# Patient Record
Sex: Female | Born: 1945
Health system: Southern US, Community
[De-identification: ages and names within clinical notes are randomized; demographics above are authoritative.]

## PROBLEM LIST (undated history)

## (undated) DIAGNOSIS — I10 Essential (primary) hypertension: Secondary | ICD-10-CM

## (undated) DIAGNOSIS — T7840XA Allergy, unspecified, initial encounter: Secondary | ICD-10-CM

## (undated) DIAGNOSIS — G709 Myoneural disorder, unspecified: Secondary | ICD-10-CM

## (undated) DIAGNOSIS — M4800 Spinal stenosis, site unspecified: Secondary | ICD-10-CM

## (undated) DIAGNOSIS — J45909 Unspecified asthma, uncomplicated: Secondary | ICD-10-CM

## (undated) DIAGNOSIS — J449 Chronic obstructive pulmonary disease, unspecified: Secondary | ICD-10-CM

## (undated) DIAGNOSIS — J189 Pneumonia, unspecified organism: Secondary | ICD-10-CM

## (undated) DIAGNOSIS — N200 Calculus of kidney: Secondary | ICD-10-CM

## (undated) HISTORY — PX: BLADDER SUSPENSION: SHX72

## (undated) HISTORY — PX: ABDOMINAL HYSTERECTOMY: SHX81

## (undated) HISTORY — PX: SALPINGOOPHORECTOMY: SHX82

## (undated) HISTORY — DX: Allergy, unspecified, initial encounter: T78.40XA

## (undated) HISTORY — PX: BACK SURGERY: SHX140

## (undated) HISTORY — PX: TONSILLECTOMY: SUR1361

---

## 1999-01-11 ENCOUNTER — Other Ambulatory Visit: Admission: RE | Admit: 1999-01-11 | Discharge: 1999-01-11 | Payer: Self-pay | Admitting: Obstetrics and Gynecology

## 1999-03-22 ENCOUNTER — Other Ambulatory Visit: Admission: RE | Admit: 1999-03-22 | Discharge: 1999-03-22 | Payer: Self-pay | Admitting: Obstetrics and Gynecology

## 1999-07-07 ENCOUNTER — Encounter: Payer: Self-pay | Admitting: Urology

## 1999-07-07 ENCOUNTER — Ambulatory Visit (HOSPITAL_COMMUNITY): Admission: RE | Admit: 1999-07-07 | Discharge: 1999-07-08 | Payer: Self-pay | Admitting: Urology

## 2001-01-10 ENCOUNTER — Encounter: Payer: Self-pay | Admitting: Obstetrics and Gynecology

## 2001-01-10 ENCOUNTER — Encounter: Admission: RE | Admit: 2001-01-10 | Discharge: 2001-01-10 | Payer: Self-pay | Admitting: Obstetrics and Gynecology

## 2001-12-11 ENCOUNTER — Encounter: Admission: RE | Admit: 2001-12-11 | Discharge: 2001-12-11 | Payer: Self-pay | Admitting: Internal Medicine

## 2001-12-11 ENCOUNTER — Encounter: Payer: Self-pay | Admitting: Internal Medicine

## 2001-12-17 ENCOUNTER — Encounter: Admission: RE | Admit: 2001-12-17 | Discharge: 2001-12-17 | Payer: Self-pay | Admitting: Internal Medicine

## 2001-12-17 ENCOUNTER — Encounter: Payer: Self-pay | Admitting: Internal Medicine

## 2002-01-28 ENCOUNTER — Encounter: Payer: Self-pay | Admitting: Obstetrics and Gynecology

## 2002-01-28 ENCOUNTER — Ambulatory Visit (HOSPITAL_COMMUNITY): Admission: RE | Admit: 2002-01-28 | Discharge: 2002-01-28 | Payer: Self-pay | Admitting: Obstetrics and Gynecology

## 2002-03-05 ENCOUNTER — Ambulatory Visit (HOSPITAL_COMMUNITY): Admission: RE | Admit: 2002-03-05 | Discharge: 2002-03-05 | Payer: Self-pay | Admitting: Internal Medicine

## 2002-03-11 ENCOUNTER — Encounter (INDEPENDENT_AMBULATORY_CARE_PROVIDER_SITE_OTHER): Payer: Self-pay | Admitting: Specialist

## 2002-03-11 ENCOUNTER — Observation Stay (HOSPITAL_COMMUNITY): Admission: RE | Admit: 2002-03-11 | Discharge: 2002-03-13 | Payer: Self-pay | Admitting: Obstetrics and Gynecology

## 2002-03-12 ENCOUNTER — Encounter (INDEPENDENT_AMBULATORY_CARE_PROVIDER_SITE_OTHER): Payer: Self-pay | Admitting: Specialist

## 2003-02-05 ENCOUNTER — Encounter: Admission: RE | Admit: 2003-02-05 | Discharge: 2003-02-05 | Payer: Self-pay | Admitting: Obstetrics and Gynecology

## 2003-02-12 ENCOUNTER — Encounter: Admission: RE | Admit: 2003-02-12 | Discharge: 2003-02-12 | Payer: Self-pay | Admitting: Internal Medicine

## 2003-03-10 ENCOUNTER — Encounter: Admission: RE | Admit: 2003-03-10 | Discharge: 2003-03-10 | Payer: Self-pay | Admitting: Urology

## 2003-03-12 ENCOUNTER — Ambulatory Visit (HOSPITAL_BASED_OUTPATIENT_CLINIC_OR_DEPARTMENT_OTHER): Admission: RE | Admit: 2003-03-12 | Discharge: 2003-03-12 | Payer: Self-pay | Admitting: Urology

## 2003-03-12 ENCOUNTER — Ambulatory Visit (HOSPITAL_COMMUNITY): Admission: RE | Admit: 2003-03-12 | Discharge: 2003-03-12 | Payer: Self-pay | Admitting: Urology

## 2004-01-13 ENCOUNTER — Other Ambulatory Visit: Admission: RE | Admit: 2004-01-13 | Discharge: 2004-01-13 | Payer: Self-pay | Admitting: Obstetrics and Gynecology

## 2004-02-24 ENCOUNTER — Encounter: Admission: RE | Admit: 2004-02-24 | Discharge: 2004-02-24 | Payer: Self-pay | Admitting: Internal Medicine

## 2005-03-09 ENCOUNTER — Other Ambulatory Visit: Admission: RE | Admit: 2005-03-09 | Discharge: 2005-03-09 | Payer: Self-pay | Admitting: Obstetrics and Gynecology

## 2005-04-27 ENCOUNTER — Encounter: Admission: RE | Admit: 2005-04-27 | Discharge: 2005-04-27 | Payer: Self-pay | Admitting: Obstetrics and Gynecology

## 2006-02-08 ENCOUNTER — Ambulatory Visit (HOSPITAL_BASED_OUTPATIENT_CLINIC_OR_DEPARTMENT_OTHER): Admission: RE | Admit: 2006-02-08 | Discharge: 2006-02-08 | Payer: Self-pay | Admitting: Otolaryngology

## 2006-03-14 ENCOUNTER — Other Ambulatory Visit: Admission: RE | Admit: 2006-03-14 | Discharge: 2006-03-14 | Payer: Self-pay | Admitting: Obstetrics and Gynecology

## 2006-05-02 ENCOUNTER — Encounter: Admission: RE | Admit: 2006-05-02 | Discharge: 2006-05-02 | Payer: Self-pay | Admitting: Obstetrics and Gynecology

## 2006-05-16 ENCOUNTER — Encounter: Admission: RE | Admit: 2006-05-16 | Discharge: 2006-05-16 | Payer: Self-pay | Admitting: Obstetrics and Gynecology

## 2006-09-27 ENCOUNTER — Encounter: Admission: RE | Admit: 2006-09-27 | Discharge: 2006-09-27 | Payer: Self-pay | Admitting: Internal Medicine

## 2006-10-26 ENCOUNTER — Encounter: Admission: RE | Admit: 2006-10-26 | Discharge: 2006-10-26 | Payer: Self-pay | Admitting: Internal Medicine

## 2007-03-27 ENCOUNTER — Other Ambulatory Visit: Admission: RE | Admit: 2007-03-27 | Discharge: 2007-03-27 | Payer: Self-pay | Admitting: Obstetrics and Gynecology

## 2007-05-15 ENCOUNTER — Encounter: Admission: RE | Admit: 2007-05-15 | Discharge: 2007-05-15 | Payer: Self-pay | Admitting: Obstetrics and Gynecology

## 2008-01-19 ENCOUNTER — Encounter: Admission: RE | Admit: 2008-01-19 | Discharge: 2008-01-19 | Payer: Self-pay | Admitting: Neurosurgery

## 2008-04-01 ENCOUNTER — Other Ambulatory Visit: Admission: RE | Admit: 2008-04-01 | Discharge: 2008-04-01 | Payer: Self-pay | Admitting: Obstetrics and Gynecology

## 2008-06-05 ENCOUNTER — Encounter: Admission: RE | Admit: 2008-06-05 | Discharge: 2008-06-05 | Payer: Self-pay | Admitting: Family Medicine

## 2008-06-16 ENCOUNTER — Encounter: Admission: RE | Admit: 2008-06-16 | Discharge: 2008-06-16 | Payer: Self-pay | Admitting: Family Medicine

## 2008-08-05 ENCOUNTER — Ambulatory Visit (HOSPITAL_BASED_OUTPATIENT_CLINIC_OR_DEPARTMENT_OTHER): Admission: RE | Admit: 2008-08-05 | Discharge: 2008-08-05 | Payer: Self-pay | Admitting: Family Medicine

## 2008-08-05 ENCOUNTER — Ambulatory Visit: Payer: Self-pay | Admitting: Diagnostic Radiology

## 2009-07-14 ENCOUNTER — Encounter: Admission: RE | Admit: 2009-07-14 | Discharge: 2009-07-14 | Payer: Self-pay | Admitting: Family Medicine

## 2010-04-07 ENCOUNTER — Other Ambulatory Visit
Admission: RE | Admit: 2010-04-07 | Discharge: 2010-04-07 | Payer: Self-pay | Source: Home / Self Care | Admitting: Obstetrics and Gynecology

## 2010-04-16 ENCOUNTER — Encounter: Payer: Self-pay | Admitting: Internal Medicine

## 2010-04-17 ENCOUNTER — Encounter: Payer: Self-pay | Admitting: Obstetrics and Gynecology

## 2010-04-17 ENCOUNTER — Encounter: Payer: Self-pay | Admitting: Family Medicine

## 2010-06-16 ENCOUNTER — Other Ambulatory Visit: Payer: Self-pay | Admitting: Family Medicine

## 2010-06-16 DIAGNOSIS — Z1231 Encounter for screening mammogram for malignant neoplasm of breast: Secondary | ICD-10-CM

## 2010-07-20 ENCOUNTER — Ambulatory Visit: Payer: Self-pay

## 2010-08-12 NOTE — Op Note (Signed)
NAME:  Joanne Holmes, Joanne Holmes                       ACCOUNT NO.:  [id number]   MEDICAL RECORD NO.:  [id number]                   PATIENT TYPE:  INP   LOCATION:  0009                                 FACILITY:  [facility]   PHYSICIAN:  Sander Radon, M.D.                 DATE OF BIRTH:  1945-12-26   DATE OF PROCEDURE:  03/11/2002  DATE OF DISCHARGE:                                 OPERATIVE REPORT   PREOPERATIVE DIAGNOSES:  Right complex and cystic ovarian mass with a normal  CA 125 which was found on repeat  ultrasound to be slightly smaller.   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 Extensive pelvic and peritoneal adhesions and extensive  adhesions of the omentum to the bladder.   SURGEON:  Sander Radon, M.D.   ASSISTANT:  Deidre Ala, M.D.   ESTIMATED BLOOD LOSS:  250 cc   DRAINS:  Foley.   ANESTHESIA:  General endotracheal.   COMPLICATIONS:  None.   FLUIDS:  3000 cc of crystalloid and 400 mg IV Tequin preoperative antibiotic  per Dr. Virgina Jock.   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   DESCRIPTION OF PROCEDURE:  The patient was brought to the operating room and  placed on the operating room table. After induction of adequate general  endotracheal anesthesia, the patient was placed in the supine position and  prepped and draped in the usual sterile fashion, a Foley catheter was  placed. A Pfannenstiel incision was made and carried down to the fascia. I  was unable to use the patient's previous incision as it was just above the  pubic symphysis and too low for the surgery. The incision was carried down  to the fascia. The fascia was scored in the midline and extended bilaterally  using Mayo scissors. It was then separated free from the underlying muscles.  The muscles  appeared to have been tied together in the midline at the  patient's hysterectomy and the muscles were gently separated to the  symphysis. The patient was subsequently placed in Trendelenburg and the  peritoneum was entered very carefully and tediously taking care to avoid  bowel or other abdominal contents. The peritoneal incision was extended  superiorly and then inferiorly down to the bladder edge using very careful  sharp dissection. There were noted to be extensive omental adhesions to the  peritoneal surfaces tracking over to the lateral sidewalls and tracking down  into the pelvis. The patient did give a history only at her preop visit of  having some type of bladder tumor which was found at the time of her  hysterectomy. Subsequently the washings were taken the peritoneal surfaces  were noted to be smooth. There were no enlarged pelvic repair or  aortic  nodes. The O'Connor-O'Sullivan retractor was placed, the bladder blade was  placed and the bowel was packed out of the operative field. The right ovary  was noted to be densely adhesed to the pelvic sidewall and to the omental  adhesions and very careful lysis of adhesions was performed. The ovary was  subsequently mobilized and we were able to isolate the infundibulopelvic  ligament. The ovary was clamped at the infundibulopelvic ligament very close  to the ovary well away from any area of the ureter. It was then separated  from the infundibulopelvic ligament, tied with a free tie and a suture  ligature of #0 Vicryl. Excellent hemostasis was noted at this pedicle and at  all surfaces where the ovary had been mobilized using both sharp and blunt  dissection. It was then sent for frozen section and frozen section returned  as a [name] tumor. It should be noted that the right tube was noted to be  normal and sent with the ovary. The left ovary was identified and this took  very careful and tedious dissection such that we had to go  around the ovary  until adhesions but subsequently were able to isolate the very small ovary.  It too was adhesed and using sharp and blunt dissection was mobilized. To  mobilize both tubes and ovaries required very tedious and careful  dissection. Subsequently the infundibulopelvic ligament on the left was  isolated, clamped with a curved Heaney, cut, tied with a free tie and a  suture ligature of #0 Monocryl. There was noted to be some generalized  oozing across the peritoneal and omental surfaces and excellent hemostasis  was achieved using cautery. The pelvis was copiously irrigated with warm  lactated Ringer's and the infundibulopelvic ligament as well as areas from  which the ovaries had been mobilized from the peritoneal surfaces are noted  to be extremely hemostatic. This appeared to be over the omental adhesed  area and excellent hemostasis was achieved using cautery. Great care was  taken to make sure that cautery was well away from any bowel loops. There  was noted to be one area where the fascia had a small rent in it at the left  distal pubic region and this was repaired using a figure-of-eight suture of  #0 Vicryl. A bladder retractor was noted to have caused a small rent in the  peritoneal surface of the bladder but not into any other portion of the  bladder just your one superficial layer and I approximated this using a  simple running interlocking suture of 2-0 Vicryl. It should be noted that  very careful inspection revealed that there had been no compromise of the  bladder integrity and in fact great care was taken to ensure that no portion  of the bladder was entered. Again the pelvis was irrigated copiously with  warm lactated Ringers, we inspected all the pedicles again and they were  noted to be extremely hemostatic. In fact, the irrigant was noted to be very  clear and excellent hemostasis was noted even from the raw surfaces. At that point, the patient was taken out  of Trendelenburg, Kelly clamps were placed  on the peritoneum after the O'Connor-O'Sullivan retractor, bladder blade and  abdominal wall retractor as well as bowel packs were removed. The peritoneum  was closed using a simple running suture of 2-0 Vicryl. Excellent closure  was noted. Subsequently, the fascia was closed using two sutures of #0 PDS  with each suture anchored  at either angle incision, run to the midline and  tied. Excellent fascial closure was assured. The subcutaneous tissue was  irrigated copiously with warm lactated Ringers and excellent hemostasis was  achieved using cautery. The skin was closed with staples and Steri-Strips  were applied. The patient tolerated the procedure well without apparent  complications and was transferred to the recovery room in stable condition  after all instrument, sponge, and needle counts were correct.                                               Sander Radon, M.D.    DC/MEDQ  D:  03/11/2002  T:  03/12/2002  Job:  [id number]   cc:   Deidre Ala, M.D.  [address]  [place]  [place] [address]  Fax: [phone]   Precious Reel, M.D.  [address]  [place]  [place] [address]  Fax: [phone]

## 2010-08-12 NOTE — H&P (Signed)
NAME:  Joanne Holmes, Joanne Holmes                       ACCOUNT NO.:  [id number]   MEDICAL RECORD NO.:  [id number]                   PATIENT TYPE:   LOCATION:                                       FACILITY:   PHYSICIAN:  Sander Radon, M.D.                 DATE OF BIRTH:  [birth year]   DATE OF ADMISSION:  03/11/2002  DATE OF DISCHARGE:                                HISTORY & PHYSICAL   HISTORY OF PRESENT ILLNESS:  The patient is a 65 year old, Caucasian female,  G1, P1 who was found to have a complex right adnexal mass.  Evidently, she  was being worked up by Dr. Virgina Jock for severe uncontrolled hypertension.  She  had a CT angiography of the abdomen which showed no evidence of renal artery  stenosis.  However, the right ovary was found to be enlarged and completely  visualized on the exam.  Subsequently, she underwent a pelvic ultrasound  which showed the right ovary to be lateral and anterior.  It had a complex  cystic lesion measuring 5.1 x 3.9 x 5.3 cm.  There is a solid-appearing  component to the lesion medially.  The left ovary could not be identified  transabdominally.  The right ovary and cystic lesion could not be seen  vaginally.  Subsequently, the patient had a CA-125 which came back negative  at 16 and a followup ultrasound showed that neither ovary could be  visualized by transvaginal scanning, but left ovary could also not be seen  by transabdominal route.  In the right adnexa, the complex cystic mass  persisted, but it was slightly smaller measuring 3.5 x 3.9 x 3.1 cm with a  cystic as well solid component.  The patient adamantly wants to have this  removed.  It did consult Dr. Threasa Heads who agreed that I could  just go ahead and remove this with an assistant an he would be on standby in  case there is a need for cancer surgery.  We will send this as a frozen  section.  Risks of surgery including anesthetic complication, hemorrhage,  infection, damage to adjacent  structures including bladder, bowel, blood  vessels and the ureter were discussed with the patient.  She was made aware  that there is increased risk to have damage to adjacent structures due to  her previous surgeries.  Postoperatively, she was made aware of risks of  wound infection, urinary tract infection, atelectasis, PE which could result  in stroke or pulmonary embolism as well as other unforeseen risks.  She  expresses understanding of and acceptance of these risks and desires to  proceed with surgery.   PAST GYNECOLOGIC HISTORY:  Menarche at age 50.  The patient did have a  hysterectomy in the 1980s.  She also said that she had some type of bladder  cyst which was discovered at the same time.   PAST MEDICAL HISTORY:  1. Hypertension.  2. Hypercholesterolemia.  3. Mitral valve prolapse.  4. History of abnormal Pap smear.  Most recent Pap smear on January 04, 2002, was within normal limits.   MEDICATIONS:  1. Claritin 10 mg daily.  2. Zetia 10 mg q.d. for hypercholesterolemia.  3. Diovan HCT 160/12.5 q.d.  4. Atenolol 50 mg q.d.  5. Clonidine 0.1 mg b.i.d.   ALLERGIES:  PENICILLIN causes itching and shortness of breath.  ERYTHROMYCIN  causes a rash.  CEFZIL causes itching.  GRISEOFULVIN causes edema.   PAST SURGICAL HISTORY:  1. Total abdominal hysterectomy at the age of 9.  2. Lumbar laminectomy in the 1980s.  3. Bladder repair in 2002.  4. Another lumbar laminectomy in the 1980s.   FAMILY HISTORY:  No family history of colon, breast, ovarian or prostate  cancer.  The patient's mother died at age 51 of coronary artery disease and  CHF.  She also had diabetes and hypertension.  Her father died at 24 of  lymphoid sarcoma.  One brother is 55 with hypertension and diabetes.  One  sister age 46 with rheumatoid arthritis.  One adult daughter alive and well.   SOCIAL HISTORY:  The patient works for [employer].  The patient is a  reformed smoker.  No alcohol use.    REVIEW OF SYMPTOMS:  Noncontributory, except as noted above.  Denies  headaches, visual changes, chest pain, shortness of breath, abdominal pain,  change in bowel habits, unintentional weight loss, dysuria, urgency,  frequency, vaginal pruritus or discharge, pain or bleeding with intercourse.   PHYSICAL EXAMINATION:  GENERAL:  Well-developed, Caucasian female.  VITAL SIGNS:  Blood pressure 126/70, heart rate 80, weight 177 pounds.  HEENT:  Normal.  NECK:  Supple without thyromegaly, adenopathy or nodules.  CHEST:  Clear to auscultation.  BREASTS:  Symmetrical without masses or nipple discharge.  CARDIAC:  Regular rate and rhythm without extra sounds or murmurs.  ABDOMEN:  Soft, nontender.  No hepatosplenomegaly or masses.  EXTREMITIES:  No clubbing, cyanosis or edema.  NEUROLOGIC:  Oriented x3 and grossly normal.  PELVIC:  Normal external female genitalia.  No vulvar or vaginal lesions.  Cervix and uterus are surgically absent.  Bimanual exam reveals there to be  adnexal mass or tenderness.  The mass could not be palpated.  RECTAL:  Excellent sphincter tone.  Confirms pelvic exam.   IMPRESSION:  The patient is a 65 year old, Caucasian female with an ovarian  neoplasm of certain behavior to 0000000.   PLAN:  The patient is admitted for exploratory laparotomy, bilateral salingo-  oophorectomy.  The patient does understand that we may not be able to  identify her left ovary and thus this may just be an RSO.  The patient  adamantly wants to have the surgery even though the mass has decreased  minimally in size, but because it is complex, cystic and solid, it is  concerning.  I have made her aware that even though the CA-125 is normal it  does not mean this is not cancer.  The patient does express understanding of  and acceptance of these risks.  I did obtain surgical clearance for the patient from her internist, Dr. Virgina Jock at [facility].  Sander Radon, M.D.    DC/MEDQ  D:  03/11/2002  T:  03/11/2002  Job:  [id number]

## 2010-08-12 NOTE — Op Note (Signed)
[name], Joanne Holmes             ACCOUNT NO.:  [id number]   MEDICAL RECORD NO.:  [id number]          PATIENT TYPE:  AMB   LOCATION:  [place]                          FACILITY:  [facility]   PHYSICIAN:  Melissa Montane, M.D.       DATE OF BIRTH:  June 30, 1945   DATE OF PROCEDURE:  02/08/2006  DATE OF DISCHARGE:                                 OPERATIVE REPORT   PREOPERATIVE DIAGNOSES:  1. Deviated septum.  2. Turbinate hypertrophy.   POSTOPERATIVE DIAGNOSES:  1. Deviated septum.  2. Turbinate hypertrophy.   SURGICAL PROCEDURE:  Septoplasty and submucous resection of inferior  turbinates.   ANESTHESIA:  General.   ESTIMATED BLOOD LOSS:  Less than 10 mL.   INDICATIONS:  This is a 65 year old who has had chronic nasal obstruction  and congestion.  She has significant issues and has failed medical therapy.  She was informed of the risks and benefits of the procedure, including  bleeding, infection, perforation of the septum, change in the initial  appearance of the nose, chronic crusting and drying, numbness of the teeth,  and scarring, and risk of the anesthetic.  All questions were answered, and  consent was obtained.   OPERATION:  The patient was taken to the operating room and placed in the  supine position.  After adequate general endotracheal anesthesia, was placed  in the supine position, prepped and draped in the usual sterile manner.  The  oxymetazoline pledgets were placed, and the septum was injected with 1%  lidocaine with 1:100,000 epinephrine.  The septum and inferior turbinates  were injected.  The left hemitransfixion incision was performed, raising the  mucoperichondrial and osteal flap.  The cartilage was divided about 2 cm  posterior to the caudal strut, and this deviated portion of the cartilage  was removed, along with an inferior-based spur using the Jansen-Middleton  forceps and freer.  This corrected the septal deflection.  The turbinates  were in-fractured, midline  incision made with a 15-blade, mucosal flap  elevated superiorly, and the inferior mucosa and bone were removed with the  turbinate scissors.  The edge was cauterized with suction cautery, and both  flaps were laid back down over the raw surface and outfractured.  The septum  was closed with interrupted 4-0 chromic and a 4-0 plain gut quilting stitch  placed to the septum.  The nasopharynx was suctioned out of all blood and  debris.  Telfa soaked in bacitracin was placed into the nose bilaterally and  secured with a 3-0 nylon.  The oral cavity, oropharynx was suctioned out of  all blood and debris.  The patient was awakened and brought to Recovery in  stable condition.  Counts correct.          ______________________________  Melissa Montane, M.D.    JB/MEDQ  D:  02/08/2006  T:  02/09/2006  Job:  [id number]

## 2010-08-12 NOTE — Op Note (Signed)
NAME:  Joanne Holmes, Joanne Holmes                       ACCOUNT NO.:  [id number]   MEDICAL RECORD NO.:  [id number]                   PATIENT TYPE:  AMB   LOCATION:  [facility]                                 FACILITY:  [facility]   PHYSICIAN:  Marshall Cork. Jeffie Pollock, M.D.                 DATE OF BIRTH:  10-11-1945   DATE OF PROCEDURE:  03/12/2003  DATE OF DISCHARGE:                                 OPERATIVE REPORT   PROCEDURE:  Transobturator sling with [place] OB tape.   PREOPERATIVE DIAGNOSIS:  Recurrent stress incontinence.   POSTOPERATIVE DIAGNOSIS:  Recurrent stress incontinence.   SURGEON:  Marshall Cork. Jeffie Pollock, M.D.   ASSISTANTPierre Bali I. Gaynelle Arabian, M.D.   ANESTHESIA:  General.   COMPLICATIONS:  None.   INDICATIONS:  Joanne Holmes is a 65 year old white female, who underwent  dermatrix sling for stress incontinence in 2002.  She has had recurrent  incontinence and now is to undergo a transobturator sling.   FINDINGS AND PROCEDURE:  The patient was taken to the operating room after  receiving p.o. Cipro.  A general anesthetic was induced.  She was placed in  the lithotomy position.  Her perineum, genitalia, and lower abdomen were  prepped with Betadine solution after shaving.  She was draped in the usual  sterile fashion.  She has asked that a suture knot from her prior sling be  removed.  A small incision was made over the very superficial knot in the  suprapubic area, and the suture material was grasped with a hemostat and  removed with sharp dissection, removing the majority of the suture.  This  wound was later closed with Steri-Strips.  We then turned our attention to  the transobturator sling.  A Foley catheter was inserted; the bladder was  drained.  The anterior vaginal wall over the mid urethra was infiltrated  with about 10 mL of 1% lidocaine with epinephrine, and a midline incision  was made for approximately 3 cm in length.  This was carried down to the  pubourethral fascia, and the mucosa  was then elevated laterally for  approximately 2 cm to allow placement of index finger on each side.  At this  point, the lateral incisions were marked 5 cm lateral to the clitoris on  each side.  A stab wound was made on the left through the marked incision  site, and the C needle was then passed through the incision until the bone  was encountered.  It was then walked laterally until the edge of the bone  was identified and passed through the obturator space down onto a finger in  the vaginal incision on the left.  The needle was then pushed through into  the vaginal vault under finger guidance.  At this point, the end of the OB  tape was placed through the eyes of the needle and drawn back into the  perilabial incision.  A stab wound  was then made on the right, and the C  needle was then passed in an identical fashion down to the bone, walking it  to the lateral margin and through the obturator space to the finger within  the right vaginal incision.  The needle was then passed in the vaginal vault  where the other end of the tape was gauged and drawn back into the  perilabial incision.  At this point, the Foley was removed, and cystoscopy  was performed with a 22 [place] scope and the 70 degree lens.  Examination  revealed no evidence of bladder wall injury.  The bladder was left full.  The sling was then tensioned over a right-angle clamp, allowing persistent  flow of urine with pressure on a full bladder as was appropriate.  At this  point, the Foley catheter was reinserted, and the bladder was drained.  The  residual ends of the OB tape were removed, allowing the ends to fall back  into the subcutaneous tissue.  The vaginal wall was then closed in two  layers using horizontal mattress in the deep vaginal wall, followed by a  running locked 3-0 Vicryl in the vaginal mucosa.  Once the incision had been  closed, the Foley catheter was removed.  The perilabial incisions were  closed with  Dermabond, and the suprapubic incision was closed with Steri-  Strips.  The patient was taken down from lithotomy position; her anesthetic  was reversed.  She was removed to the recovery room in stable condition.  There were no complications.                                               Marshall Cork. Jeffie Pollock, M.D.    JJW/MEDQ  D:  03/12/2003  T:  03/12/2003  Job:  [id number]   cc:   Sander Radon, M.D.  [place] [place]  [place]  [place] [address]  Fax: [phone]

## 2010-08-12 NOTE — Discharge Summary (Signed)
NAME:  Joanne Holmes, Joanne Holmes                       ACCOUNT NO.:  [id number]   MEDICAL RECORD NO.:  [id number]                   PATIENT TYPE:  INP   LOCATION:  [id number]                                 FACILITY:  [facility]   PHYSICIAN:  Sander Radon, M.D.                 DATE OF BIRTH:  1945/05/05   DATE OF ADMISSION:  03/11/2002  DATE OF DISCHARGE:  03/13/2002                                 DISCHARGE SUMMARY   HISTORY OF PRESENT ILLNESS:  The patient is a 65 year old Caucasian female,  gravida 1, para 1, found to have a complex right adnexal mass in the process  of being worked up for severe uncontrolled hypertension by Dr. Virgina Jock.  This  was noted to be both solid and cystic appearing, and the patient did have a  CA-125 which was negative at 16.  Her case was discussed with Dr. Fermin Schwab, who agreed to be on stand-by for this procedure.  This was found to  be a malignant carcinoma at the time of surgery.  For further details,  please see the extensive history and physical.   HOSPITAL COURSE:  The patient was admitted and subsequently underwent  exploratory laparotomy, bilateral salpingo-oophorectomy, and extensive lysis  of adhesions.  She was found to have very extensive adhesions of the omentum  to the lateral peritoneal and pelvic side walls, making this a very tedious  and difficult surgery.  However, both ovaries and tubes were identified and  removed, and the frozen section revealed the right adnexal mass to be a  Joanne Holmes tumor.  Postoperatively, the patient's blood pressure was noted to  be 140/60.  She was receiving adequate analgesia.  She had previously  elevated blood pressures which kept her in PACU overnight under the watchful  eye of the anesthesiologist.  On postoperative day #1, her incision was  clean, dry, and intact.  Postoperative hemoglobin was noted to be 11.1, and  she did have a 12-lead EKG which was normal.  Subsequently on postoperative  day #2, she was found  to be having return of bowel function with passage of  flatus, was able to tolerate her regular diet, and was discharged home.  Urged to return within a week to have her staples removed.  She was given a  prescription for Tylox dispense #30 to take one or two p.o. q.3-4h. p.r.n.  She was also managed during his hospitalization by Dr. Virgina Jock for her severe  hypertension.  For further details regarding her surgery, please see her  dictated operative report.  She did receive extensive discharge  instructions, and was urged to call with any problems.  Final pathology  showed right ovary and fallopian tube to be a benign Joanne Holmes tumor with  tumor on the right with a benign fallopian tube, and the left ovary and  fallopian tube showed a benign serous cyst adenoma with a benign left  fallopian tube.  She also received the pink discharge instruction sheet,  urged to take ibuprofen, in addition, her Tylox 600 to 800 mg q.6h. for the  600 mg tablets or q.8h. for 800 mg, as her primary medication for pain.  She  was urged to use milk of magnesia per bottle directions for constipation.  Her blood pressure medications were to be managed by Dr. Virgina Jock.  She is to  call with any problems.                                               Sander Radon, M.D.    DC/MEDQ  D:  03/30/2002  T:  03/30/2002  Job:  [id number]   cc:   Precious Reel, M.D.  [address]  [place]  [place] [address]  Fax: [phone]

## 2010-08-12 NOTE — Op Note (Signed)
[place]  Patient:    Joanne Holmes, Joanne Holmes                    MRN: [id number] Proc. Date: 07/07/99 Adm. Date:  [id number] Attending:  Hortencia Pilar Dictator:   Marshall Cork. Jeffie Pollock, M.D. CC:         Daleen Bo. Lyn Hollingshead, M.D.             Orlando Penner. London Pepper, M.D.                           Operative Report  PROCEDURE:  Pubovaginal sling with a matrix.  PREOPERATIVE DIAGNOSIS:  Stress incontinence.  POSTOPERATIVE DIAGNOSIS:  Stress incontinence.  SURGEON:  Dr. Irine Seal.  ANESTHESIA:  General.  DRAIN:  Suprapubic tube.  COMPLICATIONS:  None.  INDICATIONS:  Ms. Showman is a 65 year old white female with progressive severe stress incontinence with considerable vaginal irritation.  She has elected to undergo sling.  FINDINGS AND DESCRIPTION OF PROCEDURE:  The patient was taken to the operating oom where a general anesthetic was induced.  She was placed in lithotomy position. Her mons were shaved.  She was prepped with Betadine solution.  She was draped in the usual sterile fashion.  A Foley catheter was inserted and bladder was drained. A weighted vaginal retractor was placed.  The anterior vaginal wall was infiltrated with 10 cc of 1% lidocaine with epinephrine.  A transverse incision was made over the pubis in the midline, measuring approximately 3 cm, but that was spread down to the fascia with a Hemostat, and antibiotic soaked sponge with ______.  An anterior vaginal wall incision was then made in the midline over the bladder neck, and proximal urethra.  The urethropelvic and vesicopelvic fascia.  On each side the  fascia was entered at its attachment of the pubis on each side with Strully scissors.  Finger dissection was used to create a tunnel in the retropubic space on each side.  Once this dissection had been completed, a 2 x 8 cm strip of Dermatrix porcine and dermis was prepared by soaking in antibiotic solution, and  #1 Prolene stitches were placed through each end of the strip with quadruple helical bit.  Once the strip had been prepared the Rolf needle was used to draw the suspension sutures into the abdominal wound in the usual fashion.  Once the suspension sutures had been placed the fascial strip was tacked in the midline using 2-0 Vicryl. he anterior vaginal wall was then closed using a running locked 2-0 Vicryl. Cystoscopy was performed at this time.  This revealed good placement of the fascial strip at the bladder neck level.  The bladder was filled.  The patient was placed in Trendelenburg and a microvasive fader tip suprapubic tube was placed through a separate stab wound superior to the abdominal incision.  Once the tube was in place, the trocar was removed and the coil was formed and secured, and the suprapubic tube was placed to straight drainage.  With the cystoscope sheath in the urethra in the neutral position the suspension sutures were then tied first. Each was tied over a Hemostat to insure minimal tension, and then they were tied across the midline to each other.  The long ends were cut, and the knot was tucked back into the abdominal wound which was then irrigated with antibiotic solution. The abdominal wound was then closed with a running intracuticular  4-0 Vicryl stitch. The wound was reinforced with Steri-Strips.  The suprapubic tube was secured with a silk suture.  A dressing was applied.  Two inch iodoform vaginal pack was placed. The patient was taken down from the lithotomy position after removal of the drapes. Her anesthetic was reversed, and she was taken to the recovery room in stable condition.  Of note, she received p.o. Cipro preoperatively. DD:  07/07/99 TD:  07/07/99 Job: [id number]

## 2010-08-24 ENCOUNTER — Ambulatory Visit
Admission: RE | Admit: 2010-08-24 | Discharge: 2010-08-24 | Disposition: A | Discharge status: Home / Self Care | Payer: Medicare Other | Source: Ambulatory Visit | Attending: Family Medicine | Admitting: Family Medicine

## 2010-08-24 DIAGNOSIS — Z1231 Encounter for screening mammogram for malignant neoplasm of breast: Secondary | ICD-10-CM

## 2011-04-10 ENCOUNTER — Other Ambulatory Visit: Payer: Self-pay | Admitting: Neurosurgery

## 2011-04-10 DIAGNOSIS — M48061 Spinal stenosis, lumbar region without neurogenic claudication: Secondary | ICD-10-CM

## 2011-04-11 ENCOUNTER — Ambulatory Visit
Admission: RE | Admit: 2011-04-11 | Discharge: 2011-04-11 | Disposition: A | Discharge status: Home / Self Care | Payer: Medicare Other | Source: Ambulatory Visit | Attending: Neurosurgery | Admitting: Neurosurgery

## 2011-04-11 DIAGNOSIS — M48061 Spinal stenosis, lumbar region without neurogenic claudication: Secondary | ICD-10-CM

## 2011-08-13 ENCOUNTER — Telehealth: Payer: Self-pay | Admitting: Radiology

## 2011-08-13 ENCOUNTER — Ambulatory Visit (INDEPENDENT_AMBULATORY_CARE_PROVIDER_SITE_OTHER): Payer: Medicare Other | Admitting: Family Medicine

## 2011-08-13 ENCOUNTER — Ambulatory Visit: Payer: Medicare Other

## 2011-08-13 VITALS — BP 180/85 | HR 115 | Temp 98.3°F | Resp 18 | Ht 62.5 in | Wt 199.8 lb

## 2011-08-13 DIAGNOSIS — R0600 Dyspnea, unspecified: Secondary | ICD-10-CM

## 2011-08-13 DIAGNOSIS — R062 Wheezing: Secondary | ICD-10-CM

## 2011-08-13 DIAGNOSIS — R509 Fever, unspecified: Secondary | ICD-10-CM

## 2011-08-13 DIAGNOSIS — D7289 Other specified disorders of white blood cells: Secondary | ICD-10-CM

## 2011-08-13 DIAGNOSIS — R05 Cough: Secondary | ICD-10-CM

## 2011-08-13 DIAGNOSIS — J45909 Unspecified asthma, uncomplicated: Secondary | ICD-10-CM

## 2011-08-13 DIAGNOSIS — D72829 Elevated white blood cell count, unspecified: Secondary | ICD-10-CM

## 2011-08-13 DIAGNOSIS — J189 Pneumonia, unspecified organism: Secondary | ICD-10-CM

## 2011-08-13 LAB — POCT CBC
Lymph, poc: 3.1 (ref 0.6–3.4)
MCH, POC: 28.1 pg (ref 27–31.2)
MCHC: 32.5 g/dL (ref 31.8–35.4)
MCV: 86.4 fL (ref 80–97)
MID (cbc): 1.8 — AB (ref 0–0.9)
MPV: 7.9 fL (ref 0–99.8)
POC MID %: 7.6 %M (ref 0–12)
Platelet Count, POC: 651 10*3/uL — AB (ref 142–424)
WBC: 24 10*3/uL — AB (ref 4.6–10.2)

## 2011-08-13 LAB — BASIC METABOLIC PANEL
BUN: 17 mg/dL (ref 6–23)
CO2: 15 mEq/L — ABNORMAL LOW (ref 19–32)
Calcium: 10.2 mg/dL (ref 8.4–10.5)
Chloride: 103 mEq/L (ref 96–112)
Creat: 0.77 mg/dL (ref 0.50–1.10)

## 2011-08-13 MED ORDER — ALBUTEROL SULFATE (2.5 MG/3ML) 0.083% IN NEBU
2.5000 mg | INHALATION_SOLUTION | Freq: Once | RESPIRATORY_TRACT | Status: AC
  Administered 2011-08-13: 2.5 mg via RESPIRATORY_TRACT

## 2011-08-13 MED ORDER — MOXIFLOXACIN HCL 400 MG PO TABS: 400.0000 mg | ORAL_TABLET | Freq: Every day | ORAL | Status: AC

## 2011-08-13 MED ORDER — BENZONATATE 100 MG PO CAPS: 100.0000 mg | ORAL_CAPSULE | Freq: Three times a day (TID) | ORAL | Status: AC | PRN

## 2011-08-13 NOTE — Patient Instructions (Signed)
Recheck tomorrow morning with Harrison Mons, PAC.  Start antibiotic, continue nebulizer treatments up to every 6 hours as needed. Return to the clinic or go to the nearest emergency room if any of your symptoms worsen or new symptoms occur.

## 2011-08-13 NOTE — Progress Notes (Signed)
Subjective:    Patient ID: Joanne Holmes, female    DOB: [birth year], 66 y.o.   MRN: [id number]  HPI Joanne Holmes is a 66 y.o. female Wheezing for a few weeks, asthma attack with start of allergy season - current sx's worst flair.  Constant cough at night- sometimes productive.  Dark phlem, but lighter at times.    Saw family doctor - [facility] in [place] - 9 days ago.  Rx advair 250/50, and proair.  Seen again 5 days ago.  Had steroid injectoin, and steroid tablets- but did not fill these out of concern for too many steroids.  Fever at night - subjective, with sweats at night.   Saw primary doctor again 2 days ago - breathing treatment x 1 in office, and rx nebules at home - using albuterol nebs  - has taken about twice per day, Advair twice per day - but not sure if receiving full dose with dosing.  Using albuterol inhaler 3 times per day.  Told oxygen was ok at other doctor's visits. Past 2 days - feels much worse.  Last breathing treatment at 6am - minimal relief.  Hx of spinal stenosis with recent lumbar injection  No recent calf pain. No recent travel, no known hx dvt/pe.  Had spinal injection with steroid 3 days ago.    Plans on flight to [place] - for cruise in 1 week.    Review of Systems  Constitutional: Positive for fever.  Respiratory: Positive for cough, shortness of breath and wheezing.   Cardiovascular: Negative for chest pain and leg swelling.  Musculoskeletal:       No calf pain/swelling.       Objective:   Physical Exam  Constitutional: She is oriented to person, place, and time. She appears well-developed and well-nourished. No distress.  HENT:  Head: Normocephalic and atraumatic.  Right Ear: External ear normal.  Left Ear: External ear normal.  Mouth/Throat: Oropharynx is clear and moist.  Eyes: Conjunctivae and EOM are normal. Pupils are equal, round, and reactive to light.  Cardiovascular: Regular rhythm.  Tachycardia present.   Pulmonary/Chest:  Effort normal. No accessory muscle usage. No respiratory distress. She has wheezes in the right lower field and the left lower field. She has rhonchi in the left lower field. She has no rales.  Musculoskeletal:       Calves NT, negative homans  Lymphadenopathy:    She has no cervical adenopathy.  Neurological: She is alert and oriented to person, place, and time.  Skin: Skin is warm and dry.  Psychiatric: She has a normal mood and affect. Her behavior is normal.    Ambulatory pulse ox - 90% .  Albuterol neb 2.5mg  given - ambulatory pulse ox 90%,  Minimal improvement - slightly better with easier to breathe, but minimal change.  Still with few distant rhonchi RLL.  Faint end-expiratory wheeze x 1.  [facility] reading (PRIMARY) by  Dr. Carlota Raspberry:  CXR: increased RLL markings, few LL markings - possible early RLL pneumonia  Results for orders placed in visit on 08/13/11  POCT CBC      Component Value Range   WBC 24.0 (*) 4.6 - 10.2 (K/uL)   Lymph, poc 3.1  0.6 - 3.4    POC LYMPH PERCENT 12.9  10 - 50 (%L)   MID (cbc) 1.8 (*) 0 - 0.9    POC MID % 7.6  0 - 12 (%M)   POC Granulocyte 19.1 (*) 2 - 6.9  Granulocyte percent 79.5  37 - 80 (%G)   RBC 4.99  4.04 - 5.48 (M/uL)   Hemoglobin 14.0  12.2 - 16.2 (g/dL)   HCT, POC 43.1  37.7 - 47.9 (%)   MCV 86.4  80 - 97 (fL)   MCH, POC 28.1  27 - 31.2 (pg)   MCHC 32.5  31.8 - 35.4 (g/dL)   RDW, POC 13.5     Platelet Count, POC 651 (*) 142 - 424 (K/uL)   MPV 7.9  0 - 99.8 (fL)      Assessment & Plan:  Joanne Holmes is a 66 y.o. female With underlying allergic rhinitis, asthma, with multiple recent office visits with primary provider for bronchospasm/cough/asthma flair.  Interval worsening - possible early pneumonia, with marked leukocytosis, but has had recent steroid injections.  Afebrile with borderline hypoxia - ambulatory drop. Slight improvement with neb x 1. Options discussed including ER eval/inpatient treatment, but patient would like an  attempt at PO antibiotics, home nebulizer treatments and recheck in the morning. Penicillin allergic.  Plan on ER eval/911 if any increased dyspnea, fever, or other clinical worsening prior.  May need CT or inpatient eval if not improving. Start Avelox 400mg  QD, #10.  Continue albuterol nebs Q6hprn.  No Rocephin given as PCN allergic. Recheck tomorrow am with Joanne Holmes, PAC.  BMP drawn in anticipation of possible CT scan tomorrow to evaluate renal function.    1605 addendum: CXR report: IMPRESSION:  Mild patchy right middle lobe and right lower lobe opacities -  early or mild pneumonia is not excluded.    Plan on follow up tomorrow - see result note.

## 2011-08-13 NOTE — Telephone Encounter (Signed)
Message copied by Ritta Slot on Sun Aug 13, 2011  4:06 PM ------      Message from: [place], Spalding R      Created: Sun Aug 13, 2011  4:03 PM       Call patient - radiologist did see possible early Right sided pneumonia.  Take antibiotic as prescribed, and follow up with Harrison Mons in the morning. If any worsening overnight, go to the emergency room.  Copy of report to [place] as FYI.

## 2011-08-13 NOTE — Telephone Encounter (Signed)
GAVE PT MESSAGE. SHE WILL FOLLOW UP TOMORROW MORNING WITH CHELLE.

## 2011-08-14 ENCOUNTER — Ambulatory Visit (INDEPENDENT_AMBULATORY_CARE_PROVIDER_SITE_OTHER): Payer: Medicare Other | Admitting: Family Medicine

## 2011-08-14 VITALS — BP 172/78 | HR 108 | Temp 98.2°F | Resp 18 | Ht 63.0 in | Wt 199.8 lb

## 2011-08-14 DIAGNOSIS — J189 Pneumonia, unspecified organism: Secondary | ICD-10-CM

## 2011-08-14 DIAGNOSIS — R062 Wheezing: Secondary | ICD-10-CM

## 2011-08-14 LAB — POCT CBC
Lymph, poc: 3.1 (ref 0.6–3.4)
MCHC: 32.8 g/dL (ref 31.8–35.4)
MID (cbc): 1.5 — AB (ref 0–0.9)
MPV: 7.4 fL (ref 0–99.8)
POC Granulocyte: 18.9 — AB (ref 2–6.9)
POC LYMPH PERCENT: 13 %L (ref 10–50)
POC MID %: 6.4 %M (ref 0–12)
Platelet Count, POC: 647 10*3/uL — AB (ref 142–424)
RDW, POC: 14.1 %

## 2011-08-14 MED ORDER — IPRATROPIUM BROMIDE 0.02 % IN SOLN
0.5000 mg | Freq: Once | RESPIRATORY_TRACT | Status: AC
  Administered 2011-08-14: 0.5 mg via RESPIRATORY_TRACT

## 2011-08-14 MED ORDER — ALBUTEROL SULFATE (2.5 MG/3ML) 0.083% IN NEBU
2.5000 mg | INHALATION_SOLUTION | Freq: Once | RESPIRATORY_TRACT | Status: AC
  Administered 2011-08-14: 2.5 mg via RESPIRATORY_TRACT

## 2011-08-14 NOTE — Patient Instructions (Signed)
Get lots of rest and drink plenty of fluids.  Continue the current medications, using the nebulizer every 4 hours as needed for cough, shortness of breath or wheezing.

## 2011-08-14 NOTE — Progress Notes (Signed)
  Subjective:    Patient ID: Joanne Holmes, female    DOB: [birth year], 66 y.o.   MRN: [id number]  HPI Presents for 24 hour follow-up after being seen yesterday with cough and probable pneumonia.  Elevated white count thought to be due, in large part, to recent prednisone treatment.  Started Avelox yesterday.  Radiology overread confirms patchy infiltrate. Patient reports turning a corner over night, and while she's "not out of the barn yet" feels as though she can see the light. Home neb this morning about 6 am with symptom improvement.   Review of Systems     Objective:   Physical Exam Vital signs noted. Well-developed, well nourished WF who is awake, alert and oriented, in NAD. HEENT: /AT, sclera and conjunctiva are clear.  Heart: RRR, no murmur Lungs: wheezes and rhonchi both bases, L>R. Abdomen: normo-active bowel sounds, supple, non-tender, no mass or organomegaly. Extremities: no cyanosis, clubbing or edema. Skin: warm and dry without rash.  Results for orders placed in visit on 08/14/11  POCT CBC      Component Value Range   WBC 23.5 (*) down from 24 4.6 - 10.2 (K/uL)   Lymph, poc 3.1  0.6 - 3.4    POC LYMPH PERCENT 13.0  10 - 50 (%L)   MID (cbc) 1.5 (*) 0 - 0.9    POC MID % 6.4  0 - 12 (%M)   POC Granulocyte 18.9 (*) 2 - 6.9    Granulocyte percent 80.6 (*) 37 - 80 (%G)   RBC 4.77  4.04 - 5.48 (M/uL)   Hemoglobin 13.5  12.2 - 16.2 (g/dL)   HCT, POC 41.1  37.7 - 47.9 (%)   MCV 86.2  80 - 97 (fL)   MCH, POC 28.3  27 - 31.2 (pg)   MCHC 32.8  31.8 - 35.4 (g/dL)   RDW, POC 14.1     Platelet Count, POC 647 (*) 142 - 424 (K/uL)   MPV 7.4  0 - 99.8 (fL)   Albuterol + atrovent neb treatment with improvement subjectively, and decrease in wheezing on exam.      Assessment & Plan:   1. Pneumonia  POCT CBC  2. Wheezing  albuterol (PROVENTIL) (2.5 MG/3ML) 0.083% nebulizer solution 2.5 mg, ipratropium (ATROVENT) nebulizer solution 0.5 mg   Continue current treatment.   Recheck with Dr. Carlota Raspberry in 48 hours, sooner if symptoms worsen again. This patient encounter was discussed with Dr. Lorelei Pont.

## 2011-08-16 ENCOUNTER — Ambulatory Visit: Payer: Medicare Other

## 2011-08-16 ENCOUNTER — Ambulatory Visit (INDEPENDENT_AMBULATORY_CARE_PROVIDER_SITE_OTHER): Payer: Medicare Other | Admitting: Family Medicine

## 2011-08-16 ENCOUNTER — Ambulatory Visit
Admission: RE | Admit: 2011-08-16 | Discharge: 2011-08-16 | Disposition: A | Discharge status: Home / Self Care | Payer: Medicare Other | Source: Ambulatory Visit | Attending: Family Medicine | Admitting: Family Medicine

## 2011-08-16 VITALS — BP 126/82 | HR 109 | Temp 98.0°F | Resp 18 | Ht 63.0 in | Wt 197.2 lb

## 2011-08-16 DIAGNOSIS — J9801 Acute bronchospasm: Secondary | ICD-10-CM

## 2011-08-16 DIAGNOSIS — R0902 Hypoxemia: Secondary | ICD-10-CM

## 2011-08-16 DIAGNOSIS — J189 Pneumonia, unspecified organism: Secondary | ICD-10-CM

## 2011-08-16 DIAGNOSIS — E872 Acidosis: Secondary | ICD-10-CM

## 2011-08-16 LAB — POCT CBC
Hemoglobin: 13.8 g/dL (ref 12.2–16.2)
MCH, POC: 29.5 pg (ref 27–31.2)
MCV: 86.3 fL (ref 80–97)
MPV: 7.2 fL (ref 0–99.8)
POC MID %: 9.1 %M (ref 0–12)
RBC: 4.68 M/uL (ref 4.04–5.48)
WBC: 26 10*3/uL — AB (ref 4.6–10.2)

## 2011-08-16 LAB — BASIC METABOLIC PANEL
CO2: 21 mEq/L (ref 19–32)
Chloride: 100 mEq/L (ref 96–112)
Creat: 0.98 mg/dL (ref 0.50–1.10)

## 2011-08-16 MED ORDER — IOHEXOL 300 MG/ML  SOLN
75.0000 mL | Freq: Once | INTRAMUSCULAR | Status: AC | PRN
  Administered 2011-08-16: 75 mL via INTRAVENOUS

## 2011-08-16 NOTE — Progress Notes (Signed)
Subjective:    Patient ID: Joanne Holmes, female    DOB: 11-16-1945, 66 y.o.   MRN: [id number]  HPI Joanne Holmes is a 66 y.o. female Here for follow up pneumonia - see prior OV's.  Started on Avelox 400mg  QD on 08/13/11.  Also on home albuterol nebulizer treatments.  Initial WBC count 24 on 5/19.Joanne Holmes on 5/20 -symptomatically improving, and WBC 23.5.  Steroid injection 5/14 in primary care office, then had lumbar spinal injection with steroid 5/16 for spinal stenosis.  Jittery with prednisone at times, but no psychosis symptoms.  Eating/drinking ok. CXR report 08/13/11:  IMPRESSION: Mild patchy right middle lobe and right lower lobe opacities -  early or mild pneumonia is not excluded.  Today - feels much better.  No fevers recently - last one 2 nights ago - subjective only.  None yesterday or today.  Using nebulizer 4 times per day - helps some, but feels better with antibiotic, taken 3 doses Avelox. producitve mucus - initially dark, but now clearing up.  No hx CHF or known heart disease.  Hx of asthma attacks in past, but never as bad as this episode.  Review of Systems  Constitutional: Negative for fever and chills.  HENT: Positive for congestion.   Respiratory: Positive for cough. Negative for shortness of breath.   Cardiovascular: Negative for chest pain.       Objective:   Physical Exam  Constitutional: She appears well-developed and well-nourished.  HENT:  Head: Normocephalic and atraumatic.  Mouth/Throat: No oropharyngeal exudate.  Eyes: Conjunctivae are normal. Pupils are equal, round, and reactive to light.  Neck: Normal range of motion.  Cardiovascular: Normal rate, regular rhythm, normal heart sounds and intact distal pulses.   Pulmonary/Chest: Effort normal. She has no decreased breath sounds. She has wheezes. She has no rales.       Few coarse bs - LLL, RLL.  No distress.    Lymphadenopathy:    She has no cervical adenopathy.      Results for orders  placed in visit on 08/16/11  POCT CBC      Component Value Range   WBC 26.0 (*) 4.6 - 10.2 (K/uL)   Lymph, poc 3.2  0.6 - 3.4    POC LYMPH PERCENT 12.4  10 - 50 (%L)   MID (cbc) 2.4 (*) 0 - 0.9    POC MID % 9.1  0 - 12 (%M)   POC Granulocyte 20.4 (*) 2 - 6.9    Granulocyte percent 78.5  37 - 80 (%G)   RBC 4.68  4.04 - 5.48 (M/uL)   Hemoglobin 13.8  12.2 - 16.2 (g/dL)   HCT, POC 40.4  37.7 - 47.9 (%)   MCV 86.3  80 - 97 (fL)   MCH, POC 29.5  27 - 31.2 (pg)   MCHC 34.2  31.8 - 35.4 (g/dL)   RDW, POC 14.2     Platelet Count, POC 619 (*) 142 - 424 (K/uL)   MPV 7.2  0 - 99.8 (fL)   [facility] reading (PRIMARY) by  Dr. Carlota Raspberry: CXR - partial clearing of RMl/RLL markings noted on lateral..  Ambulatory pulse ox - 92%      Assessment & Plan:  Joanne Holmes is a 66 y.o. female. 1. Pneumonia  POCT CBC, DG Chest 2 View  2. Hypoxia    3. Bronchospasm    4. Acidosis     Pneumonia - symptomatically improving, afebrile, but persistently elevated leukocytosis.  Acidotic  on prior BMP (bicarb 15).  Persistent drop in ambulatory pulse ox. Recheck BMP today, check CT chest today (creatinine 0.77 3 days ago), then possible consult with pulmonary for further eval.  Continue Avelox.  Albuterol if wheezing - up to Q4-6h prn.   Recheck depending on CT.  ER/911 precautions reviewed.  Addendum 1645: CT report reviewed -  IMPRESSION:  1. Patchy peribronchial nodular densities have a tree-in-bud  appearance and are most consistent with an inflammatory process,  possibly atypical mycobacterial infection.  2. No consolidation or focal mass identified.  3. No adenopathy or pleural effusion.  4. Diffuse atherosclerosis.  Discussed CT, labs and patient's course with pulmonologist - no new treatment advised at present, but would recheck and follow current markings to resolution.  Plan on follow up for repeat CBC, pulse ox and CXR in 2 days at appointment center.  Advised that may not be able to fly  overseas based on current status.  Sooner if any worsening.

## 2011-08-18 ENCOUNTER — Ambulatory Visit (INDEPENDENT_AMBULATORY_CARE_PROVIDER_SITE_OTHER): Payer: Medicare Other | Admitting: Family Medicine

## 2011-08-18 ENCOUNTER — Ambulatory Visit: Payer: Medicare Other

## 2011-08-18 ENCOUNTER — Encounter: Payer: Self-pay | Admitting: Family Medicine

## 2011-08-18 ENCOUNTER — Other Ambulatory Visit: Payer: Self-pay | Admitting: *Deleted

## 2011-08-18 VITALS — BP 170/88 | HR 90 | Temp 97.4°F | Resp 16 | Ht 62.0 in | Wt 197.0 lb

## 2011-08-18 DIAGNOSIS — J9801 Acute bronchospasm: Secondary | ICD-10-CM

## 2011-08-18 DIAGNOSIS — R0902 Hypoxemia: Secondary | ICD-10-CM

## 2011-08-18 DIAGNOSIS — J189 Pneumonia, unspecified organism: Secondary | ICD-10-CM

## 2011-08-18 LAB — POCT CBC
HCT, POC: 41.8 % (ref 37.7–47.9)
Hemoglobin: 13.8 g/dL (ref 12.2–16.2)
Lymph, poc: 3.4 (ref 0.6–3.4)
MCH, POC: 28.4 pg (ref 27–31.2)
MCHC: 33 g/dL (ref 31.8–35.4)
MPV: 7.3 fL (ref 0–99.8)
POC LYMPH PERCENT: 26.1 %L (ref 10–50)
POC MID %: 9.1 %M (ref 0–12)
WBC: 12.9 10*3/uL — AB (ref 4.6–10.2)

## 2011-08-18 MED ORDER — MOXIFLOXACIN HCL 400 MG PO TABS: 400.0000 mg | ORAL_TABLET | Freq: Every day | ORAL | Status: AC

## 2011-08-18 MED ORDER — FLUTICASONE-SALMETEROL 250-50 MCG/DOSE IN AEPB: 1.0000 | INHALATION_SPRAY | Freq: Two times a day (BID) | RESPIRATORY_TRACT | Status: DC

## 2011-08-18 MED ORDER — ALBUTEROL SULFATE HFA 108 (90 BASE) MCG/ACT IN AERS: 2.0000 | INHALATION_SPRAY | Freq: Four times a day (QID) | RESPIRATORY_TRACT | Status: DC | PRN

## 2011-08-18 NOTE — Progress Notes (Signed)
Subjective:    Patient ID: Joanne Holmes, female    DOB: 1945-10-18, 66 y.o.   MRN: [id number]  HPI Joanne Holmes is a 66 y.o. female See prior ov's - in summary: started on Avelox 400mg  QD on 08/13/11 for RML/RLL pneumonia.  Also on home albuterol nebulizer treatments.  Initial WBC count 24 on 5/19.Joanne Holmes on 5/20 -symptomatically improving, and WBC 23.5.  Steroid injection 5/14 in primary care office, then had lumbar spinal injection with steroid 5/16 for spinal stenosis.   CXR report 08/13/11:  IMPRESSION: Mild patchy right middle lobe and right lower lobe opacities -  early or mild pneumonia is not excluded.  Pneumonia - symptomatically improving 2 days ago. Afebrile, but persistently elevated leukocytosis.  Acidotic on prior BMP (bicarb 15), but recheck wnl - suspect error from prior blood as add on test.   Persistent drop in ambulatory pulse ox to 92% last ov.  Continued Avelox and Albuterol if wheezing - up to Q4-6h prn.  CXR report - 5/22: IMPRESSION: Emphysematous and bronchitic changes consistent with COPD.  Subsegmental atelectasis left lower lobe with improving right basilar infiltrate.   CT chest 5/22: IMPRESSION:  1. Patchy peribronchial nodular densities have a tree-in-bud  appearance and are most consistent with an inflammatory process,  possibly atypical mycobacterial infection.  2. No consolidation or focal mass identified.  3. No adenopathy or pleural effusion.  4. Diffuse atherosclerosis.  Discussed CT, labs and patient's course with pulmonologist - no new treatment advised at present, but would recheck and follow current markings to resolution.  Repeat BMP 2 days ago - WNL.    Here for follow up. Feeling better every day - much better today than 2 days ago .  Sleeping through the night.  Didn't need nebulizer when 1st thing this am.  Did take one earlier this am as was scheduled to be seen today.  Ate a regular meal yesterday.  More energy - overrall feels  much better.  No shortness of breath.  Only used nebulizer twice yesterday, but not for wheeze or cough - just because it was scheduled. Has not used other inhalers. Still some nasal congestion.    Review of Systems  Constitutional: Negative for fever and chills.  HENT: Positive for congestion.   Respiratory: Positive for cough. Negative for shortness of breath.   Cardiovascular: Negative for chest pain and palpitations.  Skin: Negative for rash.  Psychiatric/Behavioral: Negative for sleep disturbance.       Objective:   Physical Exam  Constitutional: She is oriented to person, place, and time. She appears well-developed and well-nourished.  HENT:  Head: Normocephalic and atraumatic.  Right Ear: External ear normal.  Left Ear: External ear normal.  Nose: Mucosal edema present. No rhinorrhea, sinus tenderness or nasal deformity.  Mouth/Throat: Oropharynx is clear and moist. No oropharyngeal exudate.  Eyes: EOM are normal. Pupils are equal, round, and reactive to light.  Neck: Normal range of motion.  Cardiovascular: Normal rate, regular rhythm, normal heart sounds and intact distal pulses.   Pulmonary/Chest: Effort normal. No accessory muscle usage. Not tachypneic. No respiratory distress. She has no decreased breath sounds. She has wheezes in the right middle field and the left lower field. She has rhonchi in the right middle field and the left lower field. She has no rales.       Few distant wheezes and scattered coarse breath sounds -   Lymphadenopathy:    She has no cervical adenopathy.  Neurological: She  is alert and oriented to person, place, and time.  Skin: Skin is warm and dry.  Psychiatric: She has a normal mood and affect. Her behavior is normal.    [facility] reading (PRIMARY) by  Dr. Carlota Raspberry:  Stable - hyperexpanded with few increased markings rml..  Report: Findings: The heart size and mediastinal contours are within  normal limits. Both lungs are clear. Mild  hyperinflation again  noted, suspicious for COPD. The visualized skeletal structures are  unremarkable. IMPRESSION: Suspect COPD. No active cardiopulmonary disease.   CBC: WBC 12.9 (down from 26), 64%granulocytes.  HGB 13.8, platelets 606      Assessment & Plan:  Joanne Holmes is a 66 y.o. female Pneumonia with hypoxia, bronchospasm and likely copd component.  Markedly improved symptomatically and wbc improved.  Oximetry at rest 97%, low of 93 with ambulation.  No dyspnea.  Tolerating po fluids and foods.    Discussed with pulmonary - given improvement, and pulse ox at rest, ok to fly on plane. Continue Avelox course for full 14 days - additional 4 pills prescribed today. Change from nebulizer to hfa albuterol Q4-6h prn as symptomatically did not need nebulizer.   Restart Advair 250/50  1 inh BID to help with COPD component.  She has prednisone, but has not needed it yet.  Can take the pills with her (prednisone 20mg   - 3, 3, 3,2, 2, 2) and start only if wheezing and use of albuterol not continuing to improve.   Discussed relative risks of flying and travel overseas with current condition, but as markedly improved, stable resting oximetry, and as discussed with pulmonary - ok to travel tomorrow at this point.  If any worsening of condition overseas or on cruise, to be seen by medical provider.    Called patient with above - has not needed albuterol any further today.  Understanding expressed of above plan.  Plan on recheck in approximately 2-3 weeks when returns.

## 2011-08-25 ENCOUNTER — Ambulatory Visit: Payer: Medicare Other | Admitting: Family Medicine

## 2011-09-12 ENCOUNTER — Other Ambulatory Visit: Payer: Self-pay | Admitting: Obstetrics and Gynecology

## 2011-09-12 DIAGNOSIS — Z1231 Encounter for screening mammogram for malignant neoplasm of breast: Secondary | ICD-10-CM

## 2011-10-04 ENCOUNTER — Ambulatory Visit
Admission: RE | Admit: 2011-10-04 | Discharge: 2011-10-04 | Disposition: A | Discharge status: Home / Self Care | Payer: Medicare Other | Source: Ambulatory Visit | Attending: Obstetrics and Gynecology | Admitting: Obstetrics and Gynecology

## 2011-10-04 DIAGNOSIS — Z1231 Encounter for screening mammogram for malignant neoplasm of breast: Secondary | ICD-10-CM

## 2012-03-05 ENCOUNTER — Other Ambulatory Visit: Payer: Self-pay | Admitting: Orthopedic Surgery

## 2012-03-06 ENCOUNTER — Encounter (HOSPITAL_BASED_OUTPATIENT_CLINIC_OR_DEPARTMENT_OTHER): Payer: Self-pay | Admitting: *Deleted

## 2012-03-07 ENCOUNTER — Encounter (HOSPITAL_BASED_OUTPATIENT_CLINIC_OR_DEPARTMENT_OTHER)
Admission: RE | Admit: 2012-03-07 | Discharge: 2012-03-07 | Disposition: A | Discharge status: Home / Self Care | Payer: Medicare Other | Source: Ambulatory Visit | Attending: Orthopedic Surgery | Admitting: Orthopedic Surgery

## 2012-03-07 LAB — BASIC METABOLIC PANEL
CO2: 22 mEq/L (ref 19–32)
Chloride: 103 mEq/L (ref 96–112)
Creatinine, Ser: 0.88 mg/dL (ref 0.50–1.10)
Potassium: 4 mEq/L (ref 3.5–5.1)

## 2012-03-07 NOTE — H&P (Signed)
Joanne Holmes is an 66 y.o. female.   Chief Complaint: c/o chronic and progressive numbness and tingling of the left hand HPI:  Joanne Holmes is a very pleasant 66 year old woman. She is currently retired. She is right hand dominant. She has had a history of numbness in her hands particularly troublesome at night for months. She has numbness 7 out of 7 nights a week. She fumbles when she places her earrings. She cannot feel small objects. She does have a history of gout that presented in her great toe. She does not take gout suppressing medication on a regular basis. She knows she has background osteoarthritis of her hands evidenced by Heberden's and Bouchard's nodes. She has no history of diabetes or thyroid disease.    Past Medical History  Diagnosis Date  . Hypertension   . Asthma   . Pneumonia   . COPD (chronic obstructive pulmonary disease)   . Neuromuscular disorder     carpal tunnel- left    Past Surgical History  Procedure Date  . Tonsillectomy   . Abdominal hysterectomy   . Salpingoophorectomy ~8 yrs ago  . Back surgery 25+ yrs ago    lumbar laminectomy L5-S1  . Bladder suspension ~10 yrs ago    History reviewed. No pertinent family history. Social History:  reports that she quit smoking about 31 years ago. Her smoking use included Cigarettes. She has a 40 pack-year smoking history. She does not have any smokeless tobacco history on file. She reports that she does not drink alcohol or use illicit drugs.  Allergies:  Allergies  Allergen Reactions  . Ceclor (Cefaclor) Other (See Comments)    Pt does not remember  . Cefzil (Cefprozil) Other (See Comments)    Pt does not remember  . Erythromycin Itching and Rash  . Griseofulvin Swelling  . Morphine And Related Itching  . Penicillins Anaphylaxis    No prescriptions prior to admission    Results for orders placed during the hospital encounter of 03/08/12 (from the past 48 hour(s))  BASIC METABOLIC PANEL     Status:  Abnormal   Collection Time   03/07/12 11:25 AM      Component Value Range Comment   Sodium 139  135 - 145 mEq/L    Potassium 4.0  3.5 - 5.1 mEq/L    Chloride 103  96 - 112 mEq/L    CO2 22  19 - 32 mEq/L    Glucose, Bld 100 (*) 70 - 99 mg/dL    BUN 14  6 - 23 mg/dL    Creatinine, Ser 0.88  0.50 - 1.10 mg/dL    Calcium 10.0  8.4 - 10.5 mg/dL    GFR calc non Af Amer 66 (*) >90 mL/min    GFR calc Af Amer 76 (*) >90 mL/min     No results found.   Pertinent items are noted in HPI.  Height 5' 2.5" (1.588 m), weight 92.534 kg (204 lb).  General appearance: alert Head: Normocephalic, without obvious abnormality Neck: supple, symmetrical, trachea midline Resp: clear to auscultation bilaterally Cardio: regular rate and rhythm GI: normal findings: bowel sounds normal Extremities: Inspection of her hands reveals Heberden's and Bouchard's nodes. She has diminished sweat patterns in the median distribution bilaterally. She has a positive wrist flexion test essentially immediately on the left and at one minute on the right. She has all the positive provocative signs of carpal tunnel syndrome.  X-rays of her hands and wrists demonstrate osteoarthritis of the IP joints.  Her thumb CMC joints are well preserved. She has ulnar plus variant bilaterally with mild degenerative change at the distal radial ulnar joint, left worse than right Dr. Zebedee Iba  completed detailed electrodiagnostic studies. These reveal severe left carpal tunnel syndrome and mild to moderate right carpal tunnel syndrome.   Pulses: 2+ and symmetric Skin: normal Neurologic: Grossly normal    Assessment/Plan Impression: Left CTS  Plan: To the OR for left CTR.The procedure, risks,benefits and post-op course were discussed with the patient at length and they were in agreement with the plan.   DASNOIT,[name] 03/07/2012, 9:27 PM     H&P documentation: 03/08/2012  -History and Physical Reviewed  -Patient has been  re-examined  -No change in the plan of care  Cammie Sickle, MD

## 2012-03-08 ENCOUNTER — Encounter (HOSPITAL_BASED_OUTPATIENT_CLINIC_OR_DEPARTMENT_OTHER)
Admission: RE | Disposition: A | Discharge status: Home / Self Care | Payer: Self-pay | Source: Ambulatory Visit | Attending: Orthopedic Surgery

## 2012-03-08 ENCOUNTER — Encounter (HOSPITAL_BASED_OUTPATIENT_CLINIC_OR_DEPARTMENT_OTHER): Payer: Self-pay | Admitting: Anesthesiology

## 2012-03-08 ENCOUNTER — Ambulatory Visit (HOSPITAL_BASED_OUTPATIENT_CLINIC_OR_DEPARTMENT_OTHER): Payer: Medicare Other | Admitting: Anesthesiology

## 2012-03-08 ENCOUNTER — Encounter (HOSPITAL_BASED_OUTPATIENT_CLINIC_OR_DEPARTMENT_OTHER): Payer: Self-pay | Admitting: *Deleted

## 2012-03-08 ENCOUNTER — Ambulatory Visit (HOSPITAL_BASED_OUTPATIENT_CLINIC_OR_DEPARTMENT_OTHER)
Admission: RE | Admit: 2012-03-08 | Discharge: 2012-03-08 | Disposition: A | Discharge status: Home / Self Care | Payer: Medicare Other | Source: Ambulatory Visit | Attending: Orthopedic Surgery | Admitting: Orthopedic Surgery

## 2012-03-08 DIAGNOSIS — J4489 Other specified chronic obstructive pulmonary disease: Secondary | ICD-10-CM | POA: Insufficient documentation

## 2012-03-08 DIAGNOSIS — Z01812 Encounter for preprocedural laboratory examination: Secondary | ICD-10-CM | POA: Insufficient documentation

## 2012-03-08 DIAGNOSIS — J449 Chronic obstructive pulmonary disease, unspecified: Secondary | ICD-10-CM | POA: Insufficient documentation

## 2012-03-08 DIAGNOSIS — Z90710 Acquired absence of both cervix and uterus: Secondary | ICD-10-CM | POA: Insufficient documentation

## 2012-03-08 DIAGNOSIS — Z8701 Personal history of pneumonia (recurrent): Secondary | ICD-10-CM | POA: Insufficient documentation

## 2012-03-08 DIAGNOSIS — Z01810 Encounter for preprocedural cardiovascular examination: Secondary | ICD-10-CM | POA: Insufficient documentation

## 2012-03-08 DIAGNOSIS — I10 Essential (primary) hypertension: Secondary | ICD-10-CM | POA: Insufficient documentation

## 2012-03-08 DIAGNOSIS — Z880 Allergy status to penicillin: Secondary | ICD-10-CM | POA: Insufficient documentation

## 2012-03-08 DIAGNOSIS — G5600 Carpal tunnel syndrome, unspecified upper limb: Secondary | ICD-10-CM | POA: Insufficient documentation

## 2012-03-08 HISTORY — PX: CARPAL TUNNEL RELEASE: SHX101

## 2012-03-08 HISTORY — DX: Myoneural disorder, unspecified: G70.9

## 2012-03-08 HISTORY — DX: Essential (primary) hypertension: I10

## 2012-03-08 HISTORY — DX: Pneumonia, unspecified organism: J18.9

## 2012-03-08 HISTORY — DX: Chronic obstructive pulmonary disease, unspecified: J44.9

## 2012-03-08 HISTORY — DX: Unspecified asthma, uncomplicated: J45.909

## 2012-03-08 SURGERY — CARPAL TUNNEL RELEASE
Anesthesia: General | Site: Wrist | Laterality: Left | Wound class: Clean

## 2012-03-08 MED ORDER — DEXAMETHASONE SODIUM PHOSPHATE 10 MG/ML IJ SOLN
INTRAMUSCULAR | Status: DC | PRN
  Administered 2012-03-08: 10 mg via INTRAVENOUS

## 2012-03-08 MED ORDER — OXYCODONE HCL 5 MG PO TABS
5.0000 mg | ORAL_TABLET | Freq: Once | ORAL | Status: DC | PRN
Start: 2012-03-08 — End: 2012-03-08

## 2012-03-08 MED ORDER — CHLORHEXIDINE GLUCONATE 4 % EX LIQD: 60.0000 mL | Freq: Once | CUTANEOUS | Status: DC

## 2012-03-08 MED ORDER — FENTANYL CITRATE 0.05 MG/ML IJ SOLN
INTRAMUSCULAR | Status: DC | PRN
  Administered 2012-03-08 (×3): 50 ug via INTRAVENOUS

## 2012-03-08 MED ORDER — LIDOCAINE HCL (CARDIAC) 20 MG/ML IV SOLN
INTRAVENOUS | Status: DC | PRN
  Administered 2012-03-08: 50 mg via INTRAVENOUS

## 2012-03-08 MED ORDER — LABETALOL HCL 5 MG/ML IV SOLN
INTRAVENOUS | Status: DC | PRN
  Administered 2012-03-08: 5 mg via INTRAVENOUS

## 2012-03-08 MED ORDER — PROPOFOL 10 MG/ML IV BOLUS
INTRAVENOUS | Status: DC | PRN
  Administered 2012-03-08: 200 mg via INTRAVENOUS
  Administered 2012-03-08: 100 mg via INTRAVENOUS

## 2012-03-08 MED ORDER — LIDOCAINE HCL 2 % IJ SOLN
INTRAMUSCULAR | Status: DC | PRN
  Administered 2012-03-08: 4 mL

## 2012-03-08 MED ORDER — LACTATED RINGERS IV SOLN
INTRAVENOUS | Status: DC
  Administered 2012-03-08: 11:00:00 via INTRAVENOUS

## 2012-03-08 MED ORDER — OXYCODONE HCL 5 MG/5ML PO SOLN: 5.0000 mg | Freq: Once | ORAL | Status: DC | PRN

## 2012-03-08 MED ORDER — HYDROMORPHONE HCL PF 1 MG/ML IJ SOLN: 0.2500 mg | INTRAMUSCULAR | Status: DC | PRN

## 2012-03-08 MED ORDER — HYDROCODONE-ACETAMINOPHEN 5-325 MG PO TABS: ORAL_TABLET | ORAL | Status: DC

## 2012-03-08 MED ORDER — MIDAZOLAM HCL 2 MG/2ML IJ SOLN: 1.0000 mg | INTRAMUSCULAR | Status: DC | PRN

## 2012-03-08 MED ORDER — ONDANSETRON HCL 4 MG/2ML IJ SOLN
INTRAMUSCULAR | Status: DC | PRN
  Administered 2012-03-08: 4 mg via INTRAVENOUS

## 2012-03-08 MED ORDER — PROMETHAZINE HCL 25 MG/ML IJ SOLN: 6.2500 mg | INTRAMUSCULAR | Status: DC | PRN

## 2012-03-08 MED ORDER — FENTANYL CITRATE 0.05 MG/ML IJ SOLN: 50.0000 ug | Freq: Once | INTRAMUSCULAR | Status: DC

## 2012-03-08 SURGICAL SUPPLY — 37 items
BANDAGE ADHESIVE 1X3 (GAUZE/BANDAGES/DRESSINGS) IMPLANT
BANDAGE ELASTIC 3 VELCRO ST LF (GAUZE/BANDAGES/DRESSINGS) ×2 IMPLANT
BLADE SURG 15 STRL LF DISP TIS (BLADE) ×1 IMPLANT
BLADE SURG 15 STRL SS (BLADE) ×2
BNDG CMPR 9X4 STRL LF SNTH (GAUZE/BANDAGES/DRESSINGS) ×1
BNDG ESMARK 4X9 LF (GAUZE/BANDAGES/DRESSINGS) ×1 IMPLANT
BRUSH SCRUB EZ PLAIN DRY (MISCELLANEOUS) ×2 IMPLANT
CLOTH BEACON ORANGE TIMEOUT ST (SAFETY) ×2 IMPLANT
CORDS BIPOLAR (ELECTRODE) ×1 IMPLANT
COVER MAYO STAND STRL (DRAPES) ×2 IMPLANT
COVER TABLE BACK 60X90 (DRAPES) ×2 IMPLANT
CUFF TOURNIQUET SINGLE 18IN (TOURNIQUET CUFF) ×1 IMPLANT
DECANTER SPIKE VIAL GLASS SM (MISCELLANEOUS) IMPLANT
DRAPE EXTREMITY T 121X128X90 (DRAPE) ×2 IMPLANT
DRAPE SURG 17X23 STRL (DRAPES) ×2 IMPLANT
GLOVE BIO SURGEON STRL SZ 6.5 (GLOVE) ×2 IMPLANT
GLOVE BIOGEL M STRL SZ7.5 (GLOVE) ×2 IMPLANT
GLOVE ORTHO TXT STRL SZ7.5 (GLOVE) ×2 IMPLANT
GOWN PREVENTION PLUS XLARGE (GOWN DISPOSABLE) ×2 IMPLANT
GOWN PREVENTION PLUS XXLARGE (GOWN DISPOSABLE) ×4 IMPLANT
NEEDLE 27GAX1X1/2 (NEEDLE) ×1 IMPLANT
PACK BASIN DAY SURGERY FS (CUSTOM PROCEDURE TRAY) ×2 IMPLANT
PAD CAST 3X4 CTTN HI CHSV (CAST SUPPLIES) ×1 IMPLANT
PADDING CAST ABS 4INX4YD NS (CAST SUPPLIES) ×1
PADDING CAST ABS COTTON 4X4 ST (CAST SUPPLIES) ×1 IMPLANT
PADDING CAST COTTON 3X4 STRL (CAST SUPPLIES) ×2
SPLINT PLASTER CAST XFAST 3X15 (CAST SUPPLIES) ×5 IMPLANT
SPLINT PLASTER XTRA FASTSET 3X (CAST SUPPLIES) ×5
SPONGE GAUZE 4X4 12PLY (GAUZE/BANDAGES/DRESSINGS) ×2 IMPLANT
STOCKINETTE 4X48 STRL (DRAPES) ×2 IMPLANT
STRIP CLOSURE SKIN 1/2X4 (GAUZE/BANDAGES/DRESSINGS) ×2 IMPLANT
SUT PROLENE 3 0 PS 2 (SUTURE) ×2 IMPLANT
SYR 3ML 23GX1 SAFETY (SYRINGE) IMPLANT
SYR CONTROL 10ML LL (SYRINGE) ×1 IMPLANT
TRAY DSU PREP LF (CUSTOM PROCEDURE TRAY) ×2 IMPLANT
UNDERPAD 30X30 INCONTINENT (UNDERPADS AND DIAPERS) ×2 IMPLANT
WATER STERILE IRR 1000ML POUR (IV SOLUTION) ×2 IMPLANT

## 2012-03-08 NOTE — Anesthesia Procedure Notes (Signed)
Procedure Name: LMA Insertion Date/Time: 03/08/2012 11:53 AM Performed by: Erika Slaby D Pre-anesthesia Checklist: Patient identified, Emergency Drugs available, Suction available and Patient being monitored Patient Re-evaluated:Patient Re-evaluated prior to inductionOxygen Delivery Method: Circle System Utilized Preoxygenation: Pre-oxygenation with 100% oxygen Intubation Type: IV induction Ventilation: Mask ventilation without difficulty LMA: LMA inserted LMA Size: 4.0 Number of attempts: 1 Airway Equipment and Method: bite block Placement Confirmation: positive ETCO2 Tube secured with: Tape Dental Injury: Teeth and Oropharynx as per pre-operative assessment

## 2012-03-08 NOTE — Progress Notes (Signed)
glassess and upper and lower denture partials returned to patient.

## 2012-03-08 NOTE — Transfer of Care (Signed)
Immediate Anesthesia Transfer of Care Note  Patient: Joanne Holmes  Procedure(s) Performed: Procedure(s) (LRB) with comments: CARPAL TUNNEL RELEASE (Left)  Patient Location: PACU  Anesthesia Type:General  Level of Consciousness: awake and alert   Airway & Oxygen Therapy: Patient Spontanous Breathing and Patient connected to face mask oxygen  Post-op Assessment: Report given to PACU RN and Post -op Vital signs reviewed and stable  Post vital signs: Reviewed and stable  Complications: No apparent anesthesia complications

## 2012-03-08 NOTE — Op Note (Signed)
[id number] 

## 2012-03-08 NOTE — Anesthesia Preprocedure Evaluation (Signed)
Anesthesia Evaluation  Patient identified by MRN, date of birth, ID band Patient awake    Reviewed: Allergy & Precautions, H&P , NPO status , Patient's Chart, lab work & pertinent test results  Airway Mallampati: II TM Distance: >3 FB Neck ROM: Full    Dental   Pulmonary asthma , COPDformer smoker,  + rhonchi         Cardiovascular hypertension, Rate:Normal     Neuro/Psych  Neuromuscular disease    GI/Hepatic   Endo/Other    Renal/GU      Musculoskeletal   Abdominal (+) + obese,   Peds  Hematology   Anesthesia Other Findings   Reproductive/Obstetrics                           Anesthesia Physical Anesthesia Plan  ASA: III  Anesthesia Plan: General   Post-op Pain Management:    Induction: Intravenous  Airway Management Planned: LMA  Additional Equipment:   Intra-op Plan:   Post-operative Plan: Extubation in OR  Informed Consent: I have reviewed the patients History and Physical, chart, labs and discussed the procedure including the risks, benefits and alternatives for the proposed anesthesia with the patient or authorized representative who has indicated his/her understanding and acceptance.     Plan Discussed with: CRNA and Surgeon  Anesthesia Plan Comments:         Anesthesia Quick Evaluation

## 2012-03-08 NOTE — Anesthesia Postprocedure Evaluation (Signed)
  Anesthesia Post-op Note  Patient: Joanne Holmes  Procedure(s) Performed: Procedure(s) (LRB) with comments: CARPAL TUNNEL RELEASE (Left)  Patient Location: PACU  Anesthesia Type:General  Level of Consciousness: awake  Airway and Oxygen Therapy: Patient Spontanous Breathing  Post-op Pain: mild  Post-op Assessment: Post-op Vital signs reviewed, Patient's Cardiovascular Status Stable, Respiratory Function Stable, Patent Airway, No signs of Nausea or vomiting, Adequate PO intake and Pain level controlled  Post-op Vital Signs: stable  Complications: No apparent anesthesia complications

## 2012-03-08 NOTE — Brief Op Note (Signed)
03/08/2012  12:11 PM  PATIENT:  Beatrice Lecher  66 y.o. female  PRE-OPERATIVE DIAGNOSIS:  LEFT CARPAL TUNNEL SYNDROME  POST-OPERATIVE DIAGNOSIS:  left carpal tunnel syndrome  PROCEDURE:  Procedure(s) (LRB) with comments: CARPAL TUNNEL RELEASE (Left)  SURGEON:  Surgeon(s) and Role:    * Cammie Sickle., MD - Primary  PHYSICIAN ASSISTANT:   ASSISTANTS:Khalil Szczepanik Dasnoit,P.A-C   ANESTHESIA:   general  EBL: trace  BLOOD ADMINISTERED:none  DRAINS: none   LOCAL MEDICATIONS USED:  XYLOCAINE   SPECIMEN:  No Specimen  DISPOSITION OF SPECIMEN:  N/A  COUNTS:  YES  TOURNIQUET:   Total Tourniquet Time Documented: Upper Arm (Left) - 1 minutes  DICTATION: .Other Dictation: Dictation Number [id number]  PLAN OF CARE: Discharge to home after PACU  PATIENT DISPOSITION:  PACU - hemodynamically stable.

## 2012-03-11 ENCOUNTER — Encounter (HOSPITAL_BASED_OUTPATIENT_CLINIC_OR_DEPARTMENT_OTHER): Payer: Self-pay | Admitting: Orthopedic Surgery

## 2012-03-11 NOTE — Op Note (Signed)
NAME:  Joanne, Holmes                  ACCOUNT NO.:  MEDICAL RECORD NO.:  [id number]  LOCATION:                                 FACILITY:  PHYSICIAN:  Youlanda Mighty. Keishaun Hazel, M.D.      DATE OF BIRTH:  DATE OF PROCEDURE:  03/08/2012 DATE OF DISCHARGE:                              OPERATIVE REPORT   PREOPERATIVE DIAGNOSIS:  Entrapment neuropathy, median nerve, left carpal tunnel (severe).  POSTOPERATIVE DIAGNOSIS:  Entrapment neuropathy, median nerve, left carpal tunnel (severe).  OPERATION:  Release of left transverse carpal ligament.  OPERATING SURGEON:  Youlanda Mighty. Thierry Dobosz, M.D.  ASSISTANT:  Marily Lente Dasnoit, PA-C  ANESTHESIA:  General by LMA.  SUPERVISING ANESTHESIOLOGIST:  Leda Quail, M.D.  INDICATIONS:  [name] is a 66 year old right hand-dominant woman who was referred for evaluation and management of hand numbness. Clinical examination revealed signs of probable right carpal tunnel syndrome.  Electrodiagnostics were obtained by Dr. Jani Gravel, which demonstrated severe left carpal tunnel syndrome.  Due to a failure to respond to nonoperative measures, she is brought to the operating room at this time for release of her left transverse carpal ligament.  Preoperatively, she was reminded the potential risks and benefits of surgery.  Questions were invited and answered in detail.  PROCEDURE:  [name] was brought to room #1 of the [facility] and placed supine position on the operating table.  Following the induction of general anesthesia by LMA technique under Dr. Osborne Oman direct supervision, the left arm was prepped with Betadine soap and solution, sterilely draped.  A pneumatic tourniquet was applied to the proximal left brachium.  Following exsanguination of the left arm with an Esmarch bandage, the arterial tourniquet was inflated to 220 mmHg.  Due to an elevation of systolic pressure, this was subsequently increased to 250  mmHg.  Procedure commenced with a routine surgical time-out.  Thereafter, we proceeded with a 2-cm incision in the line of the ring finger in the palm.  Subcutaneous tissues were carefully divided taking care to identify the superficial palmar arch.  The distal margin of the transverse carpal ligament was identified followed by use of a Penfield 4 elevator to sound the canal and separate the median nerve proper from the transverse carpal ligament.  The ligament was released along its ulnar border extending into the distal forearm.  This widely opened the carpal canal.  No masses or other predicaments were noted.  The ulnar bursa was thickened and fibrotic.  The wound was inspected for bleeding points and the bleeding points along the margin of the released transverse carpal ligament and subdermal plexus were electrocauterized with bipolar forceps.  The wound was then repaired with intradermal 3-0 Prolene.  A compressive dressing was applied with a volar plaster splint maintaining the wrist in 15 degrees of dorsiflexion.  For aftercare, [name] is provided a prescription for Vicodin 5 mg 1 p.o. q.4-6 hours p.r.n. pain, 20 tablets without refill.     Youlanda Mighty [name], M.D.     RVS/MEDQ  D:  03/08/2012  T:  03/08/2012  Job:  [id number]

## 2012-03-18 ENCOUNTER — Observation Stay (HOSPITAL_COMMUNITY)
Admission: EM | Admit: 2012-03-18 | Discharge: 2012-03-19 | Disposition: A | Discharge status: Home / Self Care | Payer: Medicare Other | Attending: Internal Medicine | Admitting: Internal Medicine

## 2012-03-18 ENCOUNTER — Encounter (HOSPITAL_COMMUNITY): Payer: Self-pay | Admitting: Emergency Medicine

## 2012-03-18 DIAGNOSIS — R1115 Cyclical vomiting syndrome unrelated to migraine: Principal | ICD-10-CM

## 2012-03-18 DIAGNOSIS — I160 Hypertensive urgency: Secondary | ICD-10-CM

## 2012-03-18 DIAGNOSIS — E876 Hypokalemia: Secondary | ICD-10-CM

## 2012-03-18 DIAGNOSIS — I10 Essential (primary) hypertension: Secondary | ICD-10-CM

## 2012-03-18 DIAGNOSIS — I498 Other specified cardiac arrhythmias: Secondary | ICD-10-CM | POA: Insufficient documentation

## 2012-03-18 DIAGNOSIS — R9431 Abnormal electrocardiogram [ECG] [EKG]: Secondary | ICD-10-CM | POA: Insufficient documentation

## 2012-03-18 DIAGNOSIS — J4489 Other specified chronic obstructive pulmonary disease: Secondary | ICD-10-CM | POA: Insufficient documentation

## 2012-03-18 DIAGNOSIS — E860 Dehydration: Secondary | ICD-10-CM

## 2012-03-18 DIAGNOSIS — J449 Chronic obstructive pulmonary disease, unspecified: Secondary | ICD-10-CM

## 2012-03-18 LAB — CBC WITH DIFFERENTIAL/PLATELET
Basophils Relative: 0 % (ref 0–1)
Eosinophils Absolute: 0 10*3/uL (ref 0.0–0.7)
HCT: 44.2 % (ref 36.0–46.0)
Hemoglobin: 15.4 g/dL — ABNORMAL HIGH (ref 12.0–15.0)
Lymphs Abs: 0.7 10*3/uL (ref 0.7–4.0)
MCH: 28.3 pg (ref 26.0–34.0)
MCHC: 34.8 g/dL (ref 30.0–36.0)
Monocytes Absolute: 0.5 10*3/uL (ref 0.1–1.0)
Monocytes Relative: 5 % (ref 3–12)
RBC: 5.45 MIL/uL — ABNORMAL HIGH (ref 3.87–5.11)

## 2012-03-18 LAB — COMPREHENSIVE METABOLIC PANEL
Albumin: 4 g/dL (ref 3.5–5.2)
Alkaline Phosphatase: 109 U/L (ref 39–117)
BUN: 10 mg/dL (ref 6–23)
Chloride: 99 mEq/L (ref 96–112)
Creatinine, Ser: 0.75 mg/dL (ref 0.50–1.10)
GFR calc Af Amer: >90 mL/min (ref >90)
Glucose, Bld: 153 mg/dL — ABNORMAL HIGH (ref 70–99)
Total Bilirubin: 0.4 mg/dL (ref 0.3–1.2)
Total Protein: 7.7 g/dL (ref 6.0–8.3)

## 2012-03-18 LAB — URINALYSIS, ROUTINE W REFLEX MICROSCOPIC
Bilirubin Urine: NEGATIVE
Nitrite: POSITIVE — AB
Protein, ur: >300 mg/dL — AB
Specific Gravity, Urine: 1.02 (ref 1.005–1.030)
Urobilinogen, UA: 0.2 mg/dL (ref 0.0–1.0)

## 2012-03-18 LAB — LACTIC ACID, PLASMA: Lactic Acid, Venous: 1.3 mmol/L (ref 0.5–2.2)

## 2012-03-18 LAB — LIPASE, BLOOD: Lipase: 16 U/L (ref 11–59)

## 2012-03-18 LAB — URINE MICROSCOPIC-ADD ON

## 2012-03-18 MED ORDER — PROMETHAZINE HCL 25 MG/ML IJ SOLN
12.5000 mg | Freq: Once | INTRAMUSCULAR | Status: DC
  Filled 2012-03-18: qty 1

## 2012-03-18 MED ORDER — PROMETHAZINE HCL 25 MG/ML IJ SOLN
25.0000 mg | Freq: Once | INTRAMUSCULAR | Status: DC
  Filled 2012-03-18: qty 1

## 2012-03-18 MED ORDER — SODIUM CHLORIDE 0.9 % IV BOLUS (SEPSIS)
1000.0000 mL | Freq: Once | INTRAVENOUS | Status: AC
  Administered 2012-03-18: 1000 mL via INTRAVENOUS

## 2012-03-18 MED ORDER — LABETALOL HCL 5 MG/ML IV SOLN
20.0000 mg | Freq: Once | INTRAVENOUS | Status: AC
  Administered 2012-03-18: 20 mg via INTRAVENOUS
  Filled 2012-03-18: qty 4

## 2012-03-18 MED ORDER — CIPROFLOXACIN IN D5W 400 MG/200ML IV SOLN
400.0000 mg | Freq: Once | INTRAVENOUS | Status: AC
  Administered 2012-03-18: 400 mg via INTRAVENOUS
  Filled 2012-03-18: qty 200

## 2012-03-18 MED ORDER — CLONIDINE HCL 0.1 MG PO TABS
0.1000 mg | ORAL_TABLET | Freq: Three times a day (TID) | ORAL | Status: DC
  Administered 2012-03-18 – 2012-03-19 (×2): 0.1 mg via ORAL
  Filled 2012-03-18 (×4): qty 1

## 2012-03-18 MED ORDER — ONDANSETRON HCL 4 MG/2ML IJ SOLN
4.0000 mg | Freq: Once | INTRAMUSCULAR | Status: AC
  Administered 2012-03-18: 4 mg via INTRAVENOUS
  Filled 2012-03-18: qty 2

## 2012-03-18 MED ORDER — ONDANSETRON HCL 4 MG/2ML IJ SOLN
4.0000 mg | Freq: Once | INTRAMUSCULAR | Status: DC
  Filled 2012-03-18: qty 2

## 2012-03-18 MED ORDER — BENAZEPRIL HCL 40 MG PO TABS
40.0000 mg | ORAL_TABLET | Freq: Two times a day (BID) | ORAL | Status: DC
  Administered 2012-03-18 – 2012-03-19 (×2): 40 mg via ORAL
  Filled 2012-03-18 (×3): qty 1

## 2012-03-18 MED ORDER — SODIUM CHLORIDE 0.9 % IV SOLN
INTRAVENOUS | Status: DC
  Administered 2012-03-19: 01:00:00 via INTRAVENOUS
  Filled 2012-03-18 (×3): qty 1000

## 2012-03-18 MED ORDER — AMLODIPINE BESYLATE 2.5 MG PO TABS
2.5000 mg | ORAL_TABLET | Freq: Every day | ORAL | Status: DC
  Administered 2012-03-18: 2.5 mg via ORAL
  Filled 2012-03-18: qty 1

## 2012-03-18 NOTE — ED Provider Notes (Signed)
History     CSN: [id number]  Arrival date & time 03/18/12  1452   First MD Initiated Contact with Patient 03/18/12 1545      Chief Complaint  Patient presents with  . Emesis    (Consider location/radiation/quality/duration/timing/severity/associated sxs/prior treatment) HPI Comments: Patient presents with nausea and vomiting and diarrhea this started yesterday. Symptoms started around 7 PM and she had 2 episodes of loose nonbloody bowel movements followed by callous episodes of nonbloody nonbilious emesis. Her husband had similar symptoms 24 hours ago and have resolved. She saw her PCP today and got a phenergan injection around noon which hasn't helped. She denies any abdominal pain, fever, chest pain, shortness of breath, no focal weakness. She is not on antibiotics recently. Her last episode of vomiting 30 minutes ago. She endorses generalized weakness.  The history is provided by the patient and the spouse.    Past Medical History  Diagnosis Date  . Hypertension   . Asthma   . Pneumonia   . COPD (chronic obstructive pulmonary disease)   . Neuromuscular disorder     carpal tunnel- left    Past Surgical History  Procedure Date  . Tonsillectomy   . Abdominal hysterectomy   . Salpingoophorectomy ~8 yrs ago  . Back surgery 25+ yrs ago    lumbar laminectomy L5-S1  . Bladder suspension ~10 yrs ago  . Carpal tunnel release 03/08/2012    Procedure: CARPAL TUNNEL RELEASE;  Surgeon: Cammie Sickle., MD;  Location: [facility];  Service: Orthopedics;  Laterality: Left;    No family history on file.  History  Substance Use Topics  . Smoking status: Former Smoker -- 2.0 packs/day for 20 years    Types: Cigarettes    Quit date: 03/06/1981  . Smokeless tobacco: Not on file  . Alcohol Use: No    OB History    Grav Para Term Preterm Abortions TAB SAB Ect Mult Living                  Review of Systems  Constitutional: Positive for activity change,  appetite change and fatigue. Negative for fever.  HENT: Negative for congestion and rhinorrhea.   Respiratory: Negative for cough, chest tightness and shortness of breath.   Cardiovascular: Negative for chest pain.  Gastrointestinal: Positive for nausea, vomiting and diarrhea. Negative for abdominal pain.  Genitourinary: Negative for vaginal bleeding and vaginal discharge.  Musculoskeletal: Positive for myalgias and arthralgias. Negative for back pain.  Skin: Negative for rash.  Neurological: Positive for dizziness, weakness and light-headedness. Negative for headaches.  A complete 10 system review of systems was obtained and all systems are negative except as noted in the HPI and PMH.    Allergies  Ceclor; Cefzil; Erythromycin; Griseofulvin; Morphine and related; and Penicillins  Home Medications   Current Outpatient Rx  Name  Route  Sig  Dispense  Refill  . AMLODIPINE BESYLATE 2.5 MG PO TABS   Oral   Take 2.5 mg by mouth daily.         Marland Kitchen BENAZEPRIL HCL 40 MG PO TABS   Oral   Take 40 mg by mouth 2 (two) times daily. Pt states she takes two tablet daily         . CALCIUM 1000 + D PO   Oral   Take by mouth.         . CLONIDINE HCL 0.1 MG PO TABS   Oral   Take 0.1 mg by mouth 3 (  three) times daily. Pt states she take 2 tablets in am; 1 tablet midday; 2 tablets pm         . ESTRADIOL 0.0375 MG/24HR TD PTTW   Transdermal   Place 1 patch onto the skin 2 (two) times a week.         Marland Kitchen FLAXSEED OIL 1030 MG PO CAPS   Oral   Take 2 capsules by mouth daily.         Marland Kitchen FLUTICASONE-SALMETEROL 250-50 MCG/DOSE IN AEPB   Inhalation   Inhale 1 puff into the lungs 2 (two) times daily.   1 each   3   . HYDROCODONE-ACETAMINOPHEN 5-325 MG PO TABS   Oral   Take 1 tablet by mouth every 4 (four) hours as needed. 1 or 2 tabs every 4 hours as needed for pain         . LORATADINE 10 MG PO TABS   Oral   Take 10 mg by mouth daily.         . MELOXICAM 15 MG PO TABS   Oral    Take 15 mg by mouth daily.         Marland Kitchen MONTELUKAST SODIUM 10 MG PO TABS   Oral   Take 10 mg by mouth at bedtime.         Marland Kitchen FISH OIL 1000 MG PO CAPS   Oral   Take 2 capsules by mouth daily.         . TRAMADOL HCL 50 MG PO TABS   Oral   Take 50 mg by mouth every 6 (six) hours as needed. pain           BP 185/74  Pulse 92  Temp 99.3 F (37.4 C) (Oral)  Resp 22  SpO2 97%  Physical Exam  Constitutional: She is oriented to person, place, and time. She appears well-developed and well-nourished. No distress.  HENT:  Head: Normocephalic and atraumatic.  Mouth/Throat: No oropharyngeal exudate.       Dry mucous membranes  Eyes: Conjunctivae normal and EOM are normal. Pupils are equal, round, and reactive to light.  Neck: Normal range of motion. Neck supple.  Cardiovascular: Normal rate, regular rhythm and normal heart sounds.   No murmur heard.      Tachycardic  Pulmonary/Chest: Effort normal and breath sounds normal.  Abdominal: Soft. Bowel sounds are normal. There is no tenderness. There is no rebound and no guarding.  Musculoskeletal: Normal range of motion. She exhibits no edema and no tenderness.  Neurological: She is alert and oriented to person, place, and time. No cranial nerve deficit. She exhibits normal muscle tone. Coordination normal.  Skin: Skin is warm.    ED Course  Procedures (including critical care time)  Labs Reviewed  CBC WITH DIFFERENTIAL - Abnormal; Notable for the following:    RBC 5.45 (*)     Hemoglobin 15.4 (*)     Neutrophils Relative 88 (*)     Neutro Abs 8.7 (*)     Lymphocytes Relative 7 (*)     All other components within normal limits  COMPREHENSIVE METABOLIC PANEL - Abnormal; Notable for the following:    Sodium 134 (*)     Potassium 3.0 (*)     Glucose, Bld 153 (*)     GFR calc non Af Amer 85 (*)     All other components within normal limits  URINALYSIS, ROUTINE W REFLEX MICROSCOPIC - Abnormal; Notable for the following:  APPearance CLOUDY (*)     Hgb urine dipstick MODERATE (*)     Ketones, ur >80 (*)     Protein, ur >300 (*)     Nitrite POSITIVE (*)     All other components within normal limits  URINE MICROSCOPIC-ADD ON - Abnormal; Notable for the following:    Bacteria, UA MANY (*)     Casts HYALINE CASTS (*)  GRANULAR CAST   All other components within normal limits  LIPASE, BLOOD  LACTIC ACID, PLASMA  TROPONIN I  URINE CULTURE   No results found.   1. Dehydration   2. Hypertensive urgency   3. Hypokalemia   4. Persistent vomiting       MDM  20 hours of nausea, vomiting and diarrhea. Hypertensive and tachycardic on arrival. Abdomen soft and nontender. Suspect viral illness as husband had similar symptoms.  Will give IV fluids, antiemetics, labs, urinalysis, reassess  Workup is remarkable for her urinary tract infection with large ketones. Patient's abdominal exam remains benign. Her blood pressure remains elevated. Have ordered home blood pressure medications as she has not had any vomiting in the ED. She remains significantly tachycardic in the 120s to 130s despite fluid hydration. It is possible she is reacting to her lack of beta blocker.  Patient is anxious to go home But I am uncomfortable discharging her with her tachycardia and ongoing symptoms. She is agreeable to admission for observation to improve her heart rate and ongoing nausea.   Date: 03/18/2012  Rate: 117  Rhythm: sinus tachycardia  QRS Axis: normal  Intervals: normal  ST/T Wave abnormalities: ST depressions laterally  Conduction Disutrbances:none  Narrative Interpretation: Increased rate, likely rate related ST depressions laterally  Old EKG Reviewed: changes noted       Ezequiel Essex, MD 03/18/12 2333

## 2012-03-18 NOTE — ED Notes (Signed)
220 cc of Sprite given per pt's request for fluid challenge.

## 2012-03-18 NOTE — ED Notes (Signed)
Per patient, started vomiting last night, saw PCP and got phenergan injection which has not helped-unable to keep anything down

## 2012-03-18 NOTE — H&P (Signed)
[facility] History and Physical  Joanne Holmes [id number] DOB: May 16, 1945 DOA: 03/18/2012   PCP: Vedia Coffer, PA   Chief Complaint: Persistent vomiting  HPI:  66 year old female with a history of hypertension, COPD presents with one-day history of intractable nausea and vomiting. The patient stated that she has vomited over 30 times including dry heaves in past 24 hours.  The patient believes that she may have contracted the illness from her husband who experienced a similar illness 2-3 days ago. In addition, the patient states that she was on her grandkids who also had a similar type illness over the weekend. She denied any fevers, chest discomfort, shortness breath, diarrhea, abdominal pain, hematemesis, hematochezia, or melena. There is no dysuria or hematuria.  In the emergency department, the patient began feeling somewhat better. Her vomiting improved with medications. Unfortunately, the patient has been unable to take her antihypertensive medications and was found to have a blood pressure of 213/95 in the emergency department. She was also tachycardic with a heart rate up to 122 despite 1 liter NS. The patient was given a one-time dose of labetalol 20 mg which did bring her heart rate down between 100-110. Because of her dehydration and tachycardia, the patient was requested for admission. The patient denies any dizziness, rashes, syncope. She did have subjective fevers and chills at home but her temperature in the emergency department was 99.3. Assessment/Plan: Intractable/persistent vomiting -Likely due to nonspecific gastroenteritis -LFTs, lipase negative -UA does not suggest UTI -Appears to be somewhat improved in the emergency department after anti-emetics -Abdomen is benign -Check influenza PCR -Gradually advance diet Dehydration -Patient is a little hemoconcentrated--baseline hemoglobin 13-14  -Continue IV fluids  -Check orthostatics  Hypertensive urgency   -Due to the patient's inability to take her medicines secondary to vomiting  -Patient was able to tolerate some of her oral medications in the emergency department after antiemetics -Hydralazine when necessary  SBP>190 Hypokalemia  -Likely due to the patient's vomiting  -Supplement  -Check magnesium  Sinus tachycardia  -Likely related to patient's dehydration  -Continue fluids  Abnormal EKG -Mild J-point depression V3-V5 -likely rate related -recheck ekg in am COPD -Continue long acting beta agonist -compensated -Albuterol and Atrovent when necessary shortness of breath      Past Medical History  Diagnosis Date  . Hypertension   . Asthma   . Pneumonia   . COPD (chronic obstructive pulmonary disease)   . Neuromuscular disorder     carpal tunnel- left   Past Surgical History  Procedure Date  . Tonsillectomy   . Abdominal hysterectomy   . Salpingoophorectomy ~8 yrs ago  . Back surgery 25+ yrs ago    lumbar laminectomy L5-S1  . Bladder suspension ~10 yrs ago  . Carpal tunnel release 03/08/2012    Procedure: CARPAL TUNNEL RELEASE;  Surgeon: Cammie Sickle., MD;  Location: [place];  Service: Orthopedics;  Laterality: Left;   Social History:  reports that she quit smoking about 31 years ago. Her smoking use included Cigarettes. She has a 40 pack-year smoking history. She does not have any smokeless tobacco history on file. She reports that she does not drink alcohol or use illicit drugs.  Family history: Mother died at age 57 of coronary artery disease and CHF. She also had hypertension and diabetes. Father died at age 28 from lymphosarcoma.   Allergies  Allergen Reactions  . Ceclor (Cefaclor) Other (See Comments)    Pt does not remember  . Cefzil (Cefprozil) Other (  See Comments)    Pt does not remember  . Erythromycin Itching and Rash  . Griseofulvin Swelling  . Morphine And Related Itching  . Penicillins Anaphylaxis      Prior to  Admission medications   Medication Sig Start Date End Date Taking? Authorizing Provider  amLODipine (NORVASC) 2.5 MG tablet Take 2.5 mg by mouth daily.   Yes Historical Provider, MD  benazepril (LOTENSIN) 40 MG tablet Take 40 mg by mouth 2 (two) times daily. Pt states she takes two tablet daily   Yes Historical Provider, MD  Calcium Carb-Cholecalciferol (CALCIUM 1000 + D PO) Take by mouth.   Yes Historical Provider, MD  cloNIDine (CATAPRES) 0.1 MG tablet Take 0.1 mg by mouth 3 (three) times daily. Pt states she take 2 tablets in am; 1 tablet midday; 2 tablets pm   Yes Historical Provider, MD  estradiol (VIVELLE-DOT) 0.0375 MG/24HR Place 1 patch onto the skin 2 (two) times a week.   Yes Historical Provider, MD  Flaxseed, Linseed, (FLAXSEED OIL) 1030 MG CAPS Take 2 capsules by mouth daily.   Yes Historical Provider, MD  Fluticasone-Salmeterol (ADVAIR DISKUS) 250-50 MCG/DOSE AEPB Inhale 1 puff into the lungs 2 (two) times daily. 08/18/11  Yes Wendie Agreste, MD  HYDROcodone-acetaminophen (NORCO/VICODIN) 5-325 MG per tablet Take 1 tablet by mouth every 4 (four) hours as needed. 1 or 2 tabs every 4 hours as needed for pain 03/08/12  Yes Marily Lente Dasnoit, PA  loratadine (CLARITIN) 10 MG tablet Take 10 mg by mouth daily.   Yes Historical Provider, MD  meloxicam (MOBIC) 15 MG tablet Take 15 mg by mouth daily.   Yes Historical Provider, MD  montelukast (SINGULAIR) 10 MG tablet Take 10 mg by mouth at bedtime.   Yes Historical Provider, MD  Omega-3 Fatty Acids (FISH OIL) 1000 MG CAPS Take 2 capsules by mouth daily.   Yes Historical Provider, MD  traMADol (ULTRAM) 50 MG tablet Take 50 mg by mouth every 6 (six) hours as needed. pain   Yes Historical Provider, MD    Review of Systems:  Constitutional:  No weight loss, night sweats, Fevers, chills, fatigue.  Head&Eyes: No headache.  No vision loss.  No eye pain or scotoma ENT:  No Difficulty swallowing,Tooth/dental problems,Sore throat,  No ear ache, post  nasal drip,  Cardio-vascular:  No chest pain, Orthopnea, PND, swelling in lower extremities,  dizziness, palpitations  GI:  No  abdominal pain, nausea, vomiting, diarrhea, loss of appetite, hematochezia, melena, heartburn, indigestion, Resp:  No shortness of breath with exertion or at rest. No cough. No coughing up of blood .No wheezing.No chest wall deformity  Skin:  no rash or lesions.  GU:  no dysuria, change in color of urine, no urgency or frequency. No flank pain.  Musculoskeletal:  No joint pain or swelling. No decreased range of motion. No back pain.  Psych:  No change in mood or affect. No depression or anxiety. Neurologic: No headache, no dysesthesia, no focal weakness, no vision loss. No syncope  Physical Exam: Filed Vitals:   03/18/12 1930 03/18/12 2024 03/18/12 2059 03/18/12 2207  BP: 192/82 196/90  185/74  Pulse: 119 115 122 88  Temp:  99.3 F (37.4 C)    TempSrc:  Oral    Resp:  20 20   SpO2: 95% 99% 97% 97%   General:  A&O x 3, NAD, nontoxic, pleasant/cooperative Head/Eye: No conjunctival hemorrhage, no icterus, [place]/AT ENT:  No icterus,  No thrush, edentulous, no pharyngeal exudate Neck:  No masses, no lymphadenpathy CV:  RRR, no rub, no gallop, no S3 Lung:  CTAB, good air movement, no wheeze, no rhonchi Abdomen: soft/NT, +BS, nondistended, no peritoneal signs Ext: No cyanosis, No rashes, No petechiae, No lymphangitis, No edema Neuro: CNII-XII intact, strength 4/5 in bilateral upper and lower extremities, no dysmetria  Labs on Admission:  Basic Metabolic Panel:  Lab 0000000 1750  NA 134*  K 3.0*  CL 99  CO2 19  GLUCOSE 153*  BUN 10  CREATININE 0.75  CALCIUM 9.7  MG --  PHOS --   Liver Function Tests:  Lab 03/18/12 1750  AST 23  ALT 18  ALKPHOS 109  BILITOT 0.4  PROT 7.7  ALBUMIN 4.0    Lab 03/18/12 1750  LIPASE 16  AMYLASE --   No results found for this basename: AMMONIA:5 in the last 168 hours CBC:  Lab 03/18/12 1750  WBC 9.9   NEUTROABS 8.7*  HGB 15.4*  HCT 44.2  MCV 81.1  PLT 255   Cardiac Enzymes:  Lab 03/18/12 1750  CKTOTAL --  CKMB --  CKMBINDEX --  TROPONINI <0.30   BNP: No components found with this basename: POCBNP:5 CBG: No results found for this basename: GLUCAP:5 in the last 168 hours  Radiological Exams on Admission: No results found.  EKG: Independently reviewed. Sinus tachycardia, heart rate 117, depressed J point V3-V5    Time spent:60 minutes Code Status:   Full Family Communication:   Husband at bedside   Joanne Briere, DO  [facility] Pager [phone]  If 7PM-7AM, please contact night-coverage [email or web address] Password [facility] 03/18/2012, 11:02 PM

## 2012-03-18 NOTE — ED Notes (Signed)
[id number] Expected date:<BR> Expected time:<BR> Means of arrival:<BR> Comments:<BR> Hold for triage 4

## 2012-03-19 DIAGNOSIS — J449 Chronic obstructive pulmonary disease, unspecified: Secondary | ICD-10-CM

## 2012-03-19 DIAGNOSIS — J4489 Other specified chronic obstructive pulmonary disease: Secondary | ICD-10-CM

## 2012-03-19 LAB — CBC
Platelets: 253 10*3/uL (ref 150–400)
RDW: 13.8 % (ref 11.5–15.5)
WBC: 8.6 10*3/uL (ref 4.0–10.5)

## 2012-03-19 LAB — BASIC METABOLIC PANEL
Calcium: 9.3 mg/dL (ref 8.4–10.5)
Chloride: 99 mEq/L (ref 96–112)
Creatinine, Ser: 0.8 mg/dL (ref 0.50–1.10)
GFR calc Af Amer: 86 mL/min — ABNORMAL LOW (ref >90)
GFR calc non Af Amer: 74 mL/min — ABNORMAL LOW (ref >90)

## 2012-03-19 LAB — INFLUENZA PANEL BY PCR (TYPE A & B)
H1N1 flu by pcr: NOT DETECTED
Influenza A By PCR: NEGATIVE
Influenza B By PCR: NEGATIVE

## 2012-03-19 MED ORDER — ACETAMINOPHEN 325 MG PO TABS: 650.0000 mg | ORAL_TABLET | Freq: Four times a day (QID) | ORAL | Status: DC | PRN

## 2012-03-19 MED ORDER — AMLODIPINE BESYLATE 5 MG PO TABS
5.0000 mg | ORAL_TABLET | Freq: Every day | ORAL | Status: DC
  Administered 2012-03-19: 5 mg via ORAL
  Filled 2012-03-19: qty 1

## 2012-03-19 MED ORDER — POTASSIUM CHLORIDE CRYS ER 20 MEQ PO TBCR
60.0000 meq | EXTENDED_RELEASE_TABLET | Freq: Once | ORAL | Status: AC
  Administered 2012-03-19: 60 meq via ORAL
  Filled 2012-03-19: qty 3

## 2012-03-19 MED ORDER — ACETAMINOPHEN 650 MG RE SUPP: 650.0000 mg | Freq: Four times a day (QID) | RECTAL | Status: DC | PRN

## 2012-03-19 MED ORDER — TRAMADOL HCL 50 MG PO TABS: 50.0000 mg | ORAL_TABLET | Freq: Four times a day (QID) | ORAL | Status: DC | PRN

## 2012-03-19 MED ORDER — ONDANSETRON HCL 4 MG PO TABS
4.0000 mg | ORAL_TABLET | Freq: Four times a day (QID) | ORAL | Status: DC | PRN
  Administered 2012-03-19: 4 mg via ORAL
  Filled 2012-03-19: qty 1

## 2012-03-19 MED ORDER — LORATADINE 10 MG PO TABS
10.0000 mg | ORAL_TABLET | Freq: Every day | ORAL | Status: DC
  Administered 2012-03-19: 10 mg via ORAL
  Filled 2012-03-19: qty 1

## 2012-03-19 MED ORDER — OMEGA-3-ACID ETHYL ESTERS 1 G PO CAPS
1.0000 g | ORAL_CAPSULE | Freq: Every day | ORAL | Status: DC
  Administered 2012-03-19: 1 g via ORAL
  Filled 2012-03-19: qty 1

## 2012-03-19 MED ORDER — HYDROCODONE-ACETAMINOPHEN 5-325 MG PO TABS: 1.0000 | ORAL_TABLET | ORAL | Status: DC | PRN

## 2012-03-19 MED ORDER — POTASSIUM CHLORIDE 20 MEQ/15ML (10%) PO LIQD
20.0000 meq | Freq: Once | ORAL | Status: AC
  Administered 2012-03-19: 20 meq via ORAL
  Filled 2012-03-19: qty 15

## 2012-03-19 MED ORDER — MOMETASONE FURO-FORMOTEROL FUM 100-5 MCG/ACT IN AERO
2.0000 | INHALATION_SPRAY | Freq: Two times a day (BID) | RESPIRATORY_TRACT | Status: DC
  Administered 2012-03-19: 2 via RESPIRATORY_TRACT
  Filled 2012-03-19: qty 8.8

## 2012-03-19 MED ORDER — ESTRADIOL 0.05 MG/24HR TD PTWK
0.0500 mg | MEDICATED_PATCH | TRANSDERMAL | Status: DC
  Filled 2012-03-19: qty 1

## 2012-03-19 MED ORDER — SODIUM CHLORIDE 0.9 % IJ SOLN
3.0000 mL | Freq: Two times a day (BID) | INTRAMUSCULAR | Status: DC
  Administered 2012-03-19: 3 mL via INTRAVENOUS

## 2012-03-19 MED ORDER — ONDANSETRON HCL 4 MG PO TABS: 4.0000 mg | ORAL_TABLET | Freq: Four times a day (QID) | ORAL | Status: DC | PRN

## 2012-03-19 MED ORDER — ENOXAPARIN SODIUM 40 MG/0.4ML ~~LOC~~ SOLN
40.0000 mg | SUBCUTANEOUS | Status: DC
  Administered 2012-03-19: 40 mg via SUBCUTANEOUS
  Filled 2012-03-19: qty 0.4

## 2012-03-19 MED ORDER — ONDANSETRON HCL 4 MG/2ML IJ SOLN
4.0000 mg | Freq: Four times a day (QID) | INTRAMUSCULAR | Status: DC | PRN
  Administered 2012-03-19: 4 mg via INTRAVENOUS

## 2012-03-19 MED ORDER — CIPROFLOXACIN HCL 500 MG PO TABS: 500.0000 mg | ORAL_TABLET | Freq: Two times a day (BID) | ORAL | Status: DC

## 2012-03-19 MED ORDER — MONTELUKAST SODIUM 10 MG PO TABS
10.0000 mg | ORAL_TABLET | Freq: Every day | ORAL | Status: DC
  Filled 2012-03-19: qty 1

## 2012-03-19 NOTE — Progress Notes (Signed)
Pt left hospital at 1430 with her husband at her side.  Pt alert, oriented, and without c/o. Discharge instructions/prescriptions given/explained with pt verbalizing understanding.

## 2012-03-19 NOTE — Discharge Summary (Signed)
Physician Discharge Summary  Joanne Holmes [id number] DOB: 22-Feb-1946 DOA: 03/18/2012  PCP: Vedia Coffer, PA  Admit date: 03/18/2012 Discharge date: 03/19/2012    Recommendations for Outpatient Follow-up:  1. Please check potassium by the end of the week 2. Follow up with PCP if tachycardia does not improve.  Discharge Diagnoses:  Active Problems:  Dehydration  Persistent vomiting  Hypertensive urgency  COPD (chronic obstructive pulmonary disease)  Hypokalemia   Discharge Condition: stable  Diet recommendation: low sodium diet  Filed Weights   03/19/12 0313  Weight: 91.1 kg (200 lb 13.4 oz)    History of present illness:  66 year old female with a history of hypertension, COPD presents with one-day history of intractable nausea and vomiting. The patient stated that she has vomited over 30 times including dry heaves in past 24 hours. The patient believes that she may have contracted the illness from her husband who experienced a similar illness 2-3 days ago. In addition, the patient states that she was on her grandkids who also had a similar type illness over the weekend. She was admitted for hydration and hypertensive urgency   Hospital Course:   Intractable/persistent vomiting  -Likely due to nonspecific gastroenteritis  -LFTs, lipase negative  -Appears to be somewhat improved in the emergency department after anti-emetics  -Abdomen is benign  - Influenza PCR negative - Advanced  diet tolerated. Dehydration  -Patient is a little hemoconcentrated--baseline hemoglobin 13-14  resolved Hypertensive urgency  -Due to the patient's inability to take her medicines secondary to vomiting . Resolved.   Hypokalemia  -Likely due to the patient's vomiting  -Supplemented   Sinus tachycardia  -Likely related to patient's dehydration   Abnormal EKG  -Mild J-point depression V3-V5  -likely rate related  -recheck ekg better.   COPD  -Continue long acting beta  agonist  -compensated       Consultations:  none  Discharge Exam: Filed Vitals:   03/19/12 0313 03/19/12 0606 03/19/12 0607 03/19/12 0608  BP: 146/83 184/79 148/76 141/79  Pulse: 118 117 123 132  Temp: 99.1 F (37.3 C) 99 F (37.2 C) 99 F (37.2 C) 99 F (37.2 C)  TempSrc: Oral Oral Oral Oral  Resp: 20 18 18 18   Height: 5\' 4"  (1.626 m)     Weight: 91.1 kg (200 lb 13.4 oz)     SpO2: 98% 97% 98% 97%   General: A&O x 3, NAD, nontoxic, pleasant/cooperative CV: RRR, no rub, no gallop, no S3  Lung: CTAB, good air movement, no wheeze, no rhonchi  Abdomen: soft/NT, +BS, nondistended, no peritoneal signs  Ext: No cyanosis, No rashes, No petechiae, No lymphangitis, No edema   Discharge Instructions  Discharge Orders    Future Orders Please Complete By Expires   Diet - low sodium heart healthy      Discharge instructions      Comments:   Follow up with PCP if your tachycardia/ Rapid heart rate does not improve in a day. Keep your self hydrated.   Activity as tolerated - No restrictions          Medication List     As of 03/19/2012 12:19 PM    TAKE these medications         amLODipine 2.5 MG tablet   Commonly known as: NORVASC   Take 2.5 mg by mouth daily.      benazepril 40 MG tablet   Commonly known as: LOTENSIN   Take 40 mg by mouth 2 (two) times daily. Pt states  she takes two tablet daily      CALCIUM 1000 + D PO   Take by mouth.      ciprofloxacin 500 MG tablet   Commonly known as: CIPRO   Take 1 tablet (500 mg total) by mouth 2 (two) times daily.      CLARITIN 10 MG tablet   Generic drug: loratadine   Take 10 mg by mouth daily.      cloNIDine 0.1 MG tablet   Commonly known as: CATAPRES   Take 0.1 mg by mouth 3 (three) times daily. Pt states she take 2 tablets in am; 1 tablet midday; 2 tablets pm      estradiol 0.0375 MG/24HR   Commonly known as: VIVELLE-DOT   Place 1 patch onto the skin 2 (two) times a week.      Fish Oil 1000 MG Caps   Take  2 capsules by mouth daily.      Flaxseed Oil 1030 MG Caps   Take 2 capsules by mouth daily.      Fluticasone-Salmeterol 250-50 MCG/DOSE Aepb   Commonly known as: ADVAIR   Inhale 1 puff into the lungs 2 (two) times daily.      HYDROcodone-acetaminophen 5-325 MG per tablet   Commonly known as: NORCO/VICODIN   Take 1 tablet by mouth every 4 (four) hours as needed. 1 or 2 tabs every 4 hours as needed for pain      meloxicam 15 MG tablet   Commonly known as: MOBIC   Take 15 mg by mouth daily.      montelukast 10 MG tablet   Commonly known as: SINGULAIR   Take 10 mg by mouth at bedtime.      ondansetron 4 MG tablet   Commonly known as: ZOFRAN   Take 1 tablet (4 mg total) by mouth every 6 (six) hours as needed for nausea.      traMADol 50 MG tablet   Commonly known as: ULTRAM   Take 50 mg by mouth every 6 (six) hours as needed. pain          The results of significant diagnostics from this hospitalization (including imaging, microbiology, ancillary and laboratory) are listed below for reference.    Significant Diagnostic Studies: No results found.  Microbiology: No results found for this or any previous visit (from the past 240 hour(s)).   Labs: Basic Metabolic Panel:  Lab [id number] 03/18/12 1750  NA 132* 134*  K 3.2* 3.0*  CL 99 99  CO2 20 19  GLUCOSE 138* 153*  BUN 9 10  CREATININE 0.80 0.75  CALCIUM 9.3 9.7  MG 1.6 --  PHOS -- --   Liver Function Tests:  Lab 03/18/12 1750  AST 23  ALT 18  ALKPHOS 109  BILITOT 0.4  PROT 7.7  ALBUMIN 4.0    Lab 03/18/12 1750  LIPASE 16  AMYLASE --   No results found for this basename: AMMONIA:5 in the last 168 hours CBC:  Lab 03/19/12 0510 03/18/12 1750  WBC 8.6 9.9  NEUTROABS -- 8.7*  HGB 14.9 15.4*  HCT 43.0 44.2  MCV 81.3 81.1  PLT 253 255   Cardiac Enzymes:  Lab 03/18/12 1750  CKTOTAL --  CKMB --  CKMBINDEX --  TROPONINI <0.30   BNP: BNP (last 3 results) No results found for this basename:  PROBNP:3 in the last 8760 hours CBG: No results found for this basename: GLUCAP:5 in the last 168 hours     Signed:  Kymia Simi  [facility] 03/19/2012, 12:19 PM

## 2012-03-20 LAB — URINE CULTURE: Colony Count: >=100000

## 2012-09-25 ENCOUNTER — Other Ambulatory Visit: Payer: Self-pay

## 2012-09-25 DIAGNOSIS — Z1231 Encounter for screening mammogram for malignant neoplasm of breast: Secondary | ICD-10-CM

## 2012-10-23 ENCOUNTER — Ambulatory Visit
Admission: RE | Admit: 2012-10-23 | Discharge: 2012-10-23 | Disposition: A | Discharge status: Home / Self Care | Payer: Medicare Other | Source: Ambulatory Visit

## 2012-10-23 DIAGNOSIS — Z1231 Encounter for screening mammogram for malignant neoplasm of breast: Secondary | ICD-10-CM

## 2013-01-19 ENCOUNTER — Other Ambulatory Visit: Payer: Self-pay | Admitting: Family Medicine

## 2013-11-12 ENCOUNTER — Other Ambulatory Visit: Payer: Self-pay

## 2013-11-12 DIAGNOSIS — Z1231 Encounter for screening mammogram for malignant neoplasm of breast: Secondary | ICD-10-CM

## 2013-12-03 ENCOUNTER — Ambulatory Visit: Payer: Medicare Other

## 2014-02-17 ENCOUNTER — Ambulatory Visit
Admission: RE | Admit: 2014-02-17 | Discharge: 2014-02-17 | Disposition: A | Discharge status: Home / Self Care | Payer: Medicare HMO | Source: Ambulatory Visit

## 2014-02-17 DIAGNOSIS — Z1231 Encounter for screening mammogram for malignant neoplasm of breast: Secondary | ICD-10-CM

## 2014-02-18 ENCOUNTER — Other Ambulatory Visit: Payer: Self-pay | Admitting: Obstetrics and Gynecology

## 2014-02-18 DIAGNOSIS — R928 Other abnormal and inconclusive findings on diagnostic imaging of breast: Secondary | ICD-10-CM

## 2014-03-04 ENCOUNTER — Ambulatory Visit
Admission: RE | Admit: 2014-03-04 | Discharge: 2014-03-04 | Disposition: A | Discharge status: Home / Self Care | Payer: Medicare HMO | Source: Ambulatory Visit | Attending: Obstetrics and Gynecology | Admitting: Obstetrics and Gynecology

## 2014-03-04 DIAGNOSIS — R928 Other abnormal and inconclusive findings on diagnostic imaging of breast: Secondary | ICD-10-CM

## 2014-04-23 ENCOUNTER — Ambulatory Visit (INDEPENDENT_AMBULATORY_CARE_PROVIDER_SITE_OTHER): Payer: Managed Care, Other (non HMO) | Admitting: Podiatry

## 2014-04-23 ENCOUNTER — Encounter: Payer: Self-pay | Admitting: Podiatry

## 2014-04-23 ENCOUNTER — Ambulatory Visit (INDEPENDENT_AMBULATORY_CARE_PROVIDER_SITE_OTHER): Payer: Managed Care, Other (non HMO)

## 2014-04-23 VITALS — BP 184/79 | HR 89 | Resp 14 | Ht 62.0 in | Wt 205.0 lb

## 2014-04-23 DIAGNOSIS — M7661 Achilles tendinitis, right leg: Secondary | ICD-10-CM

## 2014-04-23 DIAGNOSIS — M79671 Pain in right foot: Secondary | ICD-10-CM

## 2014-04-23 MED ORDER — TRIAMCINOLONE ACETONIDE 10 MG/ML IJ SUSP
10.0000 mg | Freq: Once | INTRAMUSCULAR | Status: AC
  Administered 2014-04-23: 10 mg

## 2014-04-23 NOTE — Progress Notes (Signed)
Subjective:     Patient ID: Joanne Holmes, female   DOB: 08-08-1945, 69 y.o.   MRN: [id number]  HPI patient presents stating she's getting a lot of pain in her right Achilles tendon and also she has a structural bunion right over left that's been going on a long time. States the pain in the tendon of the back of the heel is been present for around 4 months after playing soccer with her grandchild   Review of Systems  All other systems reviewed and are negative.      Objective:   Physical Exam  Constitutional: She is oriented to person, place, and time.  Cardiovascular: Intact distal pulses.   Musculoskeletal: Normal range of motion.  Neurological: She is oriented to person, place, and time.  Skin: Skin is warm.  Nursing note and vitals reviewed.  neurovascular status found to be intact with muscle strength adequate and range of motion subtalar and midtarsal joint within normal limits. Patient is noted to have a painful insertional Achilles tendon right medial side with inflammation and fluid buildup and is found to have good tendon strength when tested. Patient is noted to have structural bunion deformity right with redness and deviation the hallux against second toe and does have good digital perfusion and is well oriented 3     Assessment:     Acute Achilles tendinitis right medial side and structural HAV deformity    Plan:     Reviewed both conditions and at this time did careful injection of the medial side 3 mg Kenalog 5 mg Xylocaine and instructed first prior to doing this the possibilities for rupture. I applied a short air fracture walker to completely immobilize and reappoint again in 3 weeks earlier if any issues should occur and do not recommend bunion correction at this time

## 2014-04-23 NOTE — Progress Notes (Signed)
   Subjective:    Patient ID: Joanne Holmes, female    DOB: 28-Oct-1945, 69 y.o.   MRN: [id number]  HPI Comments: Pt complains of right posterior heel and plantar heel after kicking a soccer ball in 12/2013, then after Christmas 2015, a fall from a chair, at home.  Pt's doctor sent to PT and was given exercises.  Pt complains increasing pain in the right 1st MPJ the last few months to a year.    Foot Pain      Review of Systems  Cardiovascular:       Pt states she is currently receiving new treatment for BP.  All other systems reviewed and are negative.      Objective:   Physical Exam        Assessment & Plan:

## 2014-05-10 ENCOUNTER — Inpatient Hospital Stay (HOSPITAL_COMMUNITY)
Admission: EM | Admit: 2014-05-10 | Discharge: 2014-05-12 | DRG: 690 | Disposition: A | Discharge status: Home / Self Care | Payer: Medicare HMO | Attending: Internal Medicine | Admitting: Internal Medicine

## 2014-05-10 ENCOUNTER — Encounter (HOSPITAL_COMMUNITY): Payer: Self-pay

## 2014-05-10 ENCOUNTER — Emergency Department (HOSPITAL_COMMUNITY): Payer: Medicare HMO

## 2014-05-10 DIAGNOSIS — M4806 Spinal stenosis, lumbar region: Secondary | ICD-10-CM | POA: Diagnosis present

## 2014-05-10 DIAGNOSIS — N10 Acute tubulo-interstitial nephritis: Principal | ICD-10-CM | POA: Diagnosis present

## 2014-05-10 DIAGNOSIS — R112 Nausea with vomiting, unspecified: Secondary | ICD-10-CM | POA: Diagnosis present

## 2014-05-10 DIAGNOSIS — D649 Anemia, unspecified: Secondary | ICD-10-CM | POA: Diagnosis present

## 2014-05-10 DIAGNOSIS — J45909 Unspecified asthma, uncomplicated: Secondary | ICD-10-CM | POA: Diagnosis present

## 2014-05-10 DIAGNOSIS — N200 Calculus of kidney: Secondary | ICD-10-CM | POA: Diagnosis present

## 2014-05-10 DIAGNOSIS — I10 Essential (primary) hypertension: Secondary | ICD-10-CM | POA: Diagnosis present

## 2014-05-10 DIAGNOSIS — Z79899 Other long term (current) drug therapy: Secondary | ICD-10-CM

## 2014-05-10 DIAGNOSIS — E876 Hypokalemia: Secondary | ICD-10-CM | POA: Diagnosis present

## 2014-05-10 DIAGNOSIS — E860 Dehydration: Secondary | ICD-10-CM | POA: Diagnosis present

## 2014-05-10 DIAGNOSIS — K529 Noninfective gastroenteritis and colitis, unspecified: Secondary | ICD-10-CM | POA: Diagnosis present

## 2014-05-10 DIAGNOSIS — R197 Diarrhea, unspecified: Secondary | ICD-10-CM | POA: Diagnosis present

## 2014-05-10 DIAGNOSIS — R109 Unspecified abdominal pain: Secondary | ICD-10-CM

## 2014-05-10 DIAGNOSIS — N179 Acute kidney failure, unspecified: Secondary | ICD-10-CM | POA: Diagnosis present

## 2014-05-10 DIAGNOSIS — N12 Tubulo-interstitial nephritis, not specified as acute or chronic: Secondary | ICD-10-CM | POA: Diagnosis present

## 2014-05-10 DIAGNOSIS — J449 Chronic obstructive pulmonary disease, unspecified: Secondary | ICD-10-CM | POA: Diagnosis present

## 2014-05-10 DIAGNOSIS — B9620 Unspecified Escherichia coli [E. coli] as the cause of diseases classified elsewhere: Secondary | ICD-10-CM | POA: Diagnosis present

## 2014-05-10 DIAGNOSIS — R509 Fever, unspecified: Secondary | ICD-10-CM

## 2014-05-10 HISTORY — DX: Calculus of kidney: N20.0

## 2014-05-10 LAB — CBC WITH DIFFERENTIAL/PLATELET
BASOS ABS: 0 10*3/uL (ref 0.0–0.1)
Basophils Relative: 0 % (ref 0–1)
Eosinophils Absolute: 0 10*3/uL (ref 0.0–0.7)
Eosinophils Relative: 0 % (ref 0–5)
HCT: 29.3 % — ABNORMAL LOW (ref 36.0–46.0)
Hemoglobin: 10.3 g/dL — ABNORMAL LOW (ref 12.0–15.0)
Lymphocytes Relative: 5 % — ABNORMAL LOW (ref 12–46)
Lymphs Abs: 0.6 10*3/uL — ABNORMAL LOW (ref 0.7–4.0)
MCH: 28.5 pg (ref 26.0–34.0)
MCHC: 35.2 g/dL (ref 30.0–36.0)
MCV: 80.9 fL (ref 78.0–100.0)
MONO ABS: 1.3 10*3/uL — AB (ref 0.1–1.0)
Monocytes Relative: 12 % (ref 3–12)
NEUTROS PCT: 83 % — AB (ref 43–77)
Neutro Abs: 8.3 10*3/uL — ABNORMAL HIGH (ref 1.7–7.7)
PLATELETS: 183 10*3/uL (ref 150–400)
RBC: 3.62 MIL/uL — ABNORMAL LOW (ref 3.87–5.11)
RDW: 12.6 % (ref 11.5–15.5)
WBC: 10.2 10*3/uL (ref 4.0–10.5)

## 2014-05-10 LAB — URINE MICROSCOPIC-ADD ON

## 2014-05-10 LAB — COMPREHENSIVE METABOLIC PANEL
ALT: 28 U/L (ref 0–35)
AST: 39 U/L — ABNORMAL HIGH (ref 0–37)
Albumin: 2.2 g/dL — ABNORMAL LOW (ref 3.5–5.2)
Alkaline Phosphatase: 117 U/L (ref 39–117)
Anion gap: 12 (ref 5–15)
BILIRUBIN TOTAL: 0.9 mg/dL (ref 0.3–1.2)
BUN: 37 mg/dL — ABNORMAL HIGH (ref 6–23)
CO2: 21 mmol/L (ref 19–32)
CREATININE: 1.68 mg/dL — AB (ref 0.50–1.10)
Calcium: 8.7 mg/dL (ref 8.4–10.5)
Chloride: 94 mmol/L — ABNORMAL LOW (ref 96–112)
GFR calc Af Amer: 34 mL/min — ABNORMAL LOW (ref >90)
GFR calc non Af Amer: 30 mL/min — ABNORMAL LOW (ref >90)
Glucose, Bld: 111 mg/dL — ABNORMAL HIGH (ref 70–99)
Potassium: 2.8 mmol/L — ABNORMAL LOW (ref 3.5–5.1)
Sodium: 127 mmol/L — ABNORMAL LOW (ref 135–145)
Total Protein: 5.7 g/dL — ABNORMAL LOW (ref 6.0–8.3)

## 2014-05-10 LAB — URINALYSIS, ROUTINE W REFLEX MICROSCOPIC
Bilirubin Urine: NEGATIVE
Glucose, UA: NEGATIVE mg/dL
KETONES UR: NEGATIVE mg/dL
Nitrite: NEGATIVE
PH: 5.5 (ref 5.0–8.0)
Protein, ur: 30 mg/dL — AB
Specific Gravity, Urine: 1.012 (ref 1.005–1.030)
Urobilinogen, UA: 0.2 mg/dL (ref 0.0–1.0)

## 2014-05-10 LAB — MAGNESIUM: Magnesium: 2 mg/dL (ref 1.5–2.5)

## 2014-05-10 MED ORDER — ESTRADIOL 0.0375 MG/24HR TD PTWK
0.0375 mg | MEDICATED_PATCH | TRANSDERMAL | Status: DC
  Filled 2014-05-10: qty 1

## 2014-05-10 MED ORDER — ONDANSETRON HCL 4 MG PO TABS: 4.0000 mg | ORAL_TABLET | Freq: Four times a day (QID) | ORAL | Status: DC | PRN

## 2014-05-10 MED ORDER — VANCOMYCIN 50 MG/ML ORAL SOLUTION
125.0000 mg | Freq: Four times a day (QID) | ORAL | Status: DC
  Filled 2014-05-10 (×2): qty 2.5

## 2014-05-10 MED ORDER — ONDANSETRON HCL 4 MG/2ML IJ SOLN
4.0000 mg | Freq: Once | INTRAMUSCULAR | Status: AC
  Administered 2014-05-10: 4 mg via INTRAVENOUS
  Filled 2014-05-10: qty 2

## 2014-05-10 MED ORDER — HYDROCODONE-ACETAMINOPHEN 5-325 MG PO TABS
1.0000 | ORAL_TABLET | ORAL | Status: DC | PRN
  Administered 2014-05-10 – 2014-05-11 (×2): 1 via ORAL
  Filled 2014-05-10 (×2): qty 1

## 2014-05-10 MED ORDER — ONDANSETRON HCL 4 MG/2ML IJ SOLN: 4.0000 mg | Freq: Four times a day (QID) | INTRAMUSCULAR | Status: DC | PRN

## 2014-05-10 MED ORDER — MOMETASONE FURO-FORMOTEROL FUM 100-5 MCG/ACT IN AERO
2.0000 | INHALATION_SPRAY | Freq: Two times a day (BID) | RESPIRATORY_TRACT | Status: DC
  Administered 2014-05-11 – 2014-05-12 (×2): 2 via RESPIRATORY_TRACT
  Filled 2014-05-10: qty 8.8

## 2014-05-10 MED ORDER — SODIUM CHLORIDE 0.9 % IV BOLUS (SEPSIS)
1000.0000 mL | Freq: Once | INTRAVENOUS | Status: AC
  Administered 2014-05-10: 1000 mL via INTRAVENOUS

## 2014-05-10 MED ORDER — MONTELUKAST SODIUM 10 MG PO TABS
10.0000 mg | ORAL_TABLET | Freq: Every day | ORAL | Status: DC
  Administered 2014-05-10 – 2014-05-11 (×2): 10 mg via ORAL
  Filled 2014-05-10 (×2): qty 1

## 2014-05-10 MED ORDER — AMLODIPINE BESYLATE 10 MG PO TABS
10.0000 mg | ORAL_TABLET | Freq: Every day | ORAL | Status: DC
  Administered 2014-05-11 – 2014-05-12 (×2): 10 mg via ORAL
  Filled 2014-05-10 (×3): qty 1

## 2014-05-10 MED ORDER — ESTRADIOL 0.0375 MG/24HR TD PTTW
1.0000 | MEDICATED_PATCH | TRANSDERMAL | Status: DC
  Filled 2014-05-10: qty 1

## 2014-05-10 MED ORDER — OMEGA-3-ACID ETHYL ESTERS 1 G PO CAPS
1.0000 g | ORAL_CAPSULE | Freq: Every day | ORAL | Status: DC
  Administered 2014-05-11 – 2014-05-12 (×2): 1 g via ORAL
  Filled 2014-05-10 (×2): qty 1

## 2014-05-10 MED ORDER — ALUM & MAG HYDROXIDE-SIMETH 200-200-20 MG/5ML PO SUSP: 30.0000 mL | Freq: Four times a day (QID) | ORAL | Status: DC | PRN

## 2014-05-10 MED ORDER — ALBUTEROL SULFATE (2.5 MG/3ML) 0.083% IN NEBU: 2.5000 mg | INHALATION_SOLUTION | Freq: Four times a day (QID) | RESPIRATORY_TRACT | Status: DC | PRN

## 2014-05-10 MED ORDER — ACETAMINOPHEN 325 MG PO TABS
650.0000 mg | ORAL_TABLET | Freq: Four times a day (QID) | ORAL | Status: DC | PRN
  Administered 2014-05-10: 650 mg via ORAL
  Filled 2014-05-10: qty 2

## 2014-05-10 MED ORDER — DEXTROSE 5 % IV SOLN
1.0000 g | Freq: Three times a day (TID) | INTRAVENOUS | Status: DC
  Filled 2014-05-10 (×2): qty 1

## 2014-05-10 MED ORDER — SODIUM CHLORIDE 0.9 % IJ SOLN
3.0000 mL | Freq: Two times a day (BID) | INTRAMUSCULAR | Status: DC
  Administered 2014-05-11: 3 mL via INTRAVENOUS

## 2014-05-10 MED ORDER — HEPARIN SODIUM (PORCINE) 5000 UNIT/ML IJ SOLN
5000.0000 [IU] | Freq: Three times a day (TID) | INTRAMUSCULAR | Status: DC
  Administered 2014-05-10 – 2014-05-12 (×5): 5000 [IU] via SUBCUTANEOUS
  Filled 2014-05-10 (×5): qty 1

## 2014-05-10 MED ORDER — POTASSIUM CHLORIDE 10 MEQ/100ML IV SOLN
10.0000 meq | Freq: Once | INTRAVENOUS | Status: AC
  Administered 2014-05-10: 10 meq via INTRAVENOUS
  Filled 2014-05-10: qty 100

## 2014-05-10 MED ORDER — ACETAMINOPHEN 650 MG RE SUPP
650.0000 mg | Freq: Four times a day (QID) | RECTAL | Status: DC | PRN
Start: 2014-05-10 — End: 2014-05-12

## 2014-05-10 MED ORDER — MORPHINE SULFATE 2 MG/ML IJ SOLN: 2.0000 mg | INTRAMUSCULAR | Status: DC | PRN

## 2014-05-10 MED ORDER — CIPROFLOXACIN IN D5W 400 MG/200ML IV SOLN
400.0000 mg | Freq: Two times a day (BID) | INTRAVENOUS | Status: DC
  Administered 2014-05-10 – 2014-05-11 (×3): 400 mg via INTRAVENOUS
  Filled 2014-05-10 (×6): qty 200

## 2014-05-10 MED ORDER — IOHEXOL 300 MG/ML  SOLN
25.0000 mL | Freq: Once | INTRAMUSCULAR | Status: AC | PRN
  Administered 2014-05-10: 25 mL via ORAL

## 2014-05-10 MED ORDER — POTASSIUM CHLORIDE 10 MEQ/100ML IV SOLN
10.0000 meq | INTRAVENOUS | Status: AC
  Administered 2014-05-10 – 2014-05-11 (×4): 10 meq via INTRAVENOUS
  Filled 2014-05-10 (×4): qty 100

## 2014-05-10 MED ORDER — ALBUTEROL SULFATE HFA 108 (90 BASE) MCG/ACT IN AERS: 2.0000 | INHALATION_SPRAY | Freq: Every day | RESPIRATORY_TRACT | Status: DC | PRN

## 2014-05-10 MED ORDER — POTASSIUM CHLORIDE IN NACL 40-0.9 MEQ/L-% IV SOLN
INTRAVENOUS | Status: DC
  Administered 2014-05-10: 100 mL/h via INTRAVENOUS
  Filled 2014-05-10 (×5): qty 1000

## 2014-05-10 MED ORDER — FENTANYL CITRATE 0.05 MG/ML IJ SOLN
100.0000 ug | Freq: Once | INTRAMUSCULAR | Status: DC
  Filled 2014-05-10: qty 2

## 2014-05-10 NOTE — ED Notes (Signed)
Internal Medicine at the bedside. 

## 2014-05-10 NOTE — ED Notes (Signed)
MD at the bedside  

## 2014-05-10 NOTE — ED Notes (Signed)
Patient returned from [place]. PLaced back on the monitor.

## 2014-05-10 NOTE — ED Notes (Signed)
Attempted Report x1.   

## 2014-05-10 NOTE — ED Notes (Signed)
PA at the bedside.

## 2014-05-10 NOTE — ED Notes (Signed)
Per EMS, Patient started to have Nausea and vomiting four days ago. Patient states the NV stopped, but the weakness continued. Patient reports having diarrhea for the past four days. Patient was positive with orthostatic vital signs. Vitals per EMS, 119/67, 99 HR, 18 RR, 98% on RA, 139 CBG. Patient reports having poor oral intake and medication intake over the last couple days.

## 2014-05-10 NOTE — H&P (Signed)
[facility] History and Physical                                                                                    Joanne Holmes, is a 69 y.o. female  MRN: [id number]   DOB - [birth year]  Admit Date - 05/10/2014  Outpatient Primary MD for the patient is FULBRIGHT, VIR[place]IA E, PA-C  With History of -  Past Medical History  Diagnosis Date  . Hypertension   . Asthma   . Pneumonia   . COPD (chronic obstructive pulmonary disease)   . Neuromuscular disorder     carpal tunnel- left  . Allergy       Past Surgical History  Procedure Laterality Date  . Tonsillectomy    . Abdominal hysterectomy    . Salpingoophorectomy  ~8 yrs ago  . Back surgery  25+ yrs ago    lumbar laminectomy L5-S1  . Bladder suspension  ~10 yrs ago  . Carpal tunnel release  03/08/2012    Procedure: CARPAL TUNNEL RELEASE;  Surgeon: Cammie Sickle., MD;  Location: [facility];  Service: Orthopedics;  Laterality: Left;    in for   Chief Complaint  Patient presents with  . Fatigue  . Nausea     HPI  Joanne Holmes  is a 69 y.o. female, with a past medical history of hypertension, COPD, and recurrent urinary tract infections who presents to the ER with voluminous diarrhea ever the past 24 hours, nausea vomiting abdominal pain and dehydration. Joanne Holmes reports that she started feeling poorly on Tuesday night with significant chills and fever she had one episode of vomiting. She continued to feel bad but did not vomit. Yesterday she started have brown watery diarrhea. She has had too many episodes to count. This morning she was too weak to stand due to the diarrhea. She has not taken any of her normal medications for the past 2 days. She reports not taking any antibiotics recently, she has not traveled anywhere.  She ate a strange looking orange prior to the diarrhea starting. Joanne Holmes also reports back pain but states she has chronic back pain due to spinal stenosis.  In the ER  she is found to be in acute renal failure with a creatinine of 1.68 up from 0.80 (but that was back in 2013), and a BUN of 37. Her potassium is 9.2, sodium 127, her hemoglobin is 10.3. CT abdomen and pelvis shows stranding throughout the perinephritic fashion bilaterally and mild associated perinephritic fluid of concern for pyelonephritis.  Her first UA was contaminated with squamous cells but did appear infected. I have asked for a repeat UA and culture. Per her husband Joanne Holmes has about 2 urinary tract infections per year. We will [facility] for possible pyelonephritis and severe diarrhea.   Review of Systems   In addition to the HPI above,   No Headache, No changes with Vision or hearing, No problems swallowing food or Liquids, No Chest pain, Cough or Shortness of Breath, No Blood in stool or Urine, + She denies dysuria but reports when her symptoms started Tuesday night she thought  she had a UTI. No new skin rashes or bruises, No new joints pains-aches,   A full 10 point Review of Systems was done, except as stated above, all other Review of Systems were negative.  Social History History  Substance Use Topics  . Smoking status: Former Smoker -- 2.00 packs/day for 20 years    Types: Cigarettes    Quit date: 03/06/1981  . Smokeless tobacco: Not on file  . Alcohol Use: No   she is married, independent with her ADLs and lives at home with her husband.  Family History Her father died with a lung abscess and pneumonia but also had cancer. Her mother had congestive heart failure.  Prior to Admission medications   Medication Sig Start Date End Date Taking? Authorizing Provider  albuterol (PROVENTIL HFA;VENTOLIN HFA) 108 (90 BASE) MCG/ACT inhaler Inhale 2 puffs into the lungs daily as needed for wheezing or shortness of breath.  10/04/13  Yes Historical Provider, MD  amLODipine (NORVASC) 10 MG tablet Take 10 mg by mouth daily.   Yes Historical  Provider, MD  benazepril (LOTENSIN) 40 MG tablet Take 40 mg by mouth 2 (two) times daily.    Yes Historical Provider, MD  Calcium Carb-Cholecalciferol (CALCIUM 1000 + D PO) Take by mouth.   Yes Historical Provider, MD  estradiol (VIVELLE-DOT) 0.0375 MG/24HR Place 1 patch onto the skin once a week. Replace patch on Sunday   Yes Historical Provider, MD  Flaxseed, Linseed, (FLAXSEED OIL) 1030 MG CAPS Take 2 capsules by mouth daily.   Yes Historical Provider, MD  Fluticasone-Salmeterol (ADVAIR DISKUS) 250-50 MCG/DOSE AEPB Inhale 1 puff into the lungs 2 (two) times daily. Patient taking differently: Inhale 2 puffs into the lungs 2 (two) times daily as needed (asthma).  08/18/11  Yes Wendie Agreste, MD  hydrochlorothiazide (HYDRODIURIL) 25 MG tablet Take 25 mg by mouth daily.   Yes Historical Provider, MD  HYDROcodone-acetaminophen (NORCO/VICODIN) 5-325 MG per tablet Take 1 tablet by mouth every 4 (four) hours as needed for moderate pain or severe pain.  03/08/12  Yes Robert J Dasnoit, PA-C  loratadine (CLARITIN) 10 MG tablet Take 10 mg by mouth daily.   Yes Historical Provider, MD  meloxicam (MOBIC) 15 MG tablet Take 15 mg by mouth daily.   Yes Historical Provider, MD  montelukast (SINGULAIR) 10 MG tablet Take 10 mg by mouth at bedtime.   Yes Historical Provider, MD  Omega-3 Fatty Acids (FISH OIL) 1000 MG CAPS Take 2 capsules by mouth daily.   Yes Historical Provider, MD  traMADol (ULTRAM) 50 MG tablet Take 50 mg by mouth 3 (three) times daily. pain   Yes Historical Provider, MD    Allergies  Allergen Reactions  . Ceclor [Cefaclor] Other (See Comments)    Pt does not remember  . Cefzil [Cefprozil] Other (See Comments)    Pt does not remember  . Erythromycin Itching and Rash  . Griseofulvin Swelling  . Morphine And Related Itching  . Penicillins Anaphylaxis    Physical Exam  Vitals  Blood pressure 134/54, pulse 84, temperature 98.8 F (37.1 C), temperature source Oral, resp. rate 13, SpO2  100 %.   General:  Well-developed, obese, very pleasant female lying in bed in NAD, has not bedside  Psych:  Normal affect and insight, Not Suicidal or Homicidal, Awake Alert, Oriented X 3.  Neuro:   No F.N deficits, ALL C.Nerves Intact, Strength 5/5 all 4 extremities, Sensation intact all 4 extremities.  ENT:  Ears and Eyes appear Normal,  Conjunctivae clear, PER. Moist oral mucosa without erythema or exudates.  Neck:  Supple, No lymphadenopathy appreciated  Respiratory:  Symmetrical chest wall movement, Good air movement bilaterally, CTAB.  Cardiac:  RRR, No Murmurs, no LE edema noted, no JVD.    Abdomen:  Positive bowel sounds, Soft, Non tender, Non distended,  No masses appreciated, no CVA tenderness  Skin:  No Cyanosis, Normal Skin Turgor, No Skin Rash or Bruise.  Extremities:  Able to move all 4. 5/5 strength in each,  no effusions.  Data Review  CBC  Recent Labs Lab 05/10/14 1230  WBC 10.2  HGB 10.3*  HCT 29.3*  PLT 183  MCV 80.9  MCH 28.5  MCHC 35.2  RDW 12.6  LYMPHSABS 0.6*  MONOABS 1.3*  EOSABS 0.0  BASOSABS 0.0    Chemistries   Recent Labs Lab 05/10/14 1230  NA 127*  K 2.8*  CL 94*  CO2 21  GLUCOSE 111*  BUN 37*  CREATININE 1.68*  CALCIUM 8.7  AST 39*  ALT 28  ALKPHOS 117  BILITOT 0.9    Urinalysis    Component Value Date/Time   COLORURINE AMBER* 05/10/2014 1449   APPEARANCEUR CLOUDY* 05/10/2014 1449   LABSPEC 1.012 05/10/2014 1449   PHURINE 5.5 05/10/2014 1449   GLUCOSEU NEGATIVE 05/10/2014 1449   HGBUR MODERATE* 05/10/2014 1449   BILIRUBINUR NEGATIVE 05/10/2014 1449   KETONESUR NEGATIVE 05/10/2014 1449   PROTEINUR 30* 05/10/2014 1449   UROBILINOGEN 0.2 05/10/2014 1449   NITRITE NEGATIVE 05/10/2014 1449   LEUKOCYTESUR MODERATE* 05/10/2014 1449    Imaging results:   Ct Abdomen Pelvis Wo Contrast  05/10/2014   CLINICAL DATA:  Nausea and vomiting for 4 days. Elevated serum creatinine  EXAM: CT ABDOMEN AND PELVIS WITHOUT  CONTRAST  TECHNIQUE: Multidetector CT imaging of the abdomen and pelvis was performed following the standard protocol without oral or intravenous contrast maternal administration.  COMPARISON:  None.  FINDINGS: Lung bases are clear. There is a small hiatal hernia. There are scattered foci of coronary artery calcification.  No focal liver lesions are identified on this noncontrast enhanced study. Gallbladder is somewhat contracted. There is no biliary duct dilatation.  Spleen, pancreas, and adrenals appear normal.  There is stranding in the perinephric fascia bilaterally. There is slight perinephric fluid bilaterally. There is no well-defined renal mass. There is no hydronephrosis on either side. There is a 4 x 3 mm calculus in the anterior mid right kidney. There is a 3 x 2 mm calcification in the anterior mid left kidney which may reside within a vessel. There are no ureteral calculi on either side. No evidence of renal abscess.  In the pelvis, the urinary bladder is midline with normal wall thickness. Uterus is absent. There is no pelvic mass or pelvic fluid collection. Appendix is not seen. There is no periappendiceal region inflammation.  There is no bowel obstruction. No free air or portal venous air. There is a small ventral hernia containing only fat.  There is no ascites, adenopathy, or abscess in the abdomen or pelvis. There is atherosclerotic change in the aorta but no aneurysm. There is degenerative change in the lumbar spine. There is grade I/IV anterolisthesis of L4 on L5. There is marked spinal stenosis at this level due to bony overgrowth and diffuse disc protrusion. There is moderate spinal stenosis at L3-4 due to disc protrusion and bony overgrowth. There is borderline spinal stenosis at L5-S1 for similar reasons. No blastic or lytic bone lesions are appreciable.  IMPRESSION:  There is stranding throughout the perinephric fascia bilaterally. The this appearance may be at least in part secondary to  age. There is mild associated perinephric fluid. The possibility of underlying pyelonephritis must be of concern. Appropriate laboratory correlation advised, particularly given elevated serum creatinine.  Spinal stenosis at several levels, most marked at L4-5, multifactorial.  Small hiatal hernia.  Small ventral hernia containing only fat.  No bowel obstruction. No abscess. Appendix not seen. Uterus absent.  Nonobstructing 4 x 3 mm calculus right kidney. 3 x 2 mm calcification left kidney which may represent a focal vascular calcification but could represent a small nonobstructing calculus. No hydronephrosis on either side. No ureteral calculi apparent.   Electronically Signed   By: Lowella Grip III M.D.   On: 05/10/2014 14:30    My personal review of EKG: Pending.   Assessment & Plan  Principal Problem:   Pyelonephritis Active Problems:   Diarrhea   Dehydration   COPD (chronic obstructive pulmonary disease)   Hypokalemia  Possible pyelonephritis Patient with frequent UTIs and CT abdomen pelvis that is somewhat consistent with pyelonephritis.  Symptoms of fever, back pain, and nausea. After discussing antibiotics with pharmacy we have chosen aztreonam 1 mg every 8 hours to treat her potential pyelo-nephritis. Please follow up on repeat UA and culture.  Voluminous diarrhea Will check C. difficile PCR and GI pathogen panel. Per C. difficile order set oral vancomycin has been ordered. Enteric precautions. Avoid PPIs. I am suspicious for another type of infectious diarrhea.  Hypokalemia Potassium 2.8. A total of 6 runs have been ordered. Potassium has been included with IV fluids. Checking magnesium.   Acute renal failure Secondary to dehydration and diarrhea, possibly UTI. Will hydrate. Hold lisinopril, HCTZ and NSAIDs. We do not have a current baseline. Her creatinine in 2013 was 0.80 Bmet in a.m.  Hypertension Continue amlodipine. Holding HCTZ and ACE inhibitor due to acute  renal failure.  Normocytic anemia Patient denies hematemesis, hematochezia and melena. No signs of active bleeding or bruising. Question if she has had chronic kidney disease which may be contributing.  COPD Quiet and stable. Patient not wheezing. We'll continue albuterol inhaler, Advair, and Singulair.   DVT Prophylaxis: Heparin  AM Labs Ordered, also please review Full Orders  Family Communication:   Husband bedside  Code Status:  Full  Condition:  Stable but guarded  Time spent in minutes : [place],  PA-C on 05/10/2014 at 4:04 PM  Between 7am to 7pm - Pager - [phone]  After 7pm go to [email or web address] - password [email or web address]  And look for the night coverage person covering me after hours  [facility]

## 2014-05-10 NOTE — ED Provider Notes (Signed)
Medical screening examination/treatment/procedure(s) were conducted as a shared visit with non-physician practitioner(s) and myself.  I personally evaluated the patient during the encounter.   EKG Interpretation None      Results for orders placed or performed during the hospital encounter of 05/10/14  CBC with Differential  Result Value Ref Range   WBC 10.2 4.0 - 10.5 K/uL   RBC 3.62 (L) 3.87 - 5.11 MIL/uL   Hemoglobin 10.3 (L) 12.0 - 15.0 g/dL   HCT 29.3 (L) 36.0 - 46.0 %   MCV 80.9 78.0 - 100.0 fL   MCH 28.5 26.0 - 34.0 pg   MCHC 35.2 30.0 - 36.0 g/dL   RDW 12.6 11.5 - 15.5 %   Platelets 183 150 - 400 K/uL   Neutrophils Relative % 83 (H) 43 - 77 %   Neutro Abs 8.3 (H) 1.7 - 7.7 K/uL   Lymphocytes Relative 5 (L) 12 - 46 %   Lymphs Abs 0.6 (L) 0.7 - 4.0 K/uL   Monocytes Relative 12 3 - 12 %   Monocytes Absolute 1.3 (H) 0.1 - 1.0 K/uL   Eosinophils Relative 0 0 - 5 %   Eosinophils Absolute 0.0 0.0 - 0.7 K/uL   Basophils Relative 0 0 - 1 %   Basophils Absolute 0.0 0.0 - 0.1 K/uL  Comprehensive metabolic panel  Result Value Ref Range   Sodium 127 (L) 135 - 145 mmol/L   Potassium 2.8 (L) 3.5 - 5.1 mmol/L   Chloride 94 (L) 96 - 112 mmol/L   CO2 21 19 - 32 mmol/L   Glucose, Bld 111 (H) 70 - 99 mg/dL   BUN 37 (H) 6 - 23 mg/dL   Creatinine, Ser 1.68 (H) 0.50 - 1.10 mg/dL   Calcium 8.7 8.4 - 10.5 mg/dL   Total Protein 5.7 (L) 6.0 - 8.3 g/dL   Albumin 2.2 (L) 3.5 - 5.2 g/dL   AST 39 (H) 0 - 37 U/L   ALT 28 0 - 35 U/L   Alkaline Phosphatase 117 39 - 117 U/L   Total Bilirubin 0.9 0.3 - 1.2 mg/dL   GFR calc non Af Amer 30 (L) >90 mL/min   GFR calc Af Amer 34 (L) >90 mL/min   Anion gap 12 5 - 15   Patient became ill on Tuesday night with chills. Had nausea and mostly dry heaves but multiple episodes of diarrhea. Patient here with electrolyte abnormalities. Patient without recent admission to the hospital and has not been on antibiotics recently. C. difficile precautions will be  important. Patient's abdomen is soft and nontender. Patient's had some mild abdominal discomfort. Patient will require admission. Patient receiving IV potassium supplements. And patient receiving normal saline fluid for the hyponatremia and hypokalemia.  Fredia Sorrow, MD 05/10/14 1429

## 2014-05-10 NOTE — ED Provider Notes (Signed)
CSN: [id number]     Arrival date & time 05/10/14  1138 History   First MD Initiated Contact with Patient 05/10/14 1201     Chief Complaint  Patient presents with  . Fatigue  . Nausea     (Consider location/radiation/quality/duration/timing/severity/associated sxs/prior Treatment) HPI Comments: Patient is a 69 yo F PMHx significant for HTN, COPD presenting to the ED for four days of nausea, non-bloody non-bilious emesis x 1 with generalized abdominal pain and multiple episodes of nonbloody nonbilious diarrhea that began today. No modifying factors identified. No over-the-counter medications tried prior to arrival. Patient endorses decreased by mouth intake. No sick contacts. Abdominal surgical history includes total hysterectomy and bladder suspension. Patient states she ate some oranges, and wonders if this may be the source of her symptoms.    Past Medical History  Diagnosis Date  . Hypertension   . Asthma   . Pneumonia   . COPD (chronic obstructive pulmonary disease)   . Neuromuscular disorder     carpal tunnel- left  . Allergy    Past Surgical History  Procedure Laterality Date  . Tonsillectomy    . Abdominal hysterectomy    . Salpingoophorectomy  ~8 yrs ago  . Back surgery  25+ yrs ago    lumbar laminectomy L5-S1  . Bladder suspension  ~10 yrs ago  . Carpal tunnel release  03/08/2012    Procedure: CARPAL TUNNEL RELEASE;  Surgeon: Cammie Sickle., MD;  Location: [employer];  Service: Orthopedics;  Laterality: Left;   No family history on file. History  Substance Use Topics  . Smoking status: Former Smoker -- 2.00 packs/day for 20 years    Types: Cigarettes    Quit date: 03/06/1981  . Smokeless tobacco: Not on file  . Alcohol Use: No   OB History    No data available     Review of Systems  Constitutional: Positive for chills.  Gastrointestinal: Positive for nausea, vomiting, abdominal pain and diarrhea.  Genitourinary: Negative for dysuria,  urgency, frequency, hematuria and flank pain.  All other systems reviewed and are negative.     Allergies  Ceclor; Cefzil; Erythromycin; Griseofulvin; Morphine and related; and Penicillins  Home Medications   Prior to Admission medications   Medication Sig Start Date End Date Taking? Authorizing Provider  amLODipine (NORVASC) 10 MG tablet Take 10 mg by mouth daily.    Historical Provider, MD  benazepril (LOTENSIN) 40 MG tablet Take 40 mg by mouth 2 (two) times daily. Pt states she takes two tablet daily    Historical Provider, MD  Calcium Carb-Cholecalciferol (CALCIUM 1000 + D PO) Take by mouth.    Historical Provider, MD  estradiol (VIVELLE-DOT) 0.0375 MG/24HR Place 1 patch onto the skin 2 (two) times a week.    Historical Provider, MD  Flaxseed, Linseed, (FLAXSEED OIL) 1030 MG CAPS Take 2 capsules by mouth daily.    Historical Provider, MD  Fluticasone-Salmeterol (ADVAIR DISKUS) 250-50 MCG/DOSE AEPB Inhale 1 puff into the lungs 2 (two) times daily. 08/18/11   Wendie Agreste, MD  hydrochlorothiazide (HYDRODIURIL) 25 MG tablet Take 25 mg by mouth daily.    Historical Provider, MD  HYDROcodone-acetaminophen (NORCO/VICODIN) 5-325 MG per tablet Take 1 tablet by mouth every 4 (four) hours as needed. 1 or 2 tabs every 4 hours as needed for pain 03/08/12   Marily Lente Dasnoit, PA-C  loratadine (CLARITIN) 10 MG tablet Take 10 mg by mouth daily.    Historical Provider, MD  meloxicam (MOBIC) 15  MG tablet Take 15 mg by mouth daily.    Historical Provider, MD  montelukast (SINGULAIR) 10 MG tablet Take 10 mg by mouth at bedtime.    Historical Provider, MD  Omega-3 Fatty Acids (FISH OIL) 1000 MG CAPS Take 2 capsules by mouth daily.    Historical Provider, MD  traMADol (ULTRAM) 50 MG tablet Take 50 mg by mouth every 6 (six) hours as needed. pain    Historical Provider, MD   BP 134/54 mmHg  Pulse 84  Temp(Src) 98.8 F (37.1 C) (Oral)  Resp 13  SpO2 100% Physical Exam  Constitutional: She is oriented  to person, place, and time. She appears well-developed and well-nourished. No distress.  HENT:  Head: Normocephalic and atraumatic.  Right Ear: External ear normal.  Left Ear: External ear normal.  Nose: Nose normal.  Mouth/Throat: Uvula is midline and oropharynx is clear and moist. Mucous membranes are dry.  Eyes: Conjunctivae are normal.  Neck: Neck supple.  Cardiovascular: Normal rate, regular rhythm and normal heart sounds.   Pulmonary/Chest: Effort normal and breath sounds normal.  Abdominal: Soft. Bowel sounds are normal. She exhibits no distension. There is tenderness. There is no rebound and no guarding.  Musculoskeletal: Normal range of motion. She exhibits no edema.  Neurological: She is alert and oriented to person, place, and time.  Skin: Skin is warm and dry. She is not diaphoretic.  Nursing note and vitals reviewed.   ED Course  Procedures (including critical care time) Medications  fentaNYL (SUBLIMAZE) injection 100 mcg (100 mcg Intravenous Not Given 05/10/14 1224)  potassium chloride 10 mEq in 100 mL IVPB (10 mEq Intravenous New Bag/Given 05/10/14 1527)  ondansetron (ZOFRAN) injection 4 mg (4 mg Intravenous Given 05/10/14 1225)  iohexol (OMNIPAQUE) 300 MG/ML solution 25 mL (25 mLs Oral Contrast Given 05/10/14 1231)  potassium chloride 10 mEq in 100 mL IVPB (0 mEq Intravenous Stopped 05/10/14 1516)  sodium chloride 0.9 % bolus 1,000 mL (0 mLs Intravenous Stopped 05/10/14 1516)    Labs Review Labs Reviewed  CBC WITH DIFFERENTIAL/PLATELET - Abnormal; Notable for the following:    RBC 3.62 (*)    Hemoglobin 10.3 (*)    HCT 29.3 (*)    Neutrophils Relative % 83 (*)    Neutro Abs 8.3 (*)    Lymphocytes Relative 5 (*)    Lymphs Abs 0.6 (*)    Monocytes Absolute 1.3 (*)    All other components within normal limits  COMPREHENSIVE METABOLIC PANEL - Abnormal; Notable for the following:    Sodium 127 (*)    Potassium 2.8 (*)    Chloride 94 (*)    Glucose, Bld 111 (*)     BUN 37 (*)    Creatinine, Ser 1.68 (*)    Total Protein 5.7 (*)    Albumin 2.2 (*)    AST 39 (*)    GFR calc non Af Amer 30 (*)    GFR calc Af Amer 34 (*)    All other components within normal limits  URINALYSIS, ROUTINE W REFLEX MICROSCOPIC - Abnormal; Notable for the following:    Color, Urine AMBER (*)    APPearance CLOUDY (*)    Hgb urine dipstick MODERATE (*)    Protein, ur 30 (*)    Leukocytes, UA MODERATE (*)    All other components within normal limits  URINE MICROSCOPIC-ADD ON - Abnormal; Notable for the following:    Squamous Epithelial / LPF MANY (*)    Bacteria, UA MANY (*)  All other components within normal limits  URINE CULTURE  URINE CULTURE  GI PATHOGEN PANEL BY PCR, STOOL  URINALYSIS, ROUTINE W REFLEX MICROSCOPIC    Imaging Review Ct Abdomen Pelvis Wo Contrast  05/10/2014   CLINICAL DATA:  Nausea and vomiting for 4 days. Elevated serum creatinine  EXAM: CT ABDOMEN AND PELVIS WITHOUT CONTRAST  TECHNIQUE: Multidetector CT imaging of the abdomen and pelvis was performed following the standard protocol without oral or intravenous contrast maternal administration.  COMPARISON:  None.  FINDINGS: Lung bases are clear. There is a small hiatal hernia. There are scattered foci of coronary artery calcification.  No focal liver lesions are identified on this noncontrast enhanced study. Gallbladder is somewhat contracted. There is no biliary duct dilatation.  Spleen, pancreas, and adrenals appear normal.  There is stranding in the perinephric fascia bilaterally. There is slight perinephric fluid bilaterally. There is no well-defined renal mass. There is no hydronephrosis on either side. There is a 4 x 3 mm calculus in the anterior mid right kidney. There is a 3 x 2 mm calcification in the anterior mid left kidney which may reside within a vessel. There are no ureteral calculi on either side. No evidence of renal abscess.  In the pelvis, the urinary bladder is midline with normal  wall thickness. Uterus is absent. There is no pelvic mass or pelvic fluid collection. Appendix is not seen. There is no periappendiceal region inflammation.  There is no bowel obstruction. No free air or portal venous air. There is a small ventral hernia containing only fat.  There is no ascites, adenopathy, or abscess in the abdomen or pelvis. There is atherosclerotic change in the aorta but no aneurysm. There is degenerative change in the lumbar spine. There is grade I/IV anterolisthesis of L4 on L5. There is marked spinal stenosis at this level due to bony overgrowth and diffuse disc protrusion. There is moderate spinal stenosis at L3-4 due to disc protrusion and bony overgrowth. There is borderline spinal stenosis at L5-S1 for similar reasons. No blastic or lytic bone lesions are appreciable.  IMPRESSION: There is stranding throughout the perinephric fascia bilaterally. The this appearance may be at least in part secondary to age. There is mild associated perinephric fluid. The possibility of underlying pyelonephritis must be of concern. Appropriate laboratory correlation advised, particularly given elevated serum creatinine.  Spinal stenosis at several levels, most marked at L4-5, multifactorial.  Small hiatal hernia.  Small ventral hernia containing only fat.  No bowel obstruction. No abscess. Appendix not seen. Uterus absent.  Nonobstructing 4 x 3 mm calculus right kidney. 3 x 2 mm calcification left kidney which may represent a focal vascular calcification but could represent a small nonobstructing calculus. No hydronephrosis on either side. No ureteral calculi apparent.   Electronically Signed   By: Lowella Grip III M.D.   On: 05/10/2014 14:30     EKG Interpretation None      MDM   Final diagnoses:  Abdominal pain in female  Dehydration    Filed Vitals:   05/10/14 1500  BP: 134/54  Pulse: 84  Temp:   Resp: 13   Afebrile, NAD, non-toxic appearing, AAOx4.  I have reviewed nursing  notes, vital signs, and all appropriate lab and imaging results for this patient. Patient appears clinically dehydrated, mucous membranes are dry. She has generalized mild abdominal tenderness without peritoneal signs. Labs reviewed, electrolyte abnormality likely secondary to GI loss. IV potassium replacement will be given. IV fluids will be given. Pain  and nausea improved with pain medication and nausea medication. CT scan only remarkable for nonobstructing calculus question of pyelonephritis, patient has no urinary symptoms. Will admit patient to hospitalist for dehydration. Patient d/w with Dr. Rogene Houston, agrees with plan.       Harlow Mares, PA-C 05/10/14 1556

## 2014-05-11 ENCOUNTER — Inpatient Hospital Stay (HOSPITAL_COMMUNITY): Payer: Medicare HMO

## 2014-05-11 DIAGNOSIS — N179 Acute kidney failure, unspecified: Secondary | ICD-10-CM | POA: Diagnosis present

## 2014-05-11 DIAGNOSIS — I10 Essential (primary) hypertension: Secondary | ICD-10-CM | POA: Diagnosis present

## 2014-05-11 LAB — BASIC METABOLIC PANEL
Anion gap: 6 (ref 5–15)
BUN: 32 mg/dL — ABNORMAL HIGH (ref 6–23)
CALCIUM: 8.3 mg/dL — AB (ref 8.4–10.5)
CHLORIDE: 102 mmol/L (ref 96–112)
CO2: 22 mmol/L (ref 19–32)
Creatinine, Ser: 1.27 mg/dL — ABNORMAL HIGH (ref 0.50–1.10)
GFR, EST AFRICAN AMERICAN: 48 mL/min — AB (ref >90)
GFR, EST NON AFRICAN AMERICAN: 41 mL/min — AB (ref >90)
Glucose, Bld: 93 mg/dL (ref 70–99)
POTASSIUM: 3.8 mmol/L (ref 3.5–5.1)
Sodium: 130 mmol/L — ABNORMAL LOW (ref 135–145)

## 2014-05-11 LAB — CBC
HEMATOCRIT: 27.6 % — AB (ref 36.0–46.0)
Hemoglobin: 10 g/dL — ABNORMAL LOW (ref 12.0–15.0)
MCH: 29.2 pg (ref 26.0–34.0)
MCHC: 36.2 g/dL — ABNORMAL HIGH (ref 30.0–36.0)
MCV: 80.5 fL (ref 78.0–100.0)
PLATELETS: 190 10*3/uL (ref 150–400)
RBC: 3.43 MIL/uL — ABNORMAL LOW (ref 3.87–5.11)
RDW: 13 % (ref 11.5–15.5)
WBC: 9.1 10*3/uL (ref 4.0–10.5)

## 2014-05-11 NOTE — Evaluation (Signed)
Physical Therapy Evaluation Patient Details Name: Joanne Holmes MRN: [id number] DOB: Oct 06, 1945 Today's Date: 05/11/2014   History of Present Illness  Patient is a 69 y/o female with PMH of hypertension, COPD, and recurrent UTIs who presents to the ER with voluminous diarrhea ever the past 24 hours, nausea/vomiting, abdominal pain and dehydration. CT abdomen and pelvis shows stranding throughout the perinephritic fashion bilaterally and mild associated perinephritic fluid of concern for pyelonephritis.     Clinical Impression  Patient presents with functional limitations due to deficits listed in PT problem list (See below). Pt with generalized weakness and balance deficits impacting safe mobility. Tolerated ambulation with mild drifting and unsteadiness. Anticipate mobility and balance will improve with nutrition and continued activity. Encourage ambulation with RN daily with use of RW. Would benefit from skilled PT to improve overall mobility so pt can return to PLOF.    Follow Up Recommendations No PT follow up;Supervision for mobility/OOB    Equipment Recommendations  None recommended by PT    Recommendations for Other Services       Precautions / Restrictions Precautions Precautions: Fall Restrictions Weight Bearing Restrictions: No      Mobility  Bed Mobility               General bed mobility comments: Pt sitting EOB upon PT arrival.   Transfers Overall transfer level: Needs assistance Equipment used: None Transfers: Sit to/from Stand Sit to Stand: Min guard         General transfer comment: Min guard for safety as pt unsteady upon standing.  Ambulation/Gait Ambulation/Gait assistance: Min guard Ambulation Distance (Feet): 150 Feet Assistive device: None Gait Pattern/deviations: Step-through pattern;Decreased stride length;Drifts right/left   Gait velocity interpretation: Below normal speed for age/gender General Gait Details: pt with slow, unsteady  gait with occasional drifting and need to hold onto rail in hallway for support. Furniture walking noted in room.  Stairs            Wheelchair Mobility    Modified Rankin (Stroke Patients Only)       Balance Overall balance assessment: Needs assistance Sitting-balance support: Feet supported;No upper extremity supported Sitting balance-Leahy Scale: Good     Standing balance support: During functional activity Standing balance-Leahy Scale: Fair                               Pertinent Vitals/Pain Pain Assessment: 0-10 Pain Score:  (not rated on pain scale.) Pain Location: chronic back pain. Pain Descriptors / Indicators: Sore;Aching Pain Intervention(s): Monitored during session;Repositioned    Home Living Family/patient expects to be discharged to:: Private residence Living Arrangements: Spouse/significant other Available Help at Discharge: Family;Available 24 hours/day Type of Home: House Home Access: Stairs to enter Entrance Stairs-Rails: None Entrance Stairs-Number of Steps: 1 Home Layout: Two level Home Equipment: Walker - 2 wheels      Prior Function Level of Independence: Independent         Comments: Pt drives.      Hand Dominance        Extremity/Trunk Assessment   Upper Extremity Assessment: Defer to OT evaluation           Lower Extremity Assessment: Generalized weakness         Communication   Communication: No difficulties  Cognition Arousal/Alertness: Awake/alert Behavior During Therapy: WFL for tasks assessed/performed Overall Cognitive Status: Within Functional Limits for tasks assessed  General Comments      Exercises        Assessment/Plan    PT Assessment Patient needs continued PT services  PT Diagnosis Generalized weakness;Difficulty walking   PT Problem List Decreased strength;Decreased balance;Decreased mobility  PT Treatment Interventions Balance  training;Patient/family education;Functional mobility training;Therapeutic activities;Therapeutic exercise;Stair training;Gait training   PT Goals (Current goals can be found in the Care Plan section) Acute Rehab PT Goals Patient Stated Goal: to get stronger and eat something PT Goal Formulation: With patient Time For Goal Achievement: 05/25/14 Potential to Achieve Goals: Fair    Frequency Min 3X/week   Barriers to discharge        Co-evaluation               End of Session Equipment Utilized During Treatment: Gait belt Activity Tolerance: Patient tolerated treatment well Patient left: in chair;with call bell/phone within reach;with family/visitor present Nurse Communication: Mobility status         Time: 0950-1005 PT Time Calculation (min) (ACUTE ONLY): 15 min   Charges:   PT Evaluation $Initial PT Evaluation Tier I: 1 Procedure     PT G CodesCandy Sledge A June 03, 2014, 11:03 AM Candy Sledge, PT, DPT [phone]

## 2014-05-11 NOTE — Progress Notes (Addendum)
[facility] PROGRESS NOTE  Joanne Holmes [id number] DOB: 1945/07/25 DOA: 05/10/2014 PCP: [name], PA-C  Summary I have seen and examined Ms Joanne Holmes at bedside in the presence of her family(husband and daughter), and reveiwed her chart. Joanne Holmes is a very pleasant 69 y.o. female, with essential hypertension, COPD, and recurrent urinary tract infections who presented to the ER with voluminous diarrhea,nausea, vomiting, abdominal pain and chills associated with poor appetite for a couple of days prior to the admission and she was found to have acute pyelonephritis/acute kidney injury and her diarrhea was felt to be likely noninfectious. CT abdomen and pelvis showed "There is stranding throughout the perinephric fascia bilaterally. The this appearance may be at least in part secondary to age. There is mild associated perinephric fluid. The possibility of underlying pyelonephritis must be of concern. Appropriate laboratory correlation advised, particularly given elevated serum creatinine. Spinal stenosis at several levels, most marked at L4-5, multifactorial. Small hiatal hernia. Small ventral hernia containing only fat. No bowel obstruction. No abscess. Appendix not seen. Uterus absent. Nonobstructing 4 x 3 mm calculus right kidney. 3 x 2 mm calcification left kidney which may represent a focal vascular calcification but could represent a small nonobstructing calculus. No hydronephrosis on either side. No ureteral calculi apparent". Her diarrhea is improving. BUN/creatinine has improved to 32/1.27 with ACE inhibitor/diuretic on hold. She is on ciprofloxacin. Urine culture results pending. She feels hungry. Will advance her diet and continue current antibiotics. She probably does not need contact precautions. If she tolerates diet, she can potentially discharge in the next 24-48 hours and she will need to follow with urology outpatient. Plan Pyelonephritis  Follow urine  culture results  Day 2 Ciprofloxacin Dehydration/Hypokalemia/Diarrhea/AKI/essential hypertension  IV fluids  Replenish electrolytes as necessary  Advance diet  ACE inhibitor/diuretic on hold in view of a care. COPD (chronic obstructive pulmonary disease)  Stable  Chest x-ray normal Code Status: Full code Family Communication: Husband and daughter at bedside Disposition Plan: Eventually home   Consultants:  None  Procedures:  None  Antibiotics:  Ciprofloxacin 05/10/2014>  HPI/Subjective: Feels better. She is hungry.  Objective: Filed Vitals:   05/11/14 1300  BP: 134/49  Pulse: 86  Temp: 98.1 F (36.7 C)  Resp: 18    Intake/Output Summary (Last 24 hours) at 05/11/14 1800 Last data filed at 05/11/14 1300  Gross per 24 hour  Intake   1280 ml  Output      0 ml  Net   1280 ml   There were no vitals filed for this visit.  Exam:   General:  Comfortable at rest.  Cardiovascular: S1-S2 normal. No murmurs. Pulse regular.  Respiratory: Good air entry bilaterally. No rhonchi or rales.  Abdomen: Soft and nontender. Normal bowel sounds. No organomegaly.  Musculoskeletal: No pedal edema   Neurological: Intact  Data Reviewed: Basic Metabolic Panel:  Recent Labs Lab 05/10/14 1230 05/10/14 1830 05/11/14 0645  NA 127*  --  130*  K 2.8*  --  3.8  CL 94*  --  102  CO2 21  --  22  GLUCOSE 111*  --  93  BUN 37*  --  32*  CREATININE 1.68*  --  1.27*  CALCIUM 8.7  --  8.3*  MG  --  2.0  --    Liver Function Tests:  Recent Labs Lab 05/10/14 1230  AST 39*  ALT 28  ALKPHOS 117  BILITOT 0.9  PROT 5.7*  ALBUMIN 2.2*   No results  for input(s): LIPASE, AMYLASE in the last 168 hours. No results for input(s): AMMONIA in the last 168 hours. CBC:  Recent Labs Lab 05/10/14 1230 05/11/14 0645  WBC 10.2 9.1  NEUTROABS 8.3*  --   HGB 10.3* 10.0*  HCT 29.3* 27.6*  MCV 80.9 80.5  PLT 183 190   Cardiac Enzymes: No results for input(s): CKTOTAL,  CKMB, CKMBINDEX, TROPONINI in the last 168 hours. BNP (last 3 results) No results for input(s): BNP in the last 8760 hours.  ProBNP (last 3 results) No results for input(s): PROBNP in the last 8760 hours.  CBG: No results for input(s): GLUCAP in the last 168 hours.  No results found for this or any previous visit (from the past 240 hour(s)).   Studies: Ct Abdomen Pelvis Wo Contrast  05/10/2014   CLINICAL DATA:  Nausea and vomiting for 4 days. Elevated serum creatinine  EXAM: CT ABDOMEN AND PELVIS WITHOUT CONTRAST  TECHNIQUE: Multidetector CT imaging of the abdomen and pelvis was performed following the standard protocol without oral or intravenous contrast maternal administration.  COMPARISON:  None.  FINDINGS: Lung bases are clear. There is a small hiatal hernia. There are scattered foci of coronary artery calcification.  No focal liver lesions are identified on this noncontrast enhanced study. Gallbladder is somewhat contracted. There is no biliary duct dilatation.  Spleen, pancreas, and adrenals appear normal.  There is stranding in the perinephric fascia bilaterally. There is slight perinephric fluid bilaterally. There is no well-defined renal mass. There is no hydronephrosis on either side. There is a 4 x 3 mm calculus in the anterior mid right kidney. There is a 3 x 2 mm calcification in the anterior mid left kidney which may reside within a vessel. There are no ureteral calculi on either side. No evidence of renal abscess.  In the pelvis, the urinary bladder is midline with normal wall thickness. Uterus is absent. There is no pelvic mass or pelvic fluid collection. Appendix is not seen. There is no periappendiceal region inflammation.  There is no bowel obstruction. No free air or portal venous air. There is a small ventral hernia containing only fat.  There is no ascites, adenopathy, or abscess in the abdomen or pelvis. There is atherosclerotic change in the aorta but no aneurysm. There is  degenerative change in the lumbar spine. There is grade I/IV anterolisthesis of L4 on L5. There is marked spinal stenosis at this level due to bony overgrowth and diffuse disc protrusion. There is moderate spinal stenosis at L3-4 due to disc protrusion and bony overgrowth. There is borderline spinal stenosis at L5-S1 for similar reasons. No blastic or lytic bone lesions are appreciable.  IMPRESSION: There is stranding throughout the perinephric fascia bilaterally. The this appearance may be at least in part secondary to age. There is mild associated perinephric fluid. The possibility of underlying pyelonephritis must be of concern. Appropriate laboratory correlation advised, particularly given elevated serum creatinine.  Spinal stenosis at several levels, most marked at L4-5, multifactorial.  Small hiatal hernia.  Small ventral hernia containing only fat.  No bowel obstruction. No abscess. Appendix not seen. Uterus absent.  Nonobstructing 4 x 3 mm calculus right kidney. 3 x 2 mm calcification left kidney which may represent a focal vascular calcification but could represent a small nonobstructing calculus. No hydronephrosis on either side. No ureteral calculi apparent.   Electronically Signed   By: Lowella Grip III M.D.   On: 05/10/2014 14:30   Dg Chest [facility]  05/11/2014   CLINICAL DATA:  Two days of fever with vomiting and diarrhea, history of COPD hypertension and pneumonia  EXAM: PORTABLE CHEST - 1 VIEW  COMPARISON:  PA and lateral chest x-ray dated Aug 18, 2011  FINDINGS: The lungs are adequately inflated. There is no focal infiltrate. There is no pleural effusion or pneumothorax. The heart and pulmonary vascularity are normal. The mediastinum is normal in width. The bony thorax exhibits no acute abnormality.  IMPRESSION: There is no active cardiopulmonary disease.   Electronically Signed   By: David  Martinique   On: 05/11/2014 08:03    Scheduled Meds: . amLODipine  10 mg Oral Daily  .  ciprofloxacin  400 mg Intravenous Q12H  . estradiol  0.0375 mg Transdermal Q Mon  . heparin  5,000 Units Subcutaneous 3 times per day  . mometasone-formoterol  2 puff Inhalation BID  . montelukast  10 mg Oral QHS  . omega-3 acid ethyl esters  1 g Oral Daily  . sodium chloride  3 mL Intravenous Q12H   Continuous Infusions: . 0.9 % NaCl with KCl 40 mEq / L 100 mL/hr (05/10/14 1851)     Time spent: 25 minutes    Olof Marcil  [facility] Pager [phone]. If 7PM-7AM, please contact night-coverage at [email or web address], password [email or web address] 05/11/2014, 6:00 PM  LOS: 1 day

## 2014-05-12 ENCOUNTER — Encounter (HOSPITAL_COMMUNITY): Payer: Self-pay | Admitting: General Practice

## 2014-05-12 LAB — COMPREHENSIVE METABOLIC PANEL
ALBUMIN: 2.5 g/dL — AB (ref 3.5–5.2)
ALT: 35 U/L (ref 0–35)
AST: 38 U/L — ABNORMAL HIGH (ref 0–37)
Alkaline Phosphatase: 122 U/L — ABNORMAL HIGH (ref 39–117)
Anion gap: 12 (ref 5–15)
BUN: 21 mg/dL (ref 6–23)
CO2: 17 mmol/L — ABNORMAL LOW (ref 19–32)
CREATININE: 1.14 mg/dL — AB (ref 0.50–1.10)
Calcium: 8.8 mg/dL (ref 8.4–10.5)
Chloride: 104 mmol/L (ref 96–112)
GFR calc Af Amer: 55 mL/min — ABNORMAL LOW (ref >90)
GFR, EST NON AFRICAN AMERICAN: 47 mL/min — AB (ref >90)
Glucose, Bld: 104 mg/dL — ABNORMAL HIGH (ref 70–99)
Potassium: 3.8 mmol/L (ref 3.5–5.1)
Sodium: 133 mmol/L — ABNORMAL LOW (ref 135–145)
TOTAL PROTEIN: 6 g/dL (ref 6.0–8.3)
Total Bilirubin: 0.6 mg/dL (ref 0.3–1.2)

## 2014-05-12 LAB — URINE CULTURE

## 2014-05-12 LAB — CBC
HCT: 31.2 % — ABNORMAL LOW (ref 36.0–46.0)
Hemoglobin: 10.7 g/dL — ABNORMAL LOW (ref 12.0–15.0)
MCH: 28.9 pg (ref 26.0–34.0)
MCHC: 34.3 g/dL (ref 30.0–36.0)
MCV: 84.3 fL (ref 78.0–100.0)
Platelets: 307 10*3/uL (ref 150–400)
RBC: 3.7 MIL/uL — ABNORMAL LOW (ref 3.87–5.11)
RDW: 13.5 % (ref 11.5–15.5)
WBC: 7.2 10*3/uL (ref 4.0–10.5)

## 2014-05-12 MED ORDER — CIPROFLOXACIN HCL 500 MG PO TABS
500.0000 mg | ORAL_TABLET | Freq: Two times a day (BID) | ORAL | Status: DC
  Administered 2014-05-12: 500 mg via ORAL
  Filled 2014-05-12: qty 1

## 2014-05-12 MED ORDER — CIPROFLOXACIN HCL 500 MG PO TABS: 500.0000 mg | ORAL_TABLET | Freq: Two times a day (BID) | ORAL | Status: DC

## 2014-05-12 NOTE — Discharge Summary (Signed)
Joanne Holmes, is a 69 y.o. female  DOB 1945-04-28  MRN [id number].  Admission date:  05/10/2014  Admitting Physician  Annita Brod, MD  Discharge Date:  05/12/2014   Primary MD  [name], PA-C  Recommendations for primary care physician for things to follow:   Please follow gram negative rod ID and sensitivity from urine culture, if need be change antibiotics.  Follow renal function and decide whether to resume ACEI/diuretic/Mobic.  Refer to Urology from nephrolithiasis.   Admission Diagnosis   Dehydration [E86.0] Abdominal pain in female [R10.9]   Discharge Diagnosis  Dehydration [E86.0] Abdominal pain in female [R10.9]   COPD (chronic obstructive pulmonary disease)  Essential hypertension  Gram-negative rod UTI  Acute kidney injury  Hospital Course  Joanne Holmes is a very pleasant 69 y.o. female, with essential hypertension, COPD, and recurrent urinary tract infections who presented to the ER with voluminous diarrhea,nausea, vomiting, abdominal pain and chills associated with poor appetite for a couple of days prior to the admission and she was found to have acute pyelonephritis/acute kidney injury and her diarrhea was felt to be likely noninfectious. CT abdomen and pelvis showed "There is stranding throughout the perinephric fascia bilaterally. The this appearance may be at least in part secondary to age. There is mild associated perinephric fluid. The possibility of underlying pyelonephritis must be of concern. Appropriate laboratory correlation advised, particularly given elevated serum creatinine. Spinal stenosis at several levels, most marked at L4-5, multifactorial. Small hiatal hernia. Small ventral hernia containing only fat. No bowel obstruction. No abscess. Appendix not seen.  Uterus absent. Nonobstructing 4 x 3 mm calculus right kidney. 3 x 2 mm calcification left kidney which may represent a focal vascular calcification but could represent a small nonobstructing calculus. No hydronephrosis on either side. No ureteral calculi apparent". Her diarrhea has resolved. BUN/creatinine has improved to 32/1.14 with ACE inhibitor/diuretic on hold. She is on Ciprofloxacin to complete 8 more days. Urine culture results significant for gram-negative rods with ID and sensitivity pending, likely this is Escherichia coli. She feels close to her baseline. We'll therefore discharge her on ciprofloxacin to follow-up with her PCP in the next week. Will need to follow urine culture results in case need to change antibiotics although ciprofloxacin seems to be doing the job. She will also need urology follow-up for day nonobstructing left renal stone. I have held benazepril/HCTZ/Mobic in view of acute kidney injury but this could potentially be resumed once she is back to her baseline. She is discharged in stable condition.   Discharge Condition Stable.  Consults obtained  None  Follow UP PCP Urology   Discharge Instructions  and  Discharge Medications  Discharge Instructions    Diet - low sodium heart healthy    Complete by:  As directed      Increase activity slowly    Complete by:  As directed             Medication List    STOP taking these medications  benazepril 40 MG tablet  Commonly known as:  LOTENSIN     hydrochlorothiazide 25 MG tablet  Commonly known as:  HYDRODIURIL     meloxicam 15 MG tablet  Commonly known as:  MOBIC      TAKE these medications        albuterol 108 (90 BASE) MCG/ACT inhaler  Commonly known as:  PROVENTIL HFA;VENTOLIN HFA  Inhale 2 puffs into the lungs daily as needed for wheezing or shortness of breath.     amLODipine 10 MG tablet  Commonly known as:  NORVASC  Take 10 mg by mouth daily.     CALCIUM 1000 + D PO  Take by  mouth.     ciprofloxacin 500 MG tablet  Commonly known as:  CIPRO  Take 1 tablet (500 mg total) by mouth 2 (two) times daily.     CLARITIN 10 MG tablet  Generic drug:  loratadine  Take 10 mg by mouth daily.     estradiol 0.0375 MG/24HR  Commonly known as:  VIVELLE-DOT  Place 1 patch onto the skin once a week. Replace patch on Sunday     Fish Oil 1000 MG Caps  Take 2 capsules by mouth daily.     Flaxseed Oil 1030 MG Caps  Take 2 capsules by mouth daily.     Fluticasone-Salmeterol 250-50 MCG/DOSE Aepb  Commonly known as:  ADVAIR DISKUS  Inhale 1 puff into the lungs 2 (two) times daily.     HYDROcodone-acetaminophen 5-325 MG per tablet  Commonly known as:  NORCO/VICODIN  Take 1 tablet by mouth every 4 (four) hours as needed for moderate pain or severe pain.     montelukast 10 MG tablet  Commonly known as:  SINGULAIR  Take 10 mg by mouth at bedtime.     traMADol 50 MG tablet  Commonly known as:  ULTRAM  Take 50 mg by mouth 3 (three) times daily. pain        Diet and Activity recommendation: See Discharge Instructions above  Major procedures and Radiology Reports - PLEASE review detailed and final reports for all details, in brief -    Ct Abdomen Pelvis Wo Contrast  05/10/2014   CLINICAL DATA:  Nausea and vomiting for 4 days. Elevated serum creatinine  EXAM: CT ABDOMEN AND PELVIS WITHOUT CONTRAST  TECHNIQUE: Multidetector CT imaging of the abdomen and pelvis was performed following the standard protocol without oral or intravenous contrast maternal administration.  COMPARISON:  None.  FINDINGS: Lung bases are clear. There is a small hiatal hernia. There are scattered foci of coronary artery calcification.  No focal liver lesions are identified on this noncontrast enhanced study. Gallbladder is somewhat contracted. There is no biliary duct dilatation.  Spleen, pancreas, and adrenals appear normal.  There is stranding in the perinephric fascia bilaterally. There is slight  perinephric fluid bilaterally. There is no well-defined renal mass. There is no hydronephrosis on either side. There is a 4 x 3 mm calculus in the anterior mid right kidney. There is a 3 x 2 mm calcification in the anterior mid left kidney which may reside within a vessel. There are no ureteral calculi on either side. No evidence of renal abscess.  In the pelvis, the urinary bladder is midline with normal wall thickness. Uterus is absent. There is no pelvic mass or pelvic fluid collection. Appendix is not seen. There is no periappendiceal region inflammation.  There is no bowel obstruction. No free air or portal venous air. There is a small  ventral hernia containing only fat.  There is no ascites, adenopathy, or abscess in the abdomen or pelvis. There is atherosclerotic change in the aorta but no aneurysm. There is degenerative change in the lumbar spine. There is grade I/IV anterolisthesis of L4 on L5. There is marked spinal stenosis at this level due to bony overgrowth and diffuse disc protrusion. There is moderate spinal stenosis at L3-4 due to disc protrusion and bony overgrowth. There is borderline spinal stenosis at L5-S1 for similar reasons. No blastic or lytic bone lesions are appreciable.  IMPRESSION: There is stranding throughout the perinephric fascia bilaterally. The this appearance may be at least in part secondary to age. There is mild associated perinephric fluid. The possibility of underlying pyelonephritis must be of concern. Appropriate laboratory correlation advised, particularly given elevated serum creatinine.  Spinal stenosis at several levels, most marked at L4-5, multifactorial.  Small hiatal hernia.  Small ventral hernia containing only fat.  No bowel obstruction. No abscess. Appendix not seen. Uterus absent.  Nonobstructing 4 x 3 mm calculus right kidney. 3 x 2 mm calcification left kidney which may represent a focal vascular calcification but could represent a small nonobstructing  calculus. No hydronephrosis on either side. No ureteral calculi apparent.   Electronically Signed   By: Lowella Grip III M.D.   On: 05/10/2014 14:30   Dg Chest Port 1 View  05/11/2014   CLINICAL DATA:  Two days of fever with vomiting and diarrhea, history of COPD hypertension and pneumonia  EXAM: PORTABLE CHEST - 1 VIEW  COMPARISON:  PA and lateral chest x-ray dated Aug 18, 2011  FINDINGS: The lungs are adequately inflated. There is no focal infiltrate. There is no pleural effusion or pneumothorax. The heart and pulmonary vascularity are normal. The mediastinum is normal in width. The bony thorax exhibits no acute abnormality.  IMPRESSION: There is no active cardiopulmonary disease.   Electronically Signed   By: David  Martinique   On: 05/11/2014 08:03    Micro Results   Recent Results (from the past 240 hour(s))  Urine culture     Status: None (Preliminary result)   Collection Time: 05/10/14  2:51 PM  Result Value Ref Range Status   Specimen Description URINE, CLEAN CATCH  Final   Special Requests NONE  Final   Colony Count   Final    >=100,000 COLONIES/ML Performed at [facility] Performed at [facility]    Report Status PENDING  Incomplete       Today   Subjective:   Joanne Holmes today has no headache,no chest abdominal pain,no new weakness tingling or numbness, feels much better wants to go home today.   Objective:   Blood pressure 128/48, pulse 92, temperature 98.7 F (37.1 C), temperature source Oral, resp. rate 18, SpO2 97 %.   Intake/Output Summary (Last 24 hours) at 05/12/14 1136 Last data filed at 05/12/14 0517  Gross per 24 hour  Intake    720 ml  Output    800 ml  Net    -80 ml    Exam Awake Alert, Oriented x 3, No new F.N deficits, Normal affect [place].AT,PERRAL Supple Neck,No JVD, No cervical lymphadenopathy appriciated.  Symmetrical Chest wall movement, Good air movement bilaterally,  CTAB RRR,No Gallops,Rubs or new Murmurs, No Parasternal Heave +ve B.Sounds, Abd Soft, Non tender, No organomegaly appriciated, No rebound -guarding or rigidity. No Cyanosis, Clubbing or edema, No new Rash  or bruise  Data Review   CBC w Diff: Lab Results  Component Value Date   WBC 7.2 05/12/2014   WBC 12.9* 08/18/2011   HGB 10.7* 05/12/2014   HGB 13.8 08/18/2011   HCT 31.2* 05/12/2014   HCT 41.8 08/18/2011   PLT 307 05/12/2014   LYMPHOPCT 5* 05/10/2014   MONOPCT 12 05/10/2014   EOSPCT 0 05/10/2014   BASOPCT 0 05/10/2014    CMP: Lab Results  Component Value Date   NA 133* 05/12/2014   K 3.8 05/12/2014   CL 104 05/12/2014   CO2 17* 05/12/2014   BUN 21 05/12/2014   CREATININE 1.14* 05/12/2014   CREATININE 0.98 08/16/2011   PROT 6.0 05/12/2014   ALBUMIN 2.5* 05/12/2014   BILITOT 0.6 05/12/2014   ALKPHOS 122* 05/12/2014   AST 38* 05/12/2014   ALT 35 05/12/2014  .   Total Time in preparing paper work, data evaluation and todays exam - 35 minutes  Joanne Holmes M.D on 05/12/2014 at 11:36 AM  [facility] Office  [phone]

## 2014-05-13 LAB — URINE CULTURE: Colony Count: 2000

## 2014-05-14 ENCOUNTER — Ambulatory Visit: Payer: Managed Care, Other (non HMO) | Admitting: Podiatry

## 2014-05-21 ENCOUNTER — Ambulatory Visit (INDEPENDENT_AMBULATORY_CARE_PROVIDER_SITE_OTHER): Payer: Managed Care, Other (non HMO) | Admitting: Podiatry

## 2014-05-21 ENCOUNTER — Encounter: Payer: Self-pay | Admitting: Podiatry

## 2014-05-21 VITALS — BP 155/70 | HR 68 | Resp 12

## 2014-05-21 DIAGNOSIS — M7661 Achilles tendinitis, right leg: Secondary | ICD-10-CM

## 2014-05-22 NOTE — Progress Notes (Signed)
Subjective:     Patient ID: Joanne Holmes, female   DOB: 1945/12/19, 69 y.o.   MRN: [id number]  HPI patient states that the right foot is doing better but she can get occasional discomfort if she's on it too much   Review of Systems     Objective:   Physical Exam Her vascular status intact with minimal discomfort in the Achilles tendon insertion right with minimal inflammation and fluid noted    Assessment:     Doing well with Achilles tendinitis right    Plan:     Advised on physical therapy gradual return soft shoe gear and gradual increase in activity levels. Reappoint if symptoms recur

## 2014-09-21 ENCOUNTER — Other Ambulatory Visit: Payer: Self-pay

## 2015-02-02 ENCOUNTER — Other Ambulatory Visit: Payer: Self-pay

## 2015-02-02 DIAGNOSIS — Z1231 Encounter for screening mammogram for malignant neoplasm of breast: Secondary | ICD-10-CM

## 2015-03-05 ENCOUNTER — Ambulatory Visit
Admission: RE | Admit: 2015-03-05 | Discharge: 2015-03-05 | Disposition: A | Discharge status: Home / Self Care | Payer: Medicare HMO | Source: Ambulatory Visit

## 2015-03-05 DIAGNOSIS — Z1231 Encounter for screening mammogram for malignant neoplasm of breast: Secondary | ICD-10-CM

## 2016-02-09 ENCOUNTER — Other Ambulatory Visit: Payer: Self-pay | Admitting: Family Medicine

## 2016-02-09 DIAGNOSIS — Z1231 Encounter for screening mammogram for malignant neoplasm of breast: Secondary | ICD-10-CM

## 2016-02-10 ENCOUNTER — Other Ambulatory Visit: Payer: Self-pay | Admitting: Neurosurgery

## 2016-02-10 DIAGNOSIS — M48062 Spinal stenosis, lumbar region with neurogenic claudication: Secondary | ICD-10-CM

## 2016-03-01 ENCOUNTER — Ambulatory Visit
Admission: RE | Admit: 2016-03-01 | Discharge: 2016-03-01 | Disposition: A | Discharge status: Home / Self Care | Payer: Medicare HMO | Source: Ambulatory Visit | Attending: Neurosurgery | Admitting: Neurosurgery

## 2016-03-01 DIAGNOSIS — M48062 Spinal stenosis, lumbar region with neurogenic claudication: Secondary | ICD-10-CM

## 2016-03-17 ENCOUNTER — Ambulatory Visit
Admission: RE | Admit: 2016-03-17 | Discharge: 2016-03-17 | Disposition: A | Discharge status: Home / Self Care | Payer: Medicare HMO | Source: Ambulatory Visit | Attending: Family Medicine | Admitting: Family Medicine

## 2016-03-17 DIAGNOSIS — Z1231 Encounter for screening mammogram for malignant neoplasm of breast: Secondary | ICD-10-CM

## 2016-03-29 ENCOUNTER — Encounter (INDEPENDENT_AMBULATORY_CARE_PROVIDER_SITE_OTHER): Payer: Self-pay | Admitting: Physical Medicine and Rehabilitation

## 2016-03-29 ENCOUNTER — Ambulatory Visit (INDEPENDENT_AMBULATORY_CARE_PROVIDER_SITE_OTHER): Payer: Medicare HMO | Admitting: Physical Medicine and Rehabilitation

## 2016-03-29 VITALS — BP 190/87 | HR 82

## 2016-03-29 DIAGNOSIS — M4726 Other spondylosis with radiculopathy, lumbar region: Secondary | ICD-10-CM

## 2016-03-29 DIAGNOSIS — M48062 Spinal stenosis, lumbar region with neurogenic claudication: Secondary | ICD-10-CM

## 2016-03-29 DIAGNOSIS — M5416 Radiculopathy, lumbar region: Secondary | ICD-10-CM

## 2016-03-29 NOTE — Progress Notes (Signed)
Joanne Holmes - 71 y.o. female MRN [id number]  Date of birth: [birth year]  Office Visit Note: Visit Date: 03/29/2016 PCP: [name], PA-C Referred by: Belva Bertin, [place], *  Subjective: Chief Complaint  Patient presents with  . Lower Back - Pain   HPI: Joanne Holmes is a very pleasant 71 year old female that I have actually met before by treating her husband. She comes in today with years of low back pain radiating into the hips somewhat posterior and anterior. She relates that a lot of her pain is with standing and ambulating she is better at rest. She does get some pain with lying flat. She's had no specific trauma but has had prior laminectomy for disc problem at L5-S1. She has had an MRI performed this past December which is reviewed below and reviewed with the patient. She denies any specific numbness tingling or paresthesias. No focal weakness. She has used some medications in the past including anti-inflammatories and pain relievers without much relief. She has had injections in the past by Dr. Jovita Gamma. It appears that talking with her these injections may have been an interlaminar injection at L2-3 above the level of stenosis seen on MRI. She tells me that the injections used to help for several months in the last several have not helped for very long. She also tells me that Dr. Sherwood Gambler told her that in all likelihood she needs a 2 level surgery with fusion. He did mention to her evidently that he might try different position rather than the L2-3 position. She says she's had many injections with him. She still tries to stay active and continues to ambulate and walk when she can. She does find it difficult. She rates her pain as at least a 5-6 out of 10 with any length of time standing. She does take hydrocodone at times. She has not had medication such as duloxetine or gabapentin. She has had physical therapy. Again states active.     Review of Systems    Constitutional: Negative for chills, fever, malaise/fatigue and weight loss.  HENT: Negative for hearing loss and sinus pain.   Eyes: Negative for blurred vision, double vision and photophobia.  Respiratory: Negative for cough and shortness of breath.   Cardiovascular: Negative for chest pain, palpitations and leg swelling.  Gastrointestinal: Negative for abdominal pain, nausea and vomiting.  Genitourinary: Negative for flank pain.  Musculoskeletal: Positive for back pain and joint pain. Negative for myalgias.  Skin: Negative for itching and rash.  Neurological: Negative for tremors, focal weakness and weakness.  Endo/Heme/Allergies: Negative.   Psychiatric/Behavioral: Negative for depression.  All other systems reviewed and are negative.  Otherwise per HPI.  Assessment & Plan: Visit Diagnoses:  1. Lumbar radiculopathy   2. Spinal stenosis of lumbar region with neurogenic claudication   3. Other spondylosis with radiculopathy, lumbar region     Plan: Findings:  Chronic worsening severe axial low back pain with some referral into the hips bilaterally but not really down the legs and no paresthesias. She has fairly new MRI showing multifactorial stenosis at L3-for which has progressed as well as at L4-5. The L3 Devashwar region is fairly severe in the L4-5 is severe. She does have a listhesis of L4 on L5. She has prior lumbar surgery at L5-S1 with degenerative disc height loss and some foraminal narrowing and scar tissue around the left S1 nerve root. Her symptoms are more consistent with a combination of neurogenic claudication from the stenosis and facet  mediated low back pain. Prior injections appear to be an intralaminar injections above the area of stenosis. I am going to try to get notes from Dr. Sherwood Gambler to see exactly what he injected. Depending on those notes I would like to try transforaminal approach either L3 or L4 1 stone her symptoms. Alternatively we could look at facet joint  blocks and see if a lot of her pain is facet mediated pain sedative from the stenosis itself. She might be a candidate for radiofrequency ablation she is not having any radicular pattern. We'll go ahead and schedule her for bilateral transforaminal injection. I'll get the notes from Dr. Sherwood Gambler will go according to that. I would not change any medications at this point although that something to look at a future.    Meds & Orders: No orders of the defined types were placed in this encounter.  No orders of the defined types were placed in this encounter.   Follow-up: Return for Schedule bilateral L3 or L4 transforaminal epidural steroid injection..   Procedures: No procedures performed  No notes on file   Clinical History: Lumbar spine MRI dated 03/01/2016  Alignment: 4 mm anterolisthesis L3-4 with mild progression since 2013. 6.6 mm anterolisthesis L4-5 unchanged. Mild retrolisthesis L1-2.  Vertebrae:  Negative for fracture or mass.  Normal bone marrow  Conus medullaris: Extends to the L1-2 level and appears normal.  Paraspinal and other soft tissues: No retroperitoneal mass or adenopathy. Paraspinous muscles are symmetric with mild atrophy.  Disc levels:  L1-2: Mild retrolisthesis. Disc bulging and mild facet degeneration without significant stenosis.  L2-3:  Mild disc and facet degeneration.  Mild spinal stenosis.  L3-4: Severe spinal stenosis which has progressed in the interval. There is anterior listhesis of L3 on L4. Diffuse disc bulging. Severe facet degeneration has progressed in the interval. There is marked subarticular stenosis bilaterally as well as moderate foraminal encroachment bilaterally.  L4-5: Severe spinal stenosis has progressed in the interval. Severe facet degeneration. Grade 1 anterior listhesis unchanged. Subarticular and foraminal encroachment bilaterally.  L5-S1: Severe disc degeneration with disc space narrowing and endplate spurring.  Prior laminectomy on the left. Diffuse endplate scarring is present. There is soft tissue surrounding the left S1 nerve root which is unchanged most likely due to scar tissue. No recurrent disc protrusion. Mild foraminal narrowing bilaterally due to spurring.  She reports that she quit smoking about 35 years ago. Her smoking use included Cigarettes. She has a 40.00 pack-year smoking history. She has never used smokeless tobacco. No results for input(s): HGBA1C, LABURIC in the last 8760 hours.  Objective:  VS:  HT:    WT:   BMI:     BP:(!) 190/87  HR:82bpm  TEMP: ( )  RESP:  Physical Exam  Constitutional: She is oriented to person, place, and time. She appears well-developed and well-nourished. No distress.  HENT:  Head: Normocephalic and atraumatic.  Nose: Nose normal.  Mouth/Throat: Oropharynx is clear and moist.  Eyes: Conjunctivae are normal. Pupils are equal, round, and reactive to light.  Neck: Normal range of motion. Neck supple.  Cardiovascular: Normal rate, regular rhythm and intact distal pulses.   Pulmonary/Chest: Effort normal and breath sounds normal.  Abdominal: She exhibits no distension. There is no tenderness.  Musculoskeletal:  The patient ambulates without aid but she does have a forward flexed spine. She has no pain with hip rotation. She has good distal strength without deficits. She has 2+ muscle stretch reflexes at the quadriceps and gastrocnemius with  no clonus bilaterally.  Neurological: She is alert and oriented to person, place, and time. She displays normal reflexes. No sensory deficit. She exhibits normal muscle tone. Coordination normal.  Skin: Skin is warm. No rash noted. No erythema.  Psychiatric: She has a normal mood and affect. Her behavior is normal.  Nursing note and vitals reviewed.   Ortho Exam Imaging: No results found.  Past Medical/Family/Surgical/Social History: Medications & Allergies reviewed per EMR Patient Active Problem List    Diagnosis Date Noted  . Essential hypertension 05/11/2014  . Persistent vomiting 03/18/2012  . Hypertensive urgency 03/18/2012  . COPD (chronic obstructive pulmonary disease) ([place]) 03/18/2012   Past Medical History:  Diagnosis Date  . Allergy   . Asthma   . COPD (chronic obstructive pulmonary disease) ([place])   . Hypertension   . Kidney stone   . Neuromuscular disorder ([facility])    carpal tunnel- left  . Pneumonia    History reviewed. No pertinent family history. Past Surgical History:  Procedure Laterality Date  . ABDOMINAL HYSTERECTOMY    . BACK SURGERY  25+ yrs ago   lumbar laminectomy L5-S1  . BLADDER SUSPENSION  ~10 yrs ago  . CARPAL TUNNEL RELEASE  03/08/2012   Procedure: CARPAL TUNNEL RELEASE;  Surgeon: Cammie Sickle., MD;  Location: [facility];  Service: Orthopedics;  Laterality: Left;  . SALPINGOOPHORECTOMY  ~8 yrs ago  . TONSILLECTOMY     Social History   Occupational History  . Not on file.   Social History Main Topics  . Smoking status: Former Smoker    Packs/day: 2.00    Years: 20.00    Types: Cigarettes    Quit date: 03/06/1981  . Smokeless tobacco: Never Used  . Alcohol use No  . Drug use: No  . Sexual activity: Not Currently

## 2016-03-30 ENCOUNTER — Encounter (INDEPENDENT_AMBULATORY_CARE_PROVIDER_SITE_OTHER): Payer: Self-pay | Admitting: Physical Medicine and Rehabilitation

## 2016-04-05 ENCOUNTER — Ambulatory Visit (INDEPENDENT_AMBULATORY_CARE_PROVIDER_SITE_OTHER): Payer: Medicare HMO | Admitting: Physical Medicine and Rehabilitation

## 2016-04-05 ENCOUNTER — Encounter (INDEPENDENT_AMBULATORY_CARE_PROVIDER_SITE_OTHER): Payer: Self-pay | Admitting: Physical Medicine and Rehabilitation

## 2016-04-05 VITALS — BP 186/88 | HR 86 | Temp 98.5°F

## 2016-04-05 DIAGNOSIS — M5416 Radiculopathy, lumbar region: Secondary | ICD-10-CM | POA: Diagnosis not present

## 2016-04-05 MED ORDER — METHYLPREDNISOLONE ACETATE 80 MG/ML IJ SUSP
80.0000 mg | Freq: Once | INTRAMUSCULAR | Status: AC
  Administered 2016-04-05: 80 mg

## 2016-04-05 MED ORDER — LIDOCAINE HCL (PF) 1 % IJ SOLN: 0.3300 mL | Freq: Once | INTRAMUSCULAR | Status: DC

## 2016-04-05 NOTE — Procedures (Signed)
Lumbosacral Transforaminal Epidural Steroid Injection - Infraneural Approach with Fluoroscopic Guidance  Patient: ADAIRA CENTOLA      Date of Birth: 18-Dec-1945 MRN: [id number] PCP: [name], PA-C      Visit Date: 04/05/2016   Universal Protocol:    Date/Time: 01/10/181:50 PM  Consent Given By: the patient  Position: PRONE   Additional Comments: Vital signs were monitored before and after the procedure. Patient was prepped and draped in the usual sterile fashion. The correct patient, procedure, and site was verified.   Injection Procedure Details:  Procedure Site One Meds Administered:  Meds ordered this encounter  Medications  . lidocaine (PF) (XYLOCAINE) 1 % injection 0.3 mL  . methylPREDNISolone acetate (DEPO-MEDROL) injection 80 mg      Laterality: Bilateral  Location/Site:  L3-L4  Needle size: 22 G  Needle type: Spinal  Needle Placement: Transforaminal  Findings:  -Contrast Used: 1 mL iohexol 180 mg iodine/mL   -Comments: Excellent flow of contrast along the nerve and into the epidural space.  Procedure Details: After squaring off the end-plates of the desired vertebral level to get a true AP view, the C-arm was obliqued to the painful side so that the superior articulating process is positioned about 1/3 the length of the inferior endplate.  The needle was aimed toward the junction of the superior articular process and the transverse process of the inferior vertebrae. The needle's initial entry is in the lower third of the foramen through Kambin's triangle. The soft tissues overlying this target were infiltrated with 2-3 ml. of 1% Lidocaine without Epinephrine.  The spinal needle was then inserted and advanced toward the target using a "trajectory" view along the fluoroscope beam.  Under AP and lateral visualization, the needle was advanced so it did not puncture dura and did not traverse medially beyond the 6 o'clock position of the pedicle.  Bi-planar projections were used to confirm position. Aspiration was confirmed to be negative for CSF and/or blood. A 1-2 ml. volume of Isovue-250 was injected and flow of contrast was noted at each level. Radiographs were obtained for documentation purposes.   After attaining the desired flow of contrast documented above, a 0.5 to 1.0 ml test dose of 0.25% Marcaine was injected into each respective transforaminal space.  The patient was observed for 90 seconds post injection.  After no sensory deficits were reported, and normal lower extremity motor function was noted,   the above injectate was administered so that equal amounts of the injectate were placed at each foramen (level) into the transforaminal epidural space.   Additional Comments:  The patient tolerated the procedure well Dressing: Band-Aid    Post-procedure details: Patient was observed during the procedure. Post-procedure instructions were reviewed.  Patient left the clinic in stable condition.

## 2016-04-05 NOTE — Patient Instructions (Signed)

## 2016-04-05 NOTE — Progress Notes (Signed)
Joanne Holmes - 71 y.o. female MRN [id number]  Date of birth: 11/20/1945  Office Visit Note: Visit Date: 04/05/2016 PCP: [name], PA-C Referred by: Belva Bertin, [place], *  Subjective: Chief Complaint  Patient presents with  . Lower Back - Pain   HPI: Joanne Holmes is a very pleasant 71 year old female who is here today for planned injection. States she has had no change in symptoms. We did obtain notes from Dr. Daivd Council since her last visit. She did have also poll intralaminar injections at L2-3. Her latest imaging which is reviewed below does show severe stenosis both at L3-for an L4-5 of more progression of the L3-for region. Her symptoms are again pain across the low back radiating into the hips anterior laterally. I think the best approach is bilateral L3 transforaminal epidural steroid injection. We did discuss the differences again in this injection versus the intralaminar injection and I think she'll do well although probably going to have some increased pain sensation during the injection because of the narrowing.    ROS Otherwise per HPI.  Assessment & Plan: Visit Diagnoses:  1. Lumbar radiculopathy     Plan: Findings:  Bilateral L3 transforaminal injections for lumbar radiculopathy and stenosis.    Meds & Orders:  Meds ordered this encounter  Medications  . lidocaine (PF) (XYLOCAINE) 1 % injection 0.3 mL  . methylPREDNISolone acetate (DEPO-MEDROL) injection 80 mg    Orders Placed This Encounter  Procedures  . Epidural Steroid injection    Follow-up: Return in about 3 weeks (around 04/26/2016).   Procedures: No procedures performed  Lumbosacral Transforaminal Epidural Steroid Injection - Infraneural Approach with Fluoroscopic Guidance  Patient: Joanne Holmes      Date of Birth: 1945-11-06 MRN: [id number] PCP: [name], PA-C      Visit Date: 04/05/2016   Universal Protocol:    Date/Time: 01/10/181:50 PM  Consent Given By:  the patient  Position: PRONE   Additional Comments: Vital signs were monitored before and after the procedure. Patient was prepped and draped in the usual sterile fashion. The correct patient, procedure, and site was verified.   Injection Procedure Details:  Procedure Site One Meds Administered:  Meds ordered this encounter  Medications  . lidocaine (PF) (XYLOCAINE) 1 % injection 0.3 mL  . methylPREDNISolone acetate (DEPO-MEDROL) injection 80 mg      Laterality: Bilateral  Location/Site:  L3-L4  Needle size: 22 G  Needle type: Spinal  Needle Placement: Transforaminal  Findings:  -Contrast Used: 1 mL iohexol 180 mg iodine/mL   -Comments: Excellent flow of contrast along the nerve and into the epidural space.  Procedure Details: After squaring off the end-plates of the desired vertebral level to get a true AP view, the C-arm was obliqued to the painful side so that the superior articulating process is positioned about 1/3 the length of the inferior endplate.  The needle was aimed toward the junction of the superior articular process and the transverse process of the inferior vertebrae. The needle's initial entry is in the lower third of the foramen through Kambin's triangle. The soft tissues overlying this target were infiltrated with 2-3 ml. of 1% Lidocaine without Epinephrine.  The spinal needle was then inserted and advanced toward the target using a "trajectory" view along the fluoroscope beam.  Under AP and lateral visualization, the needle was advanced so it did not puncture dura and did not traverse medially beyond the 6 o'clock position of the pedicle. Bi-planar projections were used to  confirm position. Aspiration was confirmed to be negative for CSF and/or blood. A 1-2 ml. volume of Isovue-250 was injected and flow of contrast was noted at each level. Radiographs were obtained for documentation purposes.   After attaining the desired flow of contrast documented above, a  0.5 to 1.0 ml test dose of 0.25% Marcaine was injected into each respective transforaminal space.  The patient was observed for 90 seconds post injection.  After no sensory deficits were reported, and normal lower extremity motor function was noted,   the above injectate was administered so that equal amounts of the injectate were placed at each foramen (level) into the transforaminal epidural space.   Additional Comments:  The patient tolerated the procedure well Dressing: Band-Aid    Post-procedure details: Patient was observed during the procedure. Post-procedure instructions were reviewed.  Patient left the clinic in stable condition.     Clinical History: Lumbar spine MRI dated 03/01/2016  Alignment: 4 mm anterolisthesis L3-4 with mild progression since 2013. 6.6 mm anterolisthesis L4-5 unchanged. Mild retrolisthesis L1-2.  Vertebrae:  Negative for fracture or mass.  Normal bone marrow  Conus medullaris: Extends to the L1-2 level and appears normal.  Paraspinal and other soft tissues: No retroperitoneal mass or adenopathy. Paraspinous muscles are symmetric with mild atrophy.  Disc levels:  L1-2: Mild retrolisthesis. Disc bulging and mild facet degeneration without significant stenosis.  L2-3:  Mild disc and facet degeneration.  Mild spinal stenosis.  L3-4: Severe spinal stenosis which has progressed in the interval. There is anterior listhesis of L3 on L4. Diffuse disc bulging. Severe facet degeneration has progressed in the interval. There is marked subarticular stenosis bilaterally as well as moderate foraminal encroachment bilaterally.  L4-5: Severe spinal stenosis has progressed in the interval. Severe facet degeneration. Grade 1 anterior listhesis unchanged. Subarticular and foraminal encroachment bilaterally.  L5-S1: Severe disc degeneration with disc space narrowing and endplate spurring. Prior laminectomy on the left. Diffuse endplate scarring is  present. There is soft tissue surrounding the left S1 nerve root which is unchanged most likely due to scar tissue. No recurrent disc protrusion. Mild foraminal narrowing bilaterally due to spurring.  She reports that she quit smoking about 35 years ago. Her smoking use included Cigarettes. She has a 40.00 pack-year smoking history. She has never used smokeless tobacco. No results for input(s): HGBA1C, LABURIC in the last 8760 hours.  Objective:  VS:  HT:    WT:   BMI:     BP:(!) 186/88  HR:86bpm  TEMP:98.5 F (36.9 C)(Oral)  RESP:94 % Physical Exam  Musculoskeletal:  The patient relates without aid with a forward flexed spine with good distal strength.    Ortho Exam Imaging: No results found.  Past Medical/Family/Surgical/Social History: Medications & Allergies reviewed per EMR Patient Active Problem List   Diagnosis Date Noted  . Essential hypertension 05/11/2014  . Persistent vomiting 03/18/2012  . Hypertensive urgency 03/18/2012  . COPD (chronic obstructive pulmonary disease) ([place]) 03/18/2012   Past Medical History:  Diagnosis Date  . Allergy   . Asthma   . COPD (chronic obstructive pulmonary disease) ([place])   . Hypertension   . Kidney stone   . Neuromuscular disorder ([facility])    carpal tunnel- left  . Pneumonia    History reviewed. No pertinent family history. Past Surgical History:  Procedure Laterality Date  . ABDOMINAL HYSTERECTOMY    . BACK SURGERY  25+ yrs ago   lumbar laminectomy L5-S1  . BLADDER SUSPENSION  ~10 yrs ago  .  CARPAL TUNNEL RELEASE  03/08/2012   Procedure: CARPAL TUNNEL RELEASE;  Surgeon: Cammie Sickle., MD;  Location: [facility];  Service: Orthopedics;  Laterality: Left;  . SALPINGOOPHORECTOMY  ~8 yrs ago  . TONSILLECTOMY     Social History   Occupational History  . Not on file.   Social History Main Topics  . Smoking status: Former Smoker    Packs/day: 2.00    Years: 20.00    Types: Cigarettes    Quit date:  03/06/1981  . Smokeless tobacco: Never Used  . Alcohol use No  . Drug use: No  . Sexual activity: Not Currently

## 2016-04-26 ENCOUNTER — Ambulatory Visit (INDEPENDENT_AMBULATORY_CARE_PROVIDER_SITE_OTHER): Payer: Medicare HMO | Admitting: Physical Medicine and Rehabilitation

## 2016-04-26 ENCOUNTER — Encounter (INDEPENDENT_AMBULATORY_CARE_PROVIDER_SITE_OTHER): Payer: Self-pay | Admitting: Physical Medicine and Rehabilitation

## 2016-04-26 VITALS — BP 136/59 | HR 86

## 2016-04-26 DIAGNOSIS — M48062 Spinal stenosis, lumbar region with neurogenic claudication: Secondary | ICD-10-CM

## 2016-04-26 DIAGNOSIS — M5416 Radiculopathy, lumbar region: Secondary | ICD-10-CM

## 2016-04-26 DIAGNOSIS — M961 Postlaminectomy syndrome, not elsewhere classified: Secondary | ICD-10-CM | POA: Diagnosis not present

## 2016-04-26 NOTE — Progress Notes (Signed)
Joanne Holmes - 71 y.o. female MRN [id number]  Date of birth: 1946/02/26  Office Visit Note: Visit Date: 04/26/2016 PCP: [name], PA-C Referred by: Belva Bertin, [place], *  Subjective: Chief Complaint  Patient presents with  . 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 She is somewhat complicated in that she has worsening stenosis at L3-for L4-5. She's been a patient of Dr. Sherwood Gambler to his receiving injections over the years from him that seemingly just quit really being very beneficial. Almost all of those injections were L2-3 interlaminar injections above the stenosis. We did get his notes to review since the last time we saw her. We completed bilateral L3 transforaminal epidural steroid injections a few weeks ago. We wanted to see her back since they didn't seem to help very much. She says she got about a day of relief and then the symptoms returned. She does report that the right side is a little bit better than the left. I did review the fluoroscopic images on the right sided injection did appear to be better placed in the left but both were pretty adequate with good flow of contrast. At this point she still having a lot of pain in his low back some radicular type component. It is back to the level that she had before. She does get radicular symptoms in the legs. She's had no focal weakness or new trauma or any other new symptoms. Again she's had full gamut of conservative care with therapy medication trials as well as epidural injections it used to be more successful.    Got relief from injection for about a day. Pain is back up to the level it was at before. Pain in lower back- left side hurts a little worse than the right. Review of Systems  Constitutional: Negative for chills, fever, malaise/fatigue and weight loss.  HENT: Negative for hearing loss  and sinus pain.   Eyes: Negative for blurred vision, double vision and photophobia.  Respiratory: Negative for cough and shortness of breath.   Cardiovascular: Negative for chest pain, palpitations and leg swelling.  Gastrointestinal: Negative for abdominal pain, nausea and vomiting.  Genitourinary: Negative for flank pain.  Musculoskeletal: Positive for back pain. Negative for myalgias.  Skin: Negative for itching and rash.  Neurological: Negative for tremors, focal weakness and weakness.  Endo/Heme/Allergies: Negative.   Psychiatric/Behavioral: Negative for depression.  All other systems reviewed and are negative.  Otherwise per HPI.  Assessment & Plan: Visit Diagnoses: No diagnosis found.  Plan: Findings:  Progressive worsening of lumbar stenosis particularly at L3-for also L4-5. Ultimately level facet arthropathy. Prior laminectomy at L5-S1 by Dr. Sherwood Gambler. Epidural injections in the past that helped her these were always intralaminar injections above the level of stenosis. These are performed by Dr. Sherwood Gambler. She has never had an injection below the level of stenosis such as a L5-S1 interlaminar. Bilateral L3 transforaminal approach didn't offer much benefit in the right side little better placed in the left and she did have some right-sided symptomatic relief more than the other side but not much. I think worse on the right track and I hope we can get her some relief. If we get into a situation where she was given several of relief and would help. I think the first approach would be an intralaminar injection at L5-S1 just to see how she does.  We will follow that up by potential injection from a transforaminal approach at L4. We talked about this at length today and she does want to follow through with this and does not really interested too much in surgery.    Meds & Orders: No orders of the defined types were placed in this encounter.  No orders of the defined types were placed in this  encounter.   Follow-up: Return for L5-S1 interlaminar epidural steroid injection.   Procedures: No procedures performed  No notes on file   Clinical History: Lumbar spine MRI dated 03/01/2016  Alignment: 4 mm anterolisthesis L3-4 with mild progression since 2013. 6.6 mm anterolisthesis L4-5 unchanged. Mild retrolisthesis L1-2.  Vertebrae:  Negative for fracture or mass.  Normal bone marrow  Conus medullaris: Extends to the L1-2 level and appears normal.  Paraspinal and other soft tissues: No retroperitoneal mass or adenopathy. Paraspinous muscles are symmetric with mild atrophy.  Disc levels:  L1-2: Mild retrolisthesis. Disc bulging and mild facet degeneration without significant stenosis.  L2-3:  Mild disc and facet degeneration.  Mild spinal stenosis.  L3-4: Severe spinal stenosis which has progressed in the interval. There is anterior listhesis of L3 on L4. Diffuse disc bulging. Severe facet degeneration has progressed in the interval. There is marked subarticular stenosis bilaterally as well as moderate foraminal encroachment bilaterally.  L4-5: Severe spinal stenosis has progressed in the interval. Severe facet degeneration. Grade 1 anterior listhesis unchanged. Subarticular and foraminal encroachment bilaterally.  L5-S1: Severe disc degeneration with disc space narrowing and endplate spurring. Prior laminectomy on the left. Diffuse endplate scarring is present. There is soft tissue surrounding the left S1 nerve root which is unchanged most likely due to scar tissue. No recurrent disc protrusion. Mild foraminal narrowing bilaterally due to spurring.  She reports that she quit smoking about 35 years ago. Her smoking use included Cigarettes. She has a 40.00 pack-year smoking history. She has never used smokeless tobacco. No results for input(s): HGBA1C, LABURIC in the last 8760 hours.  Objective:  VS:  HT:    WT:   BMI:     BP:(!) 136/59  HR:86bpm   TEMP: ( )  RESP:  Physical Exam  Constitutional: She is oriented to person, place, and time. She appears well-developed and well-nourished.  Eyes: Conjunctivae and EOM are normal. Pupils are equal, round, and reactive to light.  Neck: Normal range of motion. Neck supple.  Cardiovascular: Normal rate and intact distal pulses.   Pulmonary/Chest: Effort normal.  Musculoskeletal:  Patient ambulates without aid. She is somewhat slow to rise from a seated position. She relates with forward flexed spine. Pain with extension rotation. Good distal strength.  Neurological: She is alert and oriented to person, place, and time. She exhibits normal muscle tone. Coordination normal.  Skin: Skin is warm and dry. No rash noted. No erythema.  Psychiatric: She has a normal mood and affect. Her behavior is normal.  Nursing note and vitals reviewed.   Ortho Exam Imaging: No results found.  Past Medical/Family/Surgical/Social History: Medications & Allergies reviewed per EMR Patient Active Problem List   Diagnosis Date Noted  . Essential hypertension 05/11/2014  . Persistent vomiting 03/18/2012  . Hypertensive urgency 03/18/2012  . COPD (chronic obstructive pulmonary disease) ([place]) 03/18/2012   Past Medical History:  Diagnosis Date  . Allergy   . Asthma   . COPD (chronic obstructive pulmonary disease) ([place])   . Hypertension   . Kidney stone   . Neuromuscular disorder ([place])  carpal tunnel- left  . Pneumonia    History reviewed. No pertinent family history. Past Surgical History:  Procedure Laterality Date  . ABDOMINAL HYSTERECTOMY    . BACK SURGERY  25+ yrs ago   lumbar laminectomy L5-S1  . BLADDER SUSPENSION  ~10 yrs ago  . CARPAL TUNNEL RELEASE  03/08/2012   Procedure: CARPAL TUNNEL RELEASE;  Surgeon: Cammie Sickle., MD;  Location: [facility];  Service: Orthopedics;  Laterality: Left;  . SALPINGOOPHORECTOMY  ~8 yrs ago  . TONSILLECTOMY     Social History    Occupational History  . Not on file.   Social History Main Topics  . Smoking status: Former Smoker    Packs/day: 2.00    Years: 20.00    Types: Cigarettes    Quit date: 03/06/1981  . Smokeless tobacco: Never Used  . Alcohol use No  . Drug use: No  . Sexual activity: Not Currently

## 2016-04-27 ENCOUNTER — Encounter (INDEPENDENT_AMBULATORY_CARE_PROVIDER_SITE_OTHER): Payer: Self-pay | Admitting: Physical Medicine and Rehabilitation

## 2016-04-27 DIAGNOSIS — M48062 Spinal stenosis, lumbar region with neurogenic claudication: Secondary | ICD-10-CM | POA: Insufficient documentation

## 2016-04-27 DIAGNOSIS — M961 Postlaminectomy syndrome, not elsewhere classified: Secondary | ICD-10-CM | POA: Insufficient documentation

## 2016-05-17 ENCOUNTER — Encounter (INDEPENDENT_AMBULATORY_CARE_PROVIDER_SITE_OTHER): Payer: Medicare HMO | Admitting: Physical Medicine and Rehabilitation

## 2016-05-29 ENCOUNTER — Ambulatory Visit (INDEPENDENT_AMBULATORY_CARE_PROVIDER_SITE_OTHER): Payer: Medicare HMO

## 2016-05-29 ENCOUNTER — Ambulatory Visit (INDEPENDENT_AMBULATORY_CARE_PROVIDER_SITE_OTHER): Payer: Medicare HMO | Admitting: Physical Medicine and Rehabilitation

## 2016-05-29 ENCOUNTER — Encounter (INDEPENDENT_AMBULATORY_CARE_PROVIDER_SITE_OTHER): Payer: Self-pay | Admitting: Physical Medicine and Rehabilitation

## 2016-05-29 VITALS — BP 193/87 | HR 83 | Temp 98.7°F

## 2016-05-29 DIAGNOSIS — M5416 Radiculopathy, lumbar region: Secondary | ICD-10-CM | POA: Diagnosis not present

## 2016-05-29 MED ORDER — LIDOCAINE HCL (PF) 1 % IJ SOLN
0.3300 mL | Freq: Once | INTRAMUSCULAR | Status: AC
Start: 2016-05-29 — End: 2016-05-29
  Administered 2016-05-29: 0.3 mL

## 2016-05-29 MED ORDER — METHYLPREDNISOLONE ACETATE 80 MG/ML IJ SUSP
80.0000 mg | Freq: Once | INTRAMUSCULAR | Status: AC
  Administered 2016-05-29: 80 mg

## 2016-05-29 NOTE — Procedures (Signed)
Lumbar Epidural Steroid Injection - Interlaminar Approach with Fluoroscopic Guidance  Patient: Joanne Holmes      Date of Birth: 06/12/1945 MRN: [id number] PCP: [name], PA-C      Visit Date: 05/29/2016   Universal Protocol:    Date/Time: 03/05/188:50 AM  Consent Given By: the patient  Position: PRONE  Additional Comments: Vital signs were monitored before and after the procedure. Patient was prepped and draped in the usual sterile fashion. The correct patient, procedure, and site was verified.   Injection Procedure Details:  Procedure Site One Meds Administered:  Meds ordered this encounter  Medications  . lidocaine (PF) (XYLOCAINE) 1 % injection 0.3 mL  . methylPREDNISolone acetate (DEPO-MEDROL) injection 80 mg     Laterality: Left  Location/Site:  L5-S1  Needle size: 20 G  Needle type: Tuohy  Needle Placement: Paramedian epidural  Findings:  -Contrast Used: 1 mL iohexol 180 mg iodine/mL   -Comments: Excellent flow of contrast into the epidural space.  Procedure Details: Using a paramedian approach from the side mentioned above, the region overlying the inferior lamina was localized under fluoroscopic visualization and the soft tissues overlying this structure were infiltrated with 4 ml. of 1% Lidocaine without Epinephrine. The Tuohy needle was inserted into the epidural space using a paramedian approach.   The epidural space was localized using loss of resistance along with lateral and bi-planar fluoroscopic views.  After negative aspirate for air, blood, and CSF, a 2 ml. volume of Isovue-250 was injected into the epidural space and the flow of contrast was observed. Radiographs were obtained for documentation purposes.    The injectate was administered into the level noted above.   Additional Comments:  The patient tolerated the procedure well Dressing: Band-Aid    Post-procedure details: Patient was observed during the  procedure. Post-procedure instructions were reviewed.  Patient left the clinic in stable condition.

## 2016-05-29 NOTE — Progress Notes (Signed)
Joanne Holmes - 71 y.o. female MRN [id number]  Date of birth: 09/23/1945  Office Visit Note: Visit Date: 05/29/2016 PCP: [name], PA-C Referred by: [place], [place], *  Subjective: No chief complaint on file.  HPI: Joanne Holmes is a very pleasant 71 year old female with known severe spinal stenosis despite prior lumbar surgery. She has multilevel issues and is gotten worse over the years. Transforaminal approach didn't give her much relief without try intralaminar approach below the surgery. Hopefully she'll do well with that. She does have a laminectomy at L5-S1 on the left but I think we can actually get this through the ligamentum flavum hopefully she'll do okay.    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 Planned injection- no changes since last seen. ROS Otherwise per HPI.  Assessment & Plan: Visit Diagnoses:  1. Lumbar radiculopathy     Plan: No additional findings.   Meds & Orders:  Meds ordered this encounter  Medications  . lidocaine (PF) (XYLOCAINE) 1 % injection 0.3 mL  . methylPREDNISolone acetate (DEPO-MEDROL) injection 80 mg    Orders Placed This Encounter  Procedures  . XR C-ARM NO REPORT  . Epidural Steroid injection    Follow-up: Return if symptoms worsen or fail to improve, 2 weeks.   Procedures: No procedures performed  Lumbar Epidural Steroid Injection - Interlaminar Approach with Fluoroscopic Guidance  Patient: Joanne Holmes      Date of Birth: 24-Sep-1945 MRN: [id number] PCP: [name], PA-C      Visit Date: 05/29/2016   Universal Protocol:    Date/Time: 03/05/188:50 AM  Consent Given By: the patient  Position: PRONE  Additional Comments: Vital signs were monitored before and after the procedure. Patient was prepped and draped in the usual sterile fashion. The correct patient, procedure, and site was verified.   Injection Procedure Details:  Procedure Site One Meds Administered:    Meds ordered this encounter  Medications  . lidocaine (PF) (XYLOCAINE) 1 % injection 0.3 mL  . methylPREDNISolone acetate (DEPO-MEDROL) injection 80 mg     Laterality: Left  Location/Site:  L5-S1  Needle size: 20 G  Needle type: Tuohy  Needle Placement: Paramedian epidural  Findings:  -Contrast Used: 1 mL iohexol 180 mg iodine/mL   -Comments: Excellent flow of contrast into the epidural space.  Procedure Details: Using a paramedian approach from the side mentioned above, the region overlying the inferior lamina was localized under fluoroscopic visualization and the soft tissues overlying this structure were infiltrated with 4 ml. of 1% Lidocaine without Epinephrine. The Tuohy needle was inserted into the epidural space using a paramedian approach.   The epidural space was localized using loss of resistance along with lateral and bi-planar fluoroscopic views.  After negative aspirate for air, blood, and CSF, a 2 ml. volume of Isovue-250 was injected into the epidural space and the flow of contrast was observed. Radiographs were obtained for documentation purposes.    The injectate was administered into the level noted above.   Additional Comments:  The patient tolerated the procedure well Dressing: Band-Aid    Post-procedure details: Patient was observed during the procedure. Post-procedure instructions were reviewed.  Patient left the clinic in stable condition.    Clinical History: Lumbar spine MRI dated 03/01/2016  Alignment: 4 mm anterolisthesis L3-4 with mild progression since 2013. 6.6 mm anterolisthesis L4-5 unchanged. Mild retrolisthesis L1-2.  Vertebrae:  Negative for fracture or mass.  Normal bone marrow  Conus medullaris: Extends to the  L1-2 level and appears normal.  Paraspinal and other soft tissues: No retroperitoneal mass or adenopathy. Paraspinous muscles are symmetric with mild atrophy.  Disc levels:  L1-2: Mild retrolisthesis. Disc  bulging and mild facet degeneration without significant stenosis.  L2-3:  Mild disc and facet degeneration.  Mild spinal stenosis.  L3-4: Severe spinal stenosis which has progressed in the interval. There is anterior listhesis of L3 on L4. Diffuse disc bulging. Severe facet degeneration has progressed in the interval. There is marked subarticular stenosis bilaterally as well as moderate foraminal encroachment bilaterally.  L4-5: Severe spinal stenosis has progressed in the interval. Severe facet degeneration. Grade 1 anterior listhesis unchanged. Subarticular and foraminal encroachment bilaterally.  L5-S1: Severe disc degeneration with disc space narrowing and endplate spurring. Prior laminectomy on the left. Diffuse endplate scarring is present. There is soft tissue surrounding the left S1 nerve root which is unchanged most likely due to scar tissue. No recurrent disc protrusion. Mild foraminal narrowing bilaterally due to spurring.  She reports that she quit smoking about 35 years ago. Her smoking use included Cigarettes. She has a 40.00 pack-year smoking history. She has never used smokeless tobacco. No results for input(s): HGBA1C, LABURIC in the last 8760 hours.  Objective:  VS:  HT:    WT:   BMI:     BP:(!) 193/87  HR:83bpm  TEMP:98.7 F (37.1 C)(Oral)  RESP:95 % Physical Exam  Musculoskeletal:  Patient ambulates without aid with a slightly forward flexed spine. She has good distal strength.    Ortho Exam Imaging: No results found.  Past Medical/Family/Surgical/Social History: Medications & Allergies reviewed per EMR Patient Active Problem List   Diagnosis Date Noted  . Spinal stenosis of lumbar region with neurogenic claudication 04/27/2016  . Post laminectomy syndrome 04/27/2016  . Essential hypertension 05/11/2014  . Persistent vomiting 03/18/2012  . Hypertensive urgency 03/18/2012  . COPD (chronic obstructive pulmonary disease) ([place]) 03/18/2012   Past  Medical History:  Diagnosis Date  . Allergy   . Asthma   . COPD (chronic obstructive pulmonary disease) ([place])   . Hypertension   . Kidney stone   . Neuromuscular disorder (HCC)    carpal tunnel- left  . Pneumonia    History reviewed. No pertinent family history. Past Surgical History:  Procedure Laterality Date  . ABDOMINAL HYSTERECTOMY    . BACK SURGERY  25+ yrs ago   lumbar laminectomy L5-S1  . BLADDER SUSPENSION  ~10 yrs ago  . CARPAL TUNNEL RELEASE  03/08/2012   Procedure: CARPAL TUNNEL RELEASE;  Surgeon: Cammie Sickle., MD;  Location: [facility];  Service: Orthopedics;  Laterality: Left;  . SALPINGOOPHORECTOMY  ~8 yrs ago  . TONSILLECTOMY     Social History   Occupational History  . Not on file.   Social History Main Topics  . Smoking status: Former Smoker    Packs/day: 2.00    Years: 20.00    Types: Cigarettes    Quit date: 03/06/1981  . Smokeless tobacco: Never Used  . Alcohol use No  . Drug use: No  . Sexual activity: Not Currently

## 2016-05-29 NOTE — Patient Instructions (Signed)

## 2016-10-09 ENCOUNTER — Telehealth (INDEPENDENT_AMBULATORY_CARE_PROVIDER_SITE_OTHER): Payer: Self-pay | Admitting: Physical Medicine and Rehabilitation

## 2016-10-09 NOTE — Telephone Encounter (Signed)
Patient needs auth for [id number]. She is scheduled for 7/31 at 1pm and is aware this is pending auth.

## 2016-10-09 NOTE — Telephone Encounter (Signed)
yes

## 2016-10-10 NOTE — Telephone Encounter (Signed)
Auto approved on [employer]. [id number]#[id number].

## 2016-10-24 ENCOUNTER — Ambulatory Visit (INDEPENDENT_AMBULATORY_CARE_PROVIDER_SITE_OTHER): Payer: Medicare HMO

## 2016-10-24 ENCOUNTER — Encounter (INDEPENDENT_AMBULATORY_CARE_PROVIDER_SITE_OTHER): Payer: Self-pay | Admitting: Physical Medicine and Rehabilitation

## 2016-10-24 ENCOUNTER — Ambulatory Visit (INDEPENDENT_AMBULATORY_CARE_PROVIDER_SITE_OTHER): Payer: Medicare HMO | Admitting: Physical Medicine and Rehabilitation

## 2016-10-24 VITALS — BP 158/76 | HR 77

## 2016-10-24 DIAGNOSIS — M48062 Spinal stenosis, lumbar region with neurogenic claudication: Secondary | ICD-10-CM | POA: Diagnosis not present

## 2016-10-24 DIAGNOSIS — M961 Postlaminectomy syndrome, not elsewhere classified: Secondary | ICD-10-CM

## 2016-10-24 DIAGNOSIS — M5416 Radiculopathy, lumbar region: Secondary | ICD-10-CM

## 2016-10-24 MED ORDER — LIDOCAINE HCL (PF) 1 % IJ SOLN
2.0000 mL | Freq: Once | INTRAMUSCULAR | Status: AC
  Administered 2016-10-24: 2 mL

## 2016-10-24 MED ORDER — METHYLPREDNISOLONE ACETATE 80 MG/ML IJ SUSP
80.0000 mg | Freq: Once | INTRAMUSCULAR | Status: AC
  Administered 2016-10-24: 80 mg

## 2016-10-24 NOTE — Progress Notes (Unsigned)
Fluoro Time:15 sec mGy: 16.31

## 2016-10-24 NOTE — Progress Notes (Deleted)
Patient states she did well with the last injection for 2 months. Gradual increase in pain since. Left side low back pain with walking and standing, No pain with sitting. Denies leg pain.

## 2016-10-24 NOTE — Patient Instructions (Signed)

## 2016-10-25 NOTE — Procedures (Signed)
[name] is a 71 year old female with prior lumbar surgery and history of lumbar stenosis which is quite significant. We completed intralaminar epidural steroid injection at L5-S1 in March and she's been very pleased with the results. She had 2 months of really good relief and then gradual increase in symptoms since that time. Overall is been over 4 months since we did the injection. She's had no new trauma no other new symptoms just return of the same radicular complaints. We will repeat the injection today due to the fact that she did get good relief. This would be diagnostic and therapeutic.  Lumbar Epidural Steroid Injection - Interlaminar Approach with Fluoroscopic Guidance  Patient: Joanne Holmes      Date of Birth: 07/10/1945 MRN: [id number] PCP: [name], PA-C      Visit Date: 10/24/2016   Universal Protocol:    Date/Time: 08/01/186:17 AM  Consent Given By: the patient  Position: PRONE  Additional Comments: Vital signs were monitored before and after the procedure. Patient was prepped and draped in the usual sterile fashion. The correct patient, procedure, and site was verified.   Injection Procedure Details:  Procedure Site One Meds Administered:  Meds ordered this encounter  Medications  . lidocaine (PF) (XYLOCAINE) 1 % injection 2 mL  . methylPREDNISolone acetate (DEPO-MEDROL) injection 80 mg     Laterality: Left  Location/Site:  L5-S1  Needle size: 20 G  Needle type: Tuohy  Needle Placement: Paramedian epidural  Findings:  -Contrast Used: 1 mL iohexol 180 mg iodine/mL   -Comments: Excellent flow of contrast into the epidural space.  Procedure Details: Using a paramedian approach from the side mentioned above, the region overlying the inferior lamina was localized under fluoroscopic visualization and the soft tissues overlying this structure were infiltrated with 4 ml. of 1% Lidocaine without Epinephrine. The Tuohy needle was inserted  into the epidural space using a paramedian approach.   The epidural space was localized using loss of resistance along with lateral and bi-planar fluoroscopic views.  After negative aspirate for air, blood, and CSF, a 2 ml. volume of Isovue-250 was injected into the epidural space and the flow of contrast was observed. Radiographs were obtained for documentation purposes.    The injectate was administered into the level noted above.   Additional Comments:  The patient tolerated the procedure well Dressing: Band-Aid    Post-procedure details: Patient was observed during the procedure. Post-procedure instructions were reviewed.  Patient left the clinic in stable condition.

## 2016-12-21 ENCOUNTER — Observation Stay (HOSPITAL_COMMUNITY)
Admission: EM | Admit: 2016-12-21 | Discharge: 2016-12-23 | Disposition: A | Discharge status: Home / Self Care | Payer: Medicare HMO | Attending: Internal Medicine | Admitting: Internal Medicine

## 2016-12-21 ENCOUNTER — Encounter (HOSPITAL_COMMUNITY): Payer: Self-pay

## 2016-12-21 ENCOUNTER — Emergency Department (HOSPITAL_COMMUNITY): Payer: Medicare HMO

## 2016-12-21 DIAGNOSIS — I5033 Acute on chronic diastolic (congestive) heart failure: Secondary | ICD-10-CM | POA: Diagnosis not present

## 2016-12-21 DIAGNOSIS — D649 Anemia, unspecified: Secondary | ICD-10-CM | POA: Insufficient documentation

## 2016-12-21 DIAGNOSIS — K449 Diaphragmatic hernia without obstruction or gangrene: Secondary | ICD-10-CM | POA: Diagnosis not present

## 2016-12-21 DIAGNOSIS — I2510 Atherosclerotic heart disease of native coronary artery without angina pectoris: Secondary | ICD-10-CM | POA: Insufficient documentation

## 2016-12-21 DIAGNOSIS — E669 Obesity, unspecified: Secondary | ICD-10-CM | POA: Diagnosis not present

## 2016-12-21 DIAGNOSIS — R0609 Other forms of dyspnea: Secondary | ICD-10-CM | POA: Diagnosis present

## 2016-12-21 DIAGNOSIS — Z90710 Acquired absence of both cervix and uterus: Secondary | ICD-10-CM | POA: Insufficient documentation

## 2016-12-21 DIAGNOSIS — I700 Atherosclerosis of aorta: Secondary | ICD-10-CM | POA: Insufficient documentation

## 2016-12-21 DIAGNOSIS — Z881 Allergy status to other antibiotic agents status: Secondary | ICD-10-CM | POA: Diagnosis not present

## 2016-12-21 DIAGNOSIS — R0600 Dyspnea, unspecified: Secondary | ICD-10-CM | POA: Insufficient documentation

## 2016-12-21 DIAGNOSIS — I110 Hypertensive heart disease with heart failure: Secondary | ICD-10-CM | POA: Diagnosis not present

## 2016-12-21 DIAGNOSIS — I272 Pulmonary hypertension, unspecified: Secondary | ICD-10-CM | POA: Diagnosis not present

## 2016-12-21 DIAGNOSIS — Z808 Family history of malignant neoplasm of other organs or systems: Secondary | ICD-10-CM | POA: Insufficient documentation

## 2016-12-21 DIAGNOSIS — Z880 Allergy status to penicillin: Secondary | ICD-10-CM | POA: Insufficient documentation

## 2016-12-21 DIAGNOSIS — Z87442 Personal history of urinary calculi: Secondary | ICD-10-CM | POA: Insufficient documentation

## 2016-12-21 DIAGNOSIS — E785 Hyperlipidemia, unspecified: Secondary | ICD-10-CM | POA: Diagnosis not present

## 2016-12-21 DIAGNOSIS — Z8249 Family history of ischemic heart disease and other diseases of the circulatory system: Secondary | ICD-10-CM | POA: Insufficient documentation

## 2016-12-21 DIAGNOSIS — Z888 Allergy status to other drugs, medicaments and biological substances status: Secondary | ICD-10-CM | POA: Insufficient documentation

## 2016-12-21 DIAGNOSIS — N179 Acute kidney failure, unspecified: Secondary | ICD-10-CM | POA: Insufficient documentation

## 2016-12-21 DIAGNOSIS — M961 Postlaminectomy syndrome, not elsewhere classified: Secondary | ICD-10-CM | POA: Diagnosis not present

## 2016-12-21 DIAGNOSIS — Z79899 Other long term (current) drug therapy: Secondary | ICD-10-CM | POA: Insufficient documentation

## 2016-12-21 DIAGNOSIS — Z87891 Personal history of nicotine dependence: Secondary | ICD-10-CM | POA: Insufficient documentation

## 2016-12-21 DIAGNOSIS — R0902 Hypoxemia: Secondary | ICD-10-CM | POA: Diagnosis not present

## 2016-12-21 DIAGNOSIS — K219 Gastro-esophageal reflux disease without esophagitis: Secondary | ICD-10-CM | POA: Diagnosis not present

## 2016-12-21 DIAGNOSIS — J449 Chronic obstructive pulmonary disease, unspecified: Secondary | ICD-10-CM | POA: Diagnosis not present

## 2016-12-21 DIAGNOSIS — I071 Rheumatic tricuspid insufficiency: Secondary | ICD-10-CM | POA: Insufficient documentation

## 2016-12-21 DIAGNOSIS — Z6832 Body mass index (BMI) 32.0-32.9, adult: Secondary | ICD-10-CM | POA: Insufficient documentation

## 2016-12-21 DIAGNOSIS — Z885 Allergy status to narcotic agent status: Secondary | ICD-10-CM | POA: Insufficient documentation

## 2016-12-21 DIAGNOSIS — Z809 Family history of malignant neoplasm, unspecified: Secondary | ICD-10-CM | POA: Insufficient documentation

## 2016-12-21 DIAGNOSIS — M48062 Spinal stenosis, lumbar region with neurogenic claudication: Secondary | ICD-10-CM | POA: Diagnosis not present

## 2016-12-21 HISTORY — DX: Spinal stenosis, site unspecified: M48.00

## 2016-12-21 LAB — COMPREHENSIVE METABOLIC PANEL
ALBUMIN: 4.2 g/dL (ref 3.5–5.0)
ALT: 24 U/L (ref 14–54)
ANION GAP: 12 (ref 5–15)
AST: 24 U/L (ref 15–41)
Alkaline Phosphatase: 86 U/L (ref 38–126)
BUN: 19 mg/dL (ref 6–20)
CALCIUM: 9.4 mg/dL (ref 8.9–10.3)
CHLORIDE: 100 mmol/L — AB (ref 101–111)
CO2: 22 mmol/L (ref 22–32)
Creatinine, Ser: 1.32 mg/dL — ABNORMAL HIGH (ref 0.44–1.00)
GFR calc non Af Amer: 39 mL/min — ABNORMAL LOW (ref >60)
GFR, EST AFRICAN AMERICAN: 45 mL/min — AB (ref >60)
GLUCOSE: 112 mg/dL — AB (ref 65–99)
POTASSIUM: 3.9 mmol/L (ref 3.5–5.1)
SODIUM: 134 mmol/L — AB (ref 135–145)
Total Bilirubin: 0.6 mg/dL (ref 0.3–1.2)
Total Protein: 7 g/dL (ref 6.5–8.1)

## 2016-12-21 LAB — CBC WITH DIFFERENTIAL/PLATELET
BASOS PCT: 1 %
Basophils Absolute: 0.1 10*3/uL (ref 0.0–0.1)
EOS ABS: 0.3 10*3/uL (ref 0.0–0.7)
EOS PCT: 4 %
HCT: 35.6 % — ABNORMAL LOW (ref 36.0–46.0)
Hemoglobin: 12.1 g/dL (ref 12.0–15.0)
LYMPHS ABS: 1.5 10*3/uL (ref 0.7–4.0)
Lymphocytes Relative: 21 %
MCH: 29.3 pg (ref 26.0–34.0)
MCHC: 34 g/dL (ref 30.0–36.0)
MCV: 86.2 fL (ref 78.0–100.0)
MONOS PCT: 13 %
Monocytes Absolute: 0.9 10*3/uL (ref 0.1–1.0)
NEUTROS PCT: 61 %
Neutro Abs: 4.2 10*3/uL (ref 1.7–7.7)
PLATELETS: 240 10*3/uL (ref 150–400)
RBC: 4.13 MIL/uL (ref 3.87–5.11)
RDW: 14.4 % (ref 11.5–15.5)
WBC: 6.9 10*3/uL (ref 4.0–10.5)

## 2016-12-21 LAB — I-STAT TROPONIN, ED: TROPONIN I, POC: 0.01 ng/mL (ref 0.00–0.08)

## 2016-12-21 LAB — D-DIMER, QUANTITATIVE (NOT AT ARMC): D DIMER QUANT: 3.29 ug{FEU}/mL — AB (ref 0.00–0.50)

## 2016-12-21 LAB — BRAIN NATRIURETIC PEPTIDE: B Natriuretic Peptide: 55.7 pg/mL (ref 0.0–100.0)

## 2016-12-21 MED ORDER — IOPAMIDOL (ISOVUE-370) INJECTION 76%
INTRAVENOUS | Status: AC
  Administered 2016-12-21: 100 mL
  Filled 2016-12-21: qty 100

## 2016-12-21 MED ORDER — SODIUM CHLORIDE 0.9 % IV BOLUS (SEPSIS)
1000.0000 mL | Freq: Once | INTRAVENOUS | Status: AC
  Administered 2016-12-21: 1000 mL via INTRAVENOUS

## 2016-12-21 MED ORDER — BENAZEPRIL HCL 40 MG PO TABS: 40.0000 mg | ORAL_TABLET | Freq: Two times a day (BID) | ORAL | Status: DC

## 2016-12-21 MED ORDER — ALBUTEROL SULFATE (2.5 MG/3ML) 0.083% IN NEBU: 5.0000 mg | INHALATION_SOLUTION | Freq: Once | RESPIRATORY_TRACT | Status: DC

## 2016-12-21 NOTE — ED Triage Notes (Signed)
Pt presents with 1 week h/o shortness of breath.  Pt was treated for asthma exacerbation with steroids and reports initially she was better, was not wheezing but continues to be short of breath.  Pt denies cough or chest discomfort.

## 2016-12-21 NOTE — ED Notes (Signed)
When ambulating pt's O2 stats ranging between 86-94 RM, heart rate 115

## 2016-12-21 NOTE — ED Provider Notes (Signed)
[facility] DEPT Provider Note   CSN: [id number] Arrival date & time: 12/21/16  1508    History   Chief Complaint Chief Complaint  Patient presents with  . Shortness of Breath    HPI Joanne Holmes is a 71 y.o. female.  71 year old female with a history of asthma, COPD presents to Emergency Department for shortness of breath. She has been having progressive shortness of breath over the past 4 days. She notes worsening shortness of breath with exertion; improvement at rest. She had mild improvement in her SOB with an albuterol nebulizer this AM, but states that she has NOT been wheezing and this feels different from her baseline asthma. She denies fever, lightheadedness, syncope or near syncope, nausea, vomiting, chest pain, leg swelling, hemoptysis. No recent surgeries or hospitalizations or prolonged travel. No PHx of ACS or DVT/PE. She has been compliant with her antihypertensive medications, but feels like her BP is higher than her normal baseline. She saw her PCP today who advised ED evaluation for persistent symptoms.   The history is provided by the patient. No language interpreter was used.  Shortness of Breath     Past Medical History:  Diagnosis Date  . Allergy   . Asthma   . COPD (chronic obstructive pulmonary disease) ([place])   . Hypertension   . Kidney stone   . Neuromuscular disorder (HCC)    carpal tunnel- left  . Pneumonia     Patient Active Problem List   Diagnosis Date Noted  . Spinal stenosis of lumbar region with neurogenic claudication 04/27/2016  . Post laminectomy syndrome 04/27/2016  . Essential hypertension 05/11/2014  . Persistent vomiting 03/18/2012  . Hypertensive urgency 03/18/2012  . COPD (chronic obstructive pulmonary disease) ([employer]) 03/18/2012    Past Surgical History:  Procedure Laterality Date  . ABDOMINAL HYSTERECTOMY    . BACK SURGERY  25+ yrs ago   lumbar laminectomy L5-S1  . BLADDER SUSPENSION  ~10 yrs ago  . CARPAL TUNNEL  RELEASE  03/08/2012   Procedure: CARPAL TUNNEL RELEASE;  Surgeon: Cammie Sickle., MD;  Location: [facility];  Service: Orthopedics;  Laterality: Left;  . SALPINGOOPHORECTOMY  ~8 yrs ago  . TONSILLECTOMY      OB History    No data available       Home Medications    Prior to Admission medications   Medication Sig Start Date End Date Taking? Authorizing Provider  albuterol (PROVENTIL HFA;VENTOLIN HFA) 108 (90 BASE) MCG/ACT inhaler Inhale 2 puffs into the lungs daily as needed for wheezing or shortness of breath.  10/04/13  Yes [provider]  albuterol (PROVENTIL) (2.5 MG/3ML) 0.083% nebulizer solution Inhale 3 mLs into the lungs every 4 (four) hours as needed. 12/04/16  Yes [provider]  amLODipine (NORVASC) 10 MG tablet Take 10 mg by mouth daily.   Yes [provider]  atorvastatin (LIPITOR) 40 MG tablet Take 40 mg by mouth at bedtime.  02/22/16 02/21/17 Yes [provider]  benazepril (LOTENSIN) 40 MG tablet Take 40 mg by mouth 2 (two) times daily. 03/06/16  Yes [provider]  Cholecalciferol (VITAMIN D3) 2000 units capsule Take 2,000 Units by mouth at bedtime.    Yes [provider]  estradiol (VIVELLE-DOT) 0.0375 MG/24HR Place 1 patch onto the skin once a week. Replace patch on Sunday   Yes [provider]  Flaxseed, Linseed, (FLAXSEED OIL) 1030 MG CAPS Take 2 capsules by mouth daily. 1400 MG   Yes [provider]  Fluticasone-Salmeterol (ADVAIR DISKUS) 250-50 MCG/DOSE AEPB Inhale 1 puff into the lungs 2 (two) times daily. Patient taking differently: Inhale 2 puffs into the lungs 2 (two) times daily as needed (asthma).  08/18/11  Yes Wendie Agreste, MD  Fluticasone-Salmeterol (ADVAIR DISKUS) 500-50 MCG/DOSE AEPB Inhale into the lungs. 02/08/16 02/07/17 Yes [provider]  hydrochlorothiazide (HYDRODIURIL) 25 MG tablet Take 25 mg by mouth daily.  03/23/16  Yes [provider]  HYDROcodone-acetaminophen (NORCO/VICODIN) 5-325 MG per tablet Take 1 tablet by mouth every 4 (four) hours as needed for moderate pain or severe pain.  03/08/12  Yes Dasnoit, Herbie Baltimore, PA-C  loratadine (CLARITIN) 10 MG tablet Take 10 mg by mouth daily.   Yes [provider]  meloxicam (MOBIC) 15 MG tablet Take 15 mg by mouth daily.  02/06/16  Yes [provider]  montelukast (SINGULAIR) 10 MG tablet Take 10 mg by mouth at bedtime.   Yes [provider]  Omega-3 Fatty Acids (FISH OIL PO) Take 400 mg by mouth daily.    Yes [provider]  pantoprazole (PROTONIX) 40 MG tablet Take 40 mg by mouth at bedtime. 08/01/16 08/01/17 Yes [provider]  traMADol (ULTRAM) 50 MG tablet Take 50 mg by mouth 3 (three) times daily. pain   Yes [provider]  ciprofloxacin (CIPRO) 500 MG tablet Take 1 tablet (500 mg total) by mouth 2 (two) times daily. Patient not taking: Reported on 12/21/2016 05/12/14   Nat Math, MD    Family History History reviewed. No pertinent family history.  Social History Social History  Substance Use Topics  . Smoking status: Former Smoker    Packs/day: 2.00    Years: 20.00    Types: Cigarettes    Quit date: 03/06/1981  . Smokeless tobacco: Never Used  . Alcohol use No     Allergies   Ceclor [cefaclor]; Cefzil [cefprozil]; Erythromycin; Griseofulvin; Morphine and related; Penicillins; and Morpholine salicylate   Review of Systems Review of Systems  Respiratory: Positive for shortness of breath.   Ten systems reviewed and are negative for acute change, except as noted in the HPI.    Physical Exam Updated Vital Signs BP (!) 178/75   Pulse 85   Temp 97.9 F (36.6 C) (Oral)   Resp 20   Ht 5' (1.524 m)   Wt 86.2 kg (190 lb)   SpO2 94%   BMI 37.11 kg/m   Physical Exam  Constitutional: She is oriented to person, place, and time. She appears well-developed and well-nourished. No distress.  Nontoxic and in  NAD  HENT:  Head: Normocephalic and atraumatic.  Eyes: Conjunctivae and EOM are normal. No scleral icterus.  Neck: Normal range of motion.  No JVD  Cardiovascular: Normal rate, regular rhythm and intact distal pulses.   Not tachycardic as noted with most recent VS  Pulmonary/Chest: Effort normal. No respiratory distress. She has no wheezes. She has no rales.  Chest expansion symmetric. Lungs CTAB.  Musculoskeletal: Normal range of motion.  No BLE edema  Neurological: She is alert and oriented to person, place, and time. She exhibits normal muscle tone. Coordination normal.  Skin: Skin is warm and dry. No rash noted. She is not diaphoretic. No erythema. No pallor.  Psychiatric: She has a normal mood and affect. Her behavior is normal.  Nursing note and vitals reviewed.    ED Treatments / Results  Labs (all labs ordered are listed, but only abnormal results are displayed) Labs Reviewed  CBC  WITH DIFFERENTIAL/PLATELET - Abnormal; Notable for the following:       Result Value   HCT 35.6 (*)    All other components within normal limits  COMPREHENSIVE METABOLIC PANEL - Abnormal; Notable for the following:    Sodium 134 (*)    Chloride 100 (*)    Glucose, Bld 112 (*)    Creatinine, Ser 1.32 (*)    GFR calc non Af Amer 39 (*)    GFR calc Af Amer 45 (*)    All other components within normal limits  D-DIMER, QUANTITATIVE (NOT AT [facility]) - Abnormal; Notable for the following:    D-Dimer, Quant 3.29 (*)    All other components within normal limits  BRAIN NATRIURETIC PEPTIDE  I-STAT TROPONIN, ED    EKG  EKG Interpretation  Date/Time:  Thursday December 21 2016 15:34:43 EDT Ventricular Rate:  96 PR Interval:  164 QRS Duration: 90 QT Interval:  364 QTC Calculation: 459 R Axis:   8 Text Interpretation:  Normal sinus rhythm Septal infarct , age undetermined Abnormal ECG ST changes less prominent compared to 2013 Confirmed by Sherwood Gambler [phone]) on 12/21/2016 9:53:33 PM        Radiology Dg Chest 2 View  Result Date: 12/21/2016 CLINICAL DATA:  Shortness of breath for 1 week EXAM: CHEST  2 VIEW COMPARISON:  05/11/2014 FINDINGS: Cardiac shadow is within normal limits. Aortic calcifications are again seen. The lungs are well aerated bilaterally. Degenerative changes of the thoracic spine are noted. No focal infiltrate is seen. Stable density related to overlapping ribs is noted in the left base. IMPRESSION: No acute abnormality noted. Electronically Signed   By: Inez Catalina M.D.   On: 12/21/2016 15:56   Ct Angio Chest Pe W And/or Wo Contrast  Result Date: 12/22/2016 CLINICAL DATA:  One week of dyspnea. Treated for asthma exacerbation with steroids. EXAM: CT ANGIOGRAPHY CHEST WITH CONTRAST TECHNIQUE: Multidetector CT imaging of the chest was performed using the standard protocol during bolus administration of intravenous contrast. Multiplanar CT image reconstructions and MIPs were obtained to evaluate the vascular anatomy. CONTRAST:  65 cc Isovue 370 IV COMPARISON:  08/16/2011 chest CT, CT abdomen 05/10/2014 FINDINGS: Cardiovascular: No acute pulmonary embolus. Coronary arterial, aortic and great vessel atherosclerosis. Borderline cardiomegaly without pericardial effusion or thickening no aortic aneurysm or dissection is noted. Mediastinum/Nodes: No enlarged mediastinal, hilar, or axillary lymph nodes. Thyroid gland, trachea, and esophagus demonstrate no significant findings. Lungs/Pleura: No pneumothorax or pleural effusion. There is dependent atelectasis focal at the right lung base. Tiny 4 mm left lower lobe pulmonary nodule, stable since 2016 and consistent with a benign finding. Upper Abdomen: Fatty atrophy of the pancreas. Small hiatal hernia. No acute intra-abdominal abnormality. No adrenal mass. No splenomegaly. Slightly lobular appearance of the right upper pole of the kidney, incompletely visualized but stable relative to 2016. Musculoskeletal: Mild degenerative change  again noted of the included dorsal spine. Review of the MIP images confirms the above findings. IMPRESSION: 1. No acute pulmonary embolus.  No acute pulmonary disease. 2. Coronary arteriosclerosis and aortic atherosclerosis. 3. Stable 4 mm left lower lobe pulmonary nodule dating back to 2016 consistent with a benign finding. Aortic Atherosclerosis (ICD10-I70.0). Electronically Signed   By: Ashley Royalty M.D.   On: 12/22/2016 00:22    Procedures Procedures (including critical care time)  Medications Ordered in ED Medications  albuterol (PROVENTIL) (2.5 MG/3ML) 0.083% nebulizer solution 5 mg ( Nebulization Canceled Entry 12/21/16 1943)  sodium chloride 0.9 % bolus 1,000 mL (  1,000 mLs Intravenous New Bag/Given 12/21/16 2256)  iopamidol (ISOVUE-370) 76 % injection (100 mLs  Contrast Given 12/21/16 2341)     Initial Impression / Assessment and Plan / ED Course  I have reviewed the triage vital signs and the nursing notes.  Pertinent labs & imaging results that were available during my care of the patient were reviewed by me and considered in my medical decision making (see chart for details).    3:51 PM 70 year old female with a history of hypertension, dyslipidemia, and asthma presents to the emergency department for dyspnea on exertion. She reports recent exacerbation of her asthma symptoms. She finished steroids one week ago. Her symptoms have been present over the last 3-4 days, but NOT associated with wheezing. She states that her dyspnea on exertion feels different from her typical asthma. She has no wheezing on my assessment. Will obtain troponin and D dimer.  10:10 PM Patient noted to become hypoxic with ambulation; SpO2 ranging from 86-94% on room air. Troponin reassuring, but D dimer is positive. Patient will need CTA to rule out PE as cause of dyspnea.  12:45 AM CTA negative for PE. Pulmonary nodule seen which is stable since 2016. No evidence of PNA or vascular congestion to suggest CHF.  Findings reviewed with the patient who verbalizes understanding.   She states that she recently got up to adjust her bed sheets. SpO2 initially 89% on room air, improving to 95% with prolonged rest. Heart Score 3-4 based on suspicion. However, there is concern that DOE may be an anginal equivalent despite initial reassuring troponin level. Patient also does become hypoxia on room air with exertion and ambulation. No hx of chronic oxygen requirement. I believe stress echocardiogram would be beneficial on a more urgent/emergent basis. No hx of prior cardiac work up. Will consult with [facility] for observation admission.  12:56 AM Case discussed with Dr. Maudie Mercury who will evaluate the patient for admission.    Final Clinical Impressions(s) / ED Diagnoses   Final diagnoses:  DOE (dyspnea on exertion)    New Prescriptions New Prescriptions   No medications on file     Antonietta Breach, Hershal Coria 12/22/16 [place], [place], MD 12/23/16 [phone]

## 2016-12-22 ENCOUNTER — Observation Stay (HOSPITAL_COMMUNITY): Payer: Medicare HMO

## 2016-12-22 ENCOUNTER — Encounter (HOSPITAL_COMMUNITY): Payer: Self-pay | Admitting: Internal Medicine

## 2016-12-22 DIAGNOSIS — R0902 Hypoxemia: Secondary | ICD-10-CM | POA: Diagnosis not present

## 2016-12-22 DIAGNOSIS — E782 Mixed hyperlipidemia: Secondary | ICD-10-CM | POA: Diagnosis not present

## 2016-12-22 DIAGNOSIS — I2510 Atherosclerotic heart disease of native coronary artery without angina pectoris: Secondary | ICD-10-CM | POA: Diagnosis not present

## 2016-12-22 DIAGNOSIS — R0609 Other forms of dyspnea: Secondary | ICD-10-CM | POA: Diagnosis not present

## 2016-12-22 LAB — TSH: TSH: 0.683 u[IU]/mL (ref 0.350–4.500)

## 2016-12-22 LAB — COMPREHENSIVE METABOLIC PANEL WITH GFR
ALT: 23 U/L (ref 14–54)
AST: 23 U/L (ref 15–41)
Albumin: 3.6 g/dL (ref 3.5–5.0)
Alkaline Phosphatase: 76 U/L (ref 38–126)
Anion gap: 11 (ref 5–15)
BUN: 10 mg/dL (ref 6–20)
CO2: 20 mmol/L — ABNORMAL LOW (ref 22–32)
Calcium: 9.1 mg/dL (ref 8.9–10.3)
Chloride: 105 mmol/L (ref 101–111)
Creatinine, Ser: 0.9 mg/dL (ref 0.44–1.00)
GFR calc Af Amer: >60 mL/min
GFR calc non Af Amer: >60 mL/min
Glucose, Bld: 105 mg/dL — ABNORMAL HIGH (ref 65–99)
Potassium: 3.4 mmol/L — ABNORMAL LOW (ref 3.5–5.1)
Sodium: 136 mmol/L (ref 135–145)
Total Bilirubin: 0.9 mg/dL (ref 0.3–1.2)
Total Protein: 6.3 g/dL — ABNORMAL LOW (ref 6.5–8.1)

## 2016-12-22 LAB — CBC
HCT: 34.4 % — ABNORMAL LOW (ref 36.0–46.0)
Hemoglobin: 11.5 g/dL — ABNORMAL LOW (ref 12.0–15.0)
MCH: 28.8 pg (ref 26.0–34.0)
MCHC: 33.4 g/dL (ref 30.0–36.0)
MCV: 86 fL (ref 78.0–100.0)
PLATELETS: 226 10*3/uL (ref 150–400)
RBC: 4 MIL/uL (ref 3.87–5.11)
RDW: 14.1 % (ref 11.5–15.5)
WBC: 7.8 10*3/uL (ref 4.0–10.5)

## 2016-12-22 LAB — TROPONIN I: Troponin I: <0.03 ng/mL

## 2016-12-22 MED ORDER — FUROSEMIDE 10 MG/ML IJ SOLN
40.0000 mg | Freq: Two times a day (BID) | INTRAMUSCULAR | Status: DC
  Administered 2016-12-23: 40 mg via INTRAVENOUS
  Filled 2016-12-22: qty 4

## 2016-12-22 MED ORDER — HYDRALAZINE HCL 20 MG/ML IJ SOLN
10.0000 mg | Freq: Four times a day (QID) | INTRAMUSCULAR | Status: DC | PRN
  Administered 2016-12-22: 10 mg via INTRAVENOUS
  Filled 2016-12-22: qty 1

## 2016-12-22 MED ORDER — ALBUTEROL SULFATE HFA 108 (90 BASE) MCG/ACT IN AERS: 2.0000 | INHALATION_SPRAY | Freq: Every day | RESPIRATORY_TRACT | Status: DC | PRN

## 2016-12-22 MED ORDER — TECHNETIUM TC 99M TETROFOSMIN IV KIT
10.0000 | PACK | Freq: Once | INTRAVENOUS | Status: AC | PRN
  Administered 2016-12-22: 10 via INTRAVENOUS

## 2016-12-22 MED ORDER — MOMETASONE FURO-FORMOTEROL FUM 200-5 MCG/ACT IN AERO
2.0000 | INHALATION_SPRAY | Freq: Two times a day (BID) | RESPIRATORY_TRACT | Status: DC
  Administered 2016-12-23: 2 via RESPIRATORY_TRACT
  Filled 2016-12-22: qty 8.8

## 2016-12-22 MED ORDER — LORATADINE 10 MG PO TABS
10.0000 mg | ORAL_TABLET | Freq: Every day | ORAL | Status: DC
  Administered 2016-12-22 – 2016-12-23 (×2): 10 mg via ORAL
  Filled 2016-12-22 (×2): qty 1

## 2016-12-22 MED ORDER — IRBESARTAN 150 MG PO TABS
150.0000 mg | ORAL_TABLET | Freq: Every day | ORAL | Status: DC
  Administered 2016-12-22 – 2016-12-23 (×2): 150 mg via ORAL
  Filled 2016-12-22 (×3): qty 1

## 2016-12-22 MED ORDER — MELOXICAM 7.5 MG PO TABS
15.0000 mg | ORAL_TABLET | Freq: Every day | ORAL | Status: DC
  Filled 2016-12-22: qty 2

## 2016-12-22 MED ORDER — PANTOPRAZOLE SODIUM 40 MG PO TBEC
40.0000 mg | DELAYED_RELEASE_TABLET | Freq: Every day | ORAL | Status: DC
  Administered 2016-12-22: 40 mg via ORAL
  Filled 2016-12-22: qty 1

## 2016-12-22 MED ORDER — FUROSEMIDE 10 MG/ML IJ SOLN: 40.0000 mg | Freq: Once | INTRAMUSCULAR | Status: DC

## 2016-12-22 MED ORDER — VITAMIN D 1000 UNITS PO TABS
2000.0000 [IU] | ORAL_TABLET | Freq: Every day | ORAL | Status: DC
Start: 2016-12-22 — End: 2016-12-23
  Administered 2016-12-22: 2000 [IU] via ORAL
  Filled 2016-12-22: qty 2

## 2016-12-22 MED ORDER — MONTELUKAST SODIUM 10 MG PO TABS
10.0000 mg | ORAL_TABLET | Freq: Every day | ORAL | Status: DC
  Administered 2016-12-22: 10 mg via ORAL
  Filled 2016-12-22: qty 1

## 2016-12-22 MED ORDER — ENOXAPARIN SODIUM 40 MG/0.4ML ~~LOC~~ SOLN
40.0000 mg | Freq: Every day | SUBCUTANEOUS | Status: DC
  Administered 2016-12-23: 40 mg via SUBCUTANEOUS
  Filled 2016-12-22 (×2): qty 0.4

## 2016-12-22 MED ORDER — ALBUTEROL SULFATE (2.5 MG/3ML) 0.083% IN NEBU: 3.0000 mL | INHALATION_SOLUTION | RESPIRATORY_TRACT | Status: DC | PRN

## 2016-12-22 MED ORDER — BENAZEPRIL HCL 40 MG PO TABS
20.0000 mg | ORAL_TABLET | Freq: Two times a day (BID) | ORAL | Status: DC
  Administered 2016-12-22: 20 mg via ORAL
  Filled 2016-12-22: qty 1

## 2016-12-22 MED ORDER — HYDROCODONE-ACETAMINOPHEN 5-325 MG PO TABS
1.0000 | ORAL_TABLET | ORAL | Status: DC | PRN
  Administered 2016-12-22: 1 via ORAL
  Filled 2016-12-22: qty 1

## 2016-12-22 MED ORDER — ACETAMINOPHEN 650 MG RE SUPP: 650.0000 mg | Freq: Four times a day (QID) | RECTAL | Status: DC | PRN

## 2016-12-22 MED ORDER — ESTRADIOL 0.0375 MG/24HR TD PTWK: 0.0375 mg | MEDICATED_PATCH | TRANSDERMAL | Status: DC

## 2016-12-22 MED ORDER — MOMETASONE FURO-FORMOTEROL FUM 200-5 MCG/ACT IN AERO
2.0000 | INHALATION_SPRAY | Freq: Two times a day (BID) | RESPIRATORY_TRACT | Status: DC
  Filled 2016-12-22: qty 8.8

## 2016-12-22 MED ORDER — REGADENOSON 0.4 MG/5ML IV SOLN
INTRAVENOUS | Status: AC
  Filled 2016-12-22: qty 5

## 2016-12-22 MED ORDER — REGADENOSON 0.4 MG/5ML IV SOLN
0.4000 mg | Freq: Once | INTRAVENOUS | Status: AC
  Administered 2016-12-22: 0.4 mg via INTRAVENOUS
  Filled 2016-12-22: qty 5

## 2016-12-22 MED ORDER — SODIUM CHLORIDE 0.9 % IV SOLN
INTRAVENOUS | Status: DC
  Administered 2016-12-22: 50 mL/h via INTRAVENOUS

## 2016-12-22 MED ORDER — AMLODIPINE BESYLATE 10 MG PO TABS
10.0000 mg | ORAL_TABLET | Freq: Every day | ORAL | Status: DC
  Administered 2016-12-22 – 2016-12-23 (×2): 10 mg via ORAL
  Filled 2016-12-22: qty 1
  Filled 2016-12-22: qty 2

## 2016-12-22 MED ORDER — ATORVASTATIN CALCIUM 40 MG PO TABS
40.0000 mg | ORAL_TABLET | Freq: Every day | ORAL | Status: DC
  Administered 2016-12-22: 40 mg via ORAL
  Filled 2016-12-22: qty 1

## 2016-12-22 MED ORDER — TECHNETIUM TC 99M TETROFOSMIN IV KIT
30.0000 | PACK | Freq: Once | INTRAVENOUS | Status: AC | PRN
  Administered 2016-12-22: 30 via INTRAVENOUS

## 2016-12-22 MED ORDER — TRAMADOL HCL 50 MG PO TABS
50.0000 mg | ORAL_TABLET | Freq: Three times a day (TID) | ORAL | Status: DC
  Administered 2016-12-22 – 2016-12-23 (×4): 50 mg via ORAL
  Filled 2016-12-22 (×4): qty 1

## 2016-12-22 MED ORDER — ACETAMINOPHEN 325 MG PO TABS: 650.0000 mg | ORAL_TABLET | Freq: Four times a day (QID) | ORAL | Status: DC | PRN

## 2016-12-22 MED ORDER — HYDROCHLOROTHIAZIDE 25 MG PO TABS
25.0000 mg | ORAL_TABLET | Freq: Every day | ORAL | Status: DC
  Administered 2016-12-22 – 2016-12-23 (×2): 25 mg via ORAL
  Filled 2016-12-22 (×3): qty 1

## 2016-12-22 NOTE — Consult Note (Addendum)
Cardiology Consultation:   Patient ID: MELYNDA Holmes; [id number]; 01-03-1946   Admit date: 12/21/2016 Date of Consult: 12/22/2016  Primary Care Provider: [name], PA-C Primary Cardiologist: new - Dr. Meda Coffee Primary Electrophysiologist:     Patient Profile:   Joanne Holmes is a 71 y.o. female with a hx of COPD, asthma, and HTN who is being seen today for the evaluation of SOB at the request of Dr. Verlon Au.  History of Present Illness:   Joanne Holmes presented to her PCP on 12/21/16 with c/o a 4 days history of shortness of breath and fatigue. She tried her home OTC medications without relief. She was sent to the [facility] from her PCP office for further evaluation. Upon arrival, D-dimer was found elevated. CTA was negative for PE. CXR with cardiomegaly, troponin x 2 negative.  BNP was not elevated.   Primary team ordered stress myoview today which showed no reversible ischemia and normal LVEF.   She states she had a bad asthma attack and the shortness of breath never improved.   Past Medical History:  Diagnosis Date  . Allergy   . Asthma   . COPD (chronic obstructive pulmonary disease) ([place])   . Hypertension   . Kidney stone   . Neuromuscular disorder ([facility])    carpal tunnel- left  . Pneumonia     Past Surgical History:  Procedure Laterality Date  . ABDOMINAL HYSTERECTOMY    . BACK SURGERY  25+ yrs ago   lumbar laminectomy L5-S1  . BLADDER SUSPENSION  ~10 yrs ago  . CARPAL TUNNEL RELEASE  03/08/2012   Procedure: CARPAL TUNNEL RELEASE;  Surgeon: Cammie Sickle., MD;  Location: [place];  Service: Orthopedics;  Laterality: Left;  . SALPINGOOPHORECTOMY  ~8 yrs ago  . TONSILLECTOMY       Home Medications:  Prior to Admission medications   Medication Sig Start Date End Date Taking? Authorizing Provider  albuterol (PROVENTIL HFA;VENTOLIN HFA) 108 (90 BASE) MCG/ACT inhaler Inhale 2 puffs into the lungs daily as needed for wheezing or  shortness of breath.  10/04/13  Yes [provider]  albuterol (PROVENTIL) (2.5 MG/3ML) 0.083% nebulizer solution Inhale 3 mLs into the lungs every 4 (four) hours as needed. 12/04/16  Yes [provider]  amLODipine (NORVASC) 10 MG tablet Take 10 mg by mouth daily.   Yes [provider]  atorvastatin (LIPITOR) 40 MG tablet Take 40 mg by mouth at bedtime.  02/22/16 02/21/17 Yes [provider]  benazepril (LOTENSIN) 40 MG tablet Take 40 mg by mouth 2 (two) times daily. 03/06/16  Yes [provider]  Cholecalciferol (VITAMIN D3) 2000 units capsule Take 2,000 Units by mouth at bedtime.    Yes [provider]  estradiol (VIVELLE-DOT) 0.0375 MG/24HR Place 1 patch onto the skin once a week. Replace patch on Sunday   Yes [provider]  Flaxseed, Linseed, (FLAXSEED OIL) 1030 MG CAPS Take 2 capsules by mouth daily. 1400 MG   Yes [provider]  Fluticasone-Salmeterol (ADVAIR DISKUS) 250-50 MCG/DOSE AEPB Inhale 1 puff into the lungs 2 (two) times daily. Patient taking differently: Inhale 2 puffs into the lungs 2 (two) times daily as needed (asthma).  08/18/11  Yes Wendie Agreste, MD  Fluticasone-Salmeterol (ADVAIR DISKUS) 500-50 MCG/DOSE AEPB Inhale into the lungs. 02/08/16 02/07/17 Yes [provider]  hydrochlorothiazide (HYDRODIURIL) 25 MG tablet Take 25 mg by mouth daily.  03/23/16  Yes [provider]  HYDROcodone-acetaminophen (NORCO/VICODIN) 5-325 MG  per tablet Take 1 tablet by mouth every 4 (four) hours as needed for moderate pain or severe pain.  03/08/12  Yes Dasnoit, Herbie [place], PA-C  loratadine (CLARITIN) 10 MG tablet Take 10 mg by mouth daily.   Yes [provider]  meloxicam (MOBIC) 15 MG tablet Take 15 mg by mouth daily.  02/06/16  Yes [provider]  montelukast (SINGULAIR) 10 MG tablet Take 10 mg by mouth at bedtime.   Yes [provider]  Omega-3 Fatty Acids (FISH OIL PO) Take  400 mg by mouth daily.    Yes [provider]  pantoprazole (PROTONIX) 40 MG tablet Take 40 mg by mouth at bedtime. 08/01/16 08/01/17 Yes [provider]  traMADol (ULTRAM) 50 MG tablet Take 50 mg by mouth 3 (three) times daily. pain   Yes [provider]  ciprofloxacin (CIPRO) 500 MG tablet Take 1 tablet (500 mg total) by mouth 2 (two) times daily. Patient not taking: Reported on 12/21/2016 05/12/14   Nat Math, MD    Inpatient Medications: Scheduled Meds: . albuterol  5 mg Nebulization Once  . amLODipine  10 mg Oral Daily  . atorvastatin  40 mg Oral QHS  . benazepril  20 mg Oral BID  . cholecalciferol  2,000 Units Oral QHS  . enoxaparin (LOVENOX) injection  40 mg Subcutaneous Daily  . furosemide  40 mg Intravenous Once  . hydrochlorothiazide  25 mg Oral Daily  . loratadine  10 mg Oral Daily  . meloxicam  15 mg Oral Q breakfast  . mometasone-formoterol  2 puff Inhalation BID  . montelukast  10 mg Oral QHS  . pantoprazole  40 mg Oral QHS  . regadenoson      . traMADol  50 mg Oral TID   Continuous Infusions:  PRN Meds: acetaminophen **OR** acetaminophen, albuterol, albuterol, hydrALAZINE, HYDROcodone-acetaminophen  Allergies:    Allergies  Allergen Reactions  . Ceclor [Cefaclor] Other (See Comments)    Pt does not remember  . Cefzil [Cefprozil] Other (See Comments)    Pt does not remember  . Erythromycin Itching and Rash  . Griseofulvin Swelling  . Morphine And Related Itching  . Penicillins Anaphylaxis    Has patient had a PCN reaction causing immediate rash, facial/tongue/throat swelling, SOB or lightheadedness with hypotension: Yes Has patient had a PCN reaction causing severe rash involving mucus membranes or skin necrosis: No Has patient had a PCN reaction that required hospitalization: No Has patient had a PCN reaction occurring within the last 10 years: No If all of the above answers are "NO", then may proceed with Cephalosporin use.  Marland Kitchen  Morpholine Salicylate     Social History:   Social History   Social History  . Marital status: Married    Spouse name: N/A  . Number of children: N/A  . Years of education: N/A   Occupational History  . Not on file.   Social History Main Topics  . Smoking status: Former Smoker    Packs/day: 2.00    Years: 20.00    Types: Cigarettes    Quit date: 03/06/1981  . Smokeless tobacco: Never Used  . Alcohol use No  . Drug use: No  . Sexual activity: Not Currently   Other Topics Concern  . Not on file   Social History Narrative  . No narrative on file    Family History:    Family History  Problem Relation Age of Onset  . CAD Mother   . Cancer Father  lymphosarcoma     ROS:  Please see the history of present illness.  ROS  All other ROS reviewed and negative.     Physical Exam/Data:   Vitals:   12/22/16 1125 12/22/16 1353 12/22/16 1400 12/22/16 1415  BP: (!) 169/64 (!) 182/69 (!) 167/70 (!) 148/61  Pulse: 98 (!) 103 98 97  Resp: 16     Temp:      TempSrc:      SpO2: 93% 96% 96% 95%  Weight:      Height:        Intake/Output Summary (Last 24 hours) at 12/22/16 1521 Last data filed at 12/22/16 0205  Gross per 24 hour  Intake             1000 ml  Output                0 ml  Net             1000 ml   Filed Weights   12/21/16 1527  Weight: 190 lb (86.2 kg)   Body mass index is 37.11 kg/m.  General:  Well nourished, well developed, in no acute distress HEENT: normal Neck: no JVD Vascular: No carotid bruits; FA pulses 2+ bilaterally without bruits  Cardiac:  normal S1, S2; RRR; + murmur  Lungs:  Respirations unlabored Abd: soft, nontender, no hepatomegaly  Ext: no edema Musculoskeletal:  No deformities, BUE and BLE strength normal and equal Skin: warm and dry  Neuro:  CNs 2-12 intact, no focal abnormalities noted Psych:  Normal affect   EKG:  The EKG was personally reviewed and demonstrates:  sinus Telemetry:  Telemetry was personally  reviewed and demonstrates:  Sinus, poor R wave progression  Relevant CV Studies:  Echo pending  Myoview negative for reversible ischemia, normal EF  CTA with nonobstructive CAD  Laboratory Data:  Chemistry  Recent Labs Lab 12/21/16 1541 12/22/16 0432  NA 134* 136  K 3.9 3.4*  CL 100* 105  CO2 22 20*  GLUCOSE 112* 105*  BUN 19 10  CREATININE 1.32* 0.90  CALCIUM 9.4 9.1  GFRNONAA 39* >60  GFRAA 45* >60  ANIONGAP 12 11     Recent Labs Lab 12/21/16 1541 12/22/16 0432  PROT 7.0 6.3*  ALBUMIN 4.2 3.6  AST 24 23  ALT 24 23  ALKPHOS 86 76  BILITOT 0.6 0.9   Hematology  Recent Labs Lab 12/21/16 1541 12/22/16 0432  WBC 6.9 7.8  RBC 4.13 4.00  HGB 12.1 11.5*  HCT 35.6* 34.4*  MCV 86.2 86.0  MCH 29.3 28.8  MCHC 34.0 33.4  RDW 14.4 14.1  PLT 240 226   Cardiac Enzymes  Recent Labs Lab 12/22/16 0432  TROPONINI <0.03     Recent Labs Lab 12/21/16 2103  TROPIPOC 0.01    BNP  Recent Labs Lab 12/21/16 1541  BNP 55.7    DDimer   Recent Labs Lab 12/21/16 2039  DDIMER 3.29*    Radiology/Studies:  Dg Chest 2 View  Result Date: 12/21/2016 CLINICAL DATA:  Shortness of breath for 1 week EXAM: CHEST  2 VIEW COMPARISON:  05/11/2014 FINDINGS: Cardiac shadow is within normal limits. Aortic calcifications are again seen. The lungs are well aerated bilaterally. Degenerative changes of the thoracic spine are noted. No focal infiltrate is seen. Stable density related to overlapping ribs is noted in the left base. IMPRESSION: No acute abnormality noted. Electronically Signed   By: Inez Catalina M.D.   On: 12/21/2016 15:56  Ct Angio Chest Pe W And/or Wo Contrast  Result Date: 12/22/2016 CLINICAL DATA:  One week of dyspnea. Treated for asthma exacerbation with steroids. EXAM: CT ANGIOGRAPHY CHEST WITH CONTRAST TECHNIQUE: Multidetector CT imaging of the chest was performed using the standard protocol during bolus administration of intravenous contrast.  Multiplanar CT image reconstructions and MIPs were obtained to evaluate the vascular anatomy. CONTRAST:  65 cc Isovue 370 IV COMPARISON:  08/16/2011 chest CT, CT abdomen 05/10/2014 FINDINGS: Cardiovascular: No acute pulmonary embolus. Coronary arterial, aortic and great vessel atherosclerosis. Borderline cardiomegaly without pericardial effusion or thickening no aortic aneurysm or dissection is noted. Mediastinum/Nodes: No enlarged mediastinal, hilar, or axillary lymph nodes. Thyroid gland, trachea, and esophagus demonstrate no significant findings. Lungs/Pleura: No pneumothorax or pleural effusion. There is dependent atelectasis focal at the right lung base. Tiny 4 mm left lower lobe pulmonary nodule, stable since 2016 and consistent with a benign finding. Upper Abdomen: Fatty atrophy of the pancreas. Small hiatal hernia. No acute intra-abdominal abnormality. No adrenal mass. No splenomegaly. Slightly lobular appearance of the right upper pole of the kidney, incompletely visualized but stable relative to 2016. Musculoskeletal: Mild degenerative change again noted of the included dorsal spine. Review of the MIP images confirms the above findings. IMPRESSION: 1. No acute pulmonary embolus.  No acute pulmonary disease. 2. Coronary arteriosclerosis and aortic atherosclerosis. 3. Stable 4 mm left lower lobe pulmonary nodule dating back to 2016 consistent with a benign finding. Aortic Atherosclerosis (ICD10-I70.0). Electronically Signed   By: Ashley Royalty M.D.   On: 12/22/2016 00:22   Nm Myocar Multi W/spect W/wall Motion / Ef  Result Date: 12/22/2016 CLINICAL DATA:  hypertension. Shortness of breath. Asthma/COPD. Ex-smoker. EXAM: MYOCARDIAL IMAGING WITH SPECT (REST AND PHARMACOLOGIC-STRESS) GATED LEFT VENTRICULAR WALL MOTION STUDY LEFT VENTRICULAR EJECTION FRACTION TECHNIQUE: Standard myocardial SPECT imaging was performed after resting intravenous injection of 10 mCi Tc-48m tetrofosmin. Subsequently, intravenous  infusion of Lexiscan was performed under the supervision of the Cardiology staff. At peak effect of the drug, 30 mCi Tc-44m tetrofosmin was injected intravenously and standard myocardial SPECT imaging was performed. Quantitative gated imaging was also performed to evaluate left ventricular wall motion, and estimate left ventricular ejection fraction. COMPARISON:  Chest CT of 12/21/2016. FINDINGS: Perfusion: On rest, there is mildly decreased inferior wall activity which may represent diaphragmatic attenuation. No reversibility to suggest inducible ischemia. Wall Motion: Possible mild hypokinesis involving the septum. Left Ventricular Ejection Fraction: 59 % End diastolic volume 85 ml End systolic volume 35 ml IMPRESSION: 1. No reversible ischemia or infarction. 2. Possible mild septal ischemia. 3. Left ventricular ejection fraction 59% 4. Non invasive risk stratification*: Low *2012 Appropriate Use Criteria for Coronary Revascularization Focused Update: J Am Coll Cardiol. 6226;33(3):545-625. http://content.airportbarriers.com.aspx?articleid=[email or web address] Electronically Signed   By: Abigail Miyamoto M.D.   On: 12/22/2016 12:31    Assessment and Plan:   1. Dyspnea, hypoxia, DOE, COPD - pt has a history of asthma and has had six "flares" this year - she had increased SOB this morning and took a breathing treatment without relief - CTA negative for PE following an elevated D-dimer - troponin x 2 negative - EKG without signs of acute ischemia - stress myoview today negative for reversible ischemia and normal EF - CTA reviewed and shows nonobstructive ahterosclerosis - this dyspnea and DOE with hypoxia is likely not cardiac in nature, but will check an echo for structure and function, including signs of pulmonary hypertension. If echo normal, no further ischemic evaluation necessary.  2. AKI -  sCr on admission 1.32, today is normal at 0.90   3. HTN - home meds include norvasc, benazepril, HCTZ - changed ACEI  to irbesartan for better pressure control in the setting of asthma and COPD  4. HLD - continue statin, managed by PCP - no lipid panel in EPIC  For questions or updates, please contact [place] Please consult [email or web address] for contact info under Cardiology/STEMI.   Signed, Tami Lin [place], [place]  12/22/2016 3:21 PM  The patient was seen, examined and discussed with Minette Brine , PA-C and I agree with the above.   71 y.o. female with a hx of COPD, asthma, and HTN who is being seen today for the evaluation of SOB at the request of Dr. Verlon Au. The patient has a long-standing history of asthma as a child that later resolved and reappeared several years ago, this has required several treatments with prednisone last treatment completed 2 weeks ago. She also has history of remote smoking. She has presented with 4-5 days history of shortness of breath and fatigue, she states that she gets so short of breath but she is unable to use her inhaler as she is unable to inhale.  She came to [facility] ER, on physical exam she has no JVDs, no carotid bruit, regular rate and rhythm, 2/6 systolic murmur most heard in the right upper sternal border, her lungs are clear, she has good peripheral pulses and no edema. Her baseline EKG shows sinus rhythm with poor R-wave progression in the anterior leads but otherwise unremarkable. Her troponin is negative 2, creatinine is normal, she is mildly anemic. Chest x-ray shows no acute abnormalities. Chest CT shows no pulmonary embolism, significant calcification of all 3 coronary arteries predominantly in LAD and RCA as well as atherosclerosis in the entire thoracic aorta. She underwent nuclear stress testing today that showed no prior myocardial infarction or ischemia, and it's technically a normal stress test with LVEF of 59%.  Echocardiogram to further evaluate LVEF and valvular abnormalities is pending. It will be also very helpful to see if she has any  degree of pulmonary hypertension.  Conclusion:  71 year old female with history of asthma and COPD admitted with significant shortness of breath, so far cardiac workup negative for ischemia prior infarct, acute ACS, they're awaiting echo for evaluation of follow her abnormalities and possible pulmonary hypertension. She does have an evidence of coronary artery disease that is nonobstructive based on negative stress test. An aggressive risk factor modification is recommended. Her blood pressure is severely elevated she is on amlodipine 10 mg daily hydrochlorothiazide 25 mg daily and was on Benzapril, I would switch to irbesartan 150 mg daily and uptitrate to 300 mg as needed.  Ena Dawley, MD 12/22/2016

## 2016-12-22 NOTE — Progress Notes (Signed)
Patient trasfered from ED to 5W16 via stretcher; alert and oriented x 4; no complaints of pain; IV in RAC running fluids @50cc /hr; skin intact. Orient patient to room and unit; watch safety video; gave patient care guide; instructed how to use the call bell and  fall risk precautions. Will continue to monitor the patient.

## 2016-12-22 NOTE — ED Notes (Signed)
Pt returned from Nuclear Medicine

## 2016-12-22 NOTE — ED Notes (Signed)
Pt states she has not had her "benazapril " this am-- med not ordered-- will call MD

## 2016-12-22 NOTE — Care Management Important Message (Signed)
Important Message  Patient Details  Name: Joanne Holmes MRN: [id number] Date of Birth: 03-11-1946   Medicare Important Message Given:  Yes    Nathen May 12/22/2016, 3:34 PM

## 2016-12-22 NOTE — ED Notes (Signed)
Pt placed on hospital bed for comfort.

## 2016-12-22 NOTE — H&P (Signed)
TRH H&P   Patient Demographics:    Joanne Holmes, is a 71 y.o. female  MRN: [id number]   DOB - 20-Jan-1946  Admit Date - 12/21/2016  Outpatient Primary MD for the patient is [place], [place], [place]   Nilda Simmer (primary care)  Referring MD/NP/PA: Antonietta Breach  Outpatient Specialists:   Patient coming from: home  Chief Complaint  Patient presents with  . Shortness of Breath      HPI:    Joanne Holmes  is a 71 y.o. female, w Asthma (6 flares this past year),  Started on late Monday,  Appears to be exertional.  This morning took a breathing treatment and it helped.  Pt denies fever, chills, cough, cp, palp, n/v, diarrhea, brbpr, black stool.  Pt presented to ED for evaluation of dyspnea.   In ED.  CTA chest=>  IMPRESSION: 1. No acute pulmonary embolus.  No acute pulmonary disease. 2. Coronary arteriosclerosis and aortic atherosclerosis. 3. Stable 4 mm left lower lobe pulmonary nodule dating back to 2016 consistent with a benign finding.  Pox 86% on ambulation. Pt will be admitted for dyspnea with exertion, r/o ischemic heart disease.      Review of systems:    In addition to the HPI above, No Fever-chills, No Headache, No changes with Vision or hearing, No problems swallowing food or Liquids, No Chest pain, No Abdominal pain, No Nausea or Vommitting, Bowel movements are regular, No Blood in stool or Urine, No dysuria, No new skin rashes or bruises, No new joints pains-aches,  No new weakness, tingling, numbness in any extremity, No recent weight gain or loss, No polyuria, polydypsia or polyphagia, No significant Mental Stressors.  A full 10 point Review of Systems was done, except as stated above, all other Review of Systems were negative.   With Past History of the following :    Past Medical History:  Diagnosis Date  . Allergy   . Asthma   .  COPD (chronic obstructive pulmonary disease) ([place])   . Hypertension   . Kidney stone   . Neuromuscular disorder (HCC)    carpal tunnel- left  . Pneumonia       Past Surgical History:  Procedure Laterality Date  . ABDOMINAL HYSTERECTOMY    . BACK SURGERY  25+ yrs ago   lumbar laminectomy L5-S1  . BLADDER SUSPENSION  ~10 yrs ago  . CARPAL TUNNEL RELEASE  03/08/2012   Procedure: CARPAL TUNNEL RELEASE;  Surgeon: Cammie Sickle., MD;  Location: [facility];  Service: Orthopedics;  Laterality: Left;  . SALPINGOOPHORECTOMY  ~8 yrs ago  . TONSILLECTOMY        Social History:     Social History  Substance Use Topics  . Smoking status: Former Smoker    Packs/day: 2.00    Years: 20.00    Types: Cigarettes    Quit date:  03/06/1981  . Smokeless tobacco: Never Used  . Alcohol use No     Lives -  At home   Mobility -   Walks by self   Family History :     Family History  Problem Relation Age of Onset  . CAD Mother   . Cancer Father        lymphosarcoma      Home Medications:   Prior to Admission medications   Medication Sig Start Date End Date Taking? Authorizing Provider  albuterol (PROVENTIL HFA;VENTOLIN HFA) 108 (90 BASE) MCG/ACT inhaler Inhale 2 puffs into the lungs daily as needed for wheezing or shortness of breath.  10/04/13  Yes [provider]  albuterol (PROVENTIL) (2.5 MG/3ML) 0.083% nebulizer solution Inhale 3 mLs into the lungs every 4 (four) hours as needed. 12/04/16  Yes [provider]  amLODipine (NORVASC) 10 MG tablet Take 10 mg by mouth daily.   Yes [provider]  atorvastatin (LIPITOR) 40 MG tablet Take 40 mg by mouth at bedtime.  02/22/16 02/21/17 Yes [provider]  benazepril (LOTENSIN) 40 MG tablet Take 40 mg by mouth 2 (two) times daily. 03/06/16  Yes [provider]  Cholecalciferol (VITAMIN D3) 2000 units capsule Take 2,000 Units by mouth at bedtime.    Yes [provider]  estradiol (VIVELLE-DOT) 0.0375 MG/24HR Place 1 patch onto the skin once a week. Replace patch on Sunday   Yes [provider]  Flaxseed, Linseed, (FLAXSEED OIL) 1030 MG CAPS Take 2 capsules by mouth daily. 1400 MG   Yes [provider]  Fluticasone-Salmeterol (ADVAIR DISKUS) 250-50 MCG/DOSE AEPB Inhale 1 puff into the lungs 2 (two) times daily. Patient taking differently: Inhale 2 puffs into the lungs 2 (two) times daily as needed (asthma).  08/18/11  Yes Wendie Agreste, MD  Fluticasone-Salmeterol (ADVAIR DISKUS) 500-50 MCG/DOSE AEPB Inhale into the lungs. 02/08/16 02/07/17 Yes [provider]  hydrochlorothiazide (HYDRODIURIL) 25 MG tablet Take 25 mg by mouth daily.  03/23/16  Yes [provider]  HYDROcodone-acetaminophen (NORCO/VICODIN) 5-325 MG per tablet Take 1 tablet by mouth every 4 (four) hours as needed for moderate pain or severe pain.  03/08/12  Yes Dasnoit, Herbie [place], PA-C  loratadine (CLARITIN) 10 MG tablet Take 10 mg by mouth daily.   Yes [provider]  meloxicam (MOBIC) 15 MG tablet Take 15 mg by mouth daily.  02/06/16  Yes [provider]  montelukast (SINGULAIR) 10 MG tablet Take 10 mg by mouth at bedtime.   Yes [provider]  Omega-3 Fatty Acids (FISH OIL PO) Take 400 mg by mouth daily.    Yes [provider]  pantoprazole (PROTONIX) 40 MG tablet Take 40 mg by mouth at bedtime. 08/01/16 08/01/17 Yes [provider]  traMADol (ULTRAM) 50 MG tablet Take 50 mg by mouth 3 (three) times daily. pain   Yes [provider]  ciprofloxacin (CIPRO) 500 MG tablet Take 1 tablet (500 mg total) by mouth 2 (two) times daily. Patient not taking: Reported on 12/21/2016 05/12/14   Nat Math, MD     Allergies:     Allergies  Allergen Reactions  . Ceclor [Cefaclor] Other (See Comments)    Pt does not remember  . Cefzil [Cefprozil] Other (See Comments)    Pt does not remember  . Erythromycin Itching  and Rash  . Griseofulvin Swelling  . Morphine And Related Itching  . Penicillins Anaphylaxis    Has patient had a PCN reaction causing  immediate rash, facial/tongue/throat swelling, SOB or lightheadedness with hypotension: Yes Has patient had a PCN reaction causing severe rash involving mucus membranes or skin necrosis: No Has patient had a PCN reaction that required hospitalization: No Has patient had a PCN reaction occurring within the last 10 years: No If all of the above answers are "NO", then may proceed with Cephalosporin use.  Marland Kitchen Morpholine Salicylate      Physical Exam:   Vitals  Blood pressure (!) 192/81, pulse 90, temperature 97.9 F (36.6 C), temperature source Oral, resp. rate 20, height 5' (1.524 m), weight 86.2 kg (190 lb), SpO2 92 %.   1. General lying in bed in NAD,    2. Normal affect and insight, Not Suicidal or Homicidal, Awake Alert, Oriented X 3.  3. No F.N deficits, ALL C.Nerves Intact, Strength 5/5 all 4 extremities, Sensation intact all 4 extremities, Plantars down going.  4. Ears and Eyes appear Normal, Conjunctivae clear, PERRLA. Moist Oral Mucosa.  5. Supple Neck, No JVD, No cervical lymphadenopathy appriciated, No Carotid Bruits.  6. Symmetrical Chest wall movement, Good air movement bilaterally, slight tightness, no crackles no wheezing.   7. RRR, No Gallops, Rubs or Murmurs, No Parasternal Heave.  8. Positive Bowel Sounds, Abdomen Soft, No tenderness, No organomegaly appriciated,No rebound -guarding or rigidity.  9.  No Cyanosis, Normal Skin Turgor, No Skin Rash or Bruise.  10. Good muscle tone,  joints appear normal , no effusions, Normal ROM.  11. No Palpable Lymph Nodes in Neck or Axillae     Data Review:    CBC  Recent Labs Lab 12/21/16 1541  WBC 6.9  HGB 12.1  HCT 35.6*  PLT 240  MCV 86.2  MCH 29.3  MCHC 34.0  RDW 14.4  LYMPHSABS 1.5  MONOABS 0.9  EOSABS 0.3  BASOSABS 0.1    ------------------------------------------------------------------------------------------------------------------  Chemistries   Recent Labs Lab 12/21/16 1541  NA 134*  K 3.9  CL 100*  CO2 22  GLUCOSE 112*  BUN 19  CREATININE 1.32*  CALCIUM 9.4  AST 24  ALT 24  ALKPHOS 86  BILITOT 0.6   ------------------------------------------------------------------------------------------------------------------ estimated creatinine clearance is 37 mL/min (A) (by C-G formula based on SCr of 1.32 mg/dL (H)). ------------------------------------------------------------------------------------------------------------------ No results for input(s): TSH, T4TOTAL, T3FREE, THYROIDAB in the last 72 hours.  Invalid input(s): FREET3  Coagulation profile No results for input(s): INR, PROTIME in the last 168 hours. -------------------------------------------------------------------------------------------------------------------  Recent Labs  12/21/16 2039  DDIMER 3.29*   -------------------------------------------------------------------------------------------------------------------  Cardiac Enzymes No results for input(s): CKMB, TROPONINI, MYOGLOBIN in the last 168 hours.  Invalid input(s): CK ------------------------------------------------------------------------------------------------------------------    Component Value Date/Time   BNP 55.7 12/21/2016 1541     ---------------------------------------------------------------------------------------------------------------  Urinalysis    Component Value Date/Time   COLORURINE AMBER (A) 05/10/2014 1449   APPEARANCEUR CLOUDY (A) 05/10/2014 1449   LABSPEC 1.012 05/10/2014 1449   PHURINE 5.5 05/10/2014 1449   GLUCOSEU NEGATIVE 05/10/2014 1449   HGBUR MODERATE (A) 05/10/2014 1449   BILIRUBINUR NEGATIVE 05/10/2014 1449   KETONESUR NEGATIVE 05/10/2014 1449   PROTEINUR 30 (A) 05/10/2014 1449   UROBILINOGEN 0.2 05/10/2014 1449    NITRITE NEGATIVE 05/10/2014 1449   LEUKOCYTESUR MODERATE (A) 05/10/2014 1449    ----------------------------------------------------------------------------------------------------------------   Imaging Results:    Dg Chest 2 View  Result Date: 12/21/2016 CLINICAL DATA:  Shortness of breath for 1 week EXAM: CHEST  2 VIEW COMPARISON:  05/11/2014 FINDINGS: Cardiac shadow is within normal limits. Aortic calcifications are again seen. The lungs  are well aerated bilaterally. Degenerative changes of the thoracic spine are noted. No focal infiltrate is seen. Stable density related to overlapping ribs is noted in the left base. IMPRESSION: No acute abnormality noted. Electronically Signed   By: Inez Catalina M.D.   On: 12/21/2016 15:56   Ct Angio Chest Pe W And/or Wo Contrast  Result Date: 12/22/2016 CLINICAL DATA:  One week of dyspnea. Treated for asthma exacerbation with steroids. EXAM: CT ANGIOGRAPHY CHEST WITH CONTRAST TECHNIQUE: Multidetector CT imaging of the chest was performed using the standard protocol during bolus administration of intravenous contrast. Multiplanar CT image reconstructions and MIPs were obtained to evaluate the vascular anatomy. CONTRAST:  65 cc Isovue 370 IV COMPARISON:  08/16/2011 chest CT, CT abdomen 05/10/2014 FINDINGS: Cardiovascular: No acute pulmonary embolus. Coronary arterial, aortic and great vessel atherosclerosis. Borderline cardiomegaly without pericardial effusion or thickening no aortic aneurysm or dissection is noted. Mediastinum/Nodes: No enlarged mediastinal, hilar, or axillary lymph nodes. Thyroid gland, trachea, and esophagus demonstrate no significant findings. Lungs/Pleura: No pneumothorax or pleural effusion. There is dependent atelectasis focal at the right lung base. Tiny 4 mm left lower lobe pulmonary nodule, stable since 2016 and consistent with a benign finding. Upper Abdomen: Fatty atrophy of the pancreas. Small hiatal hernia. No acute intra-abdominal  abnormality. No adrenal mass. No splenomegaly. Slightly lobular appearance of the right upper pole of the kidney, incompletely visualized but stable relative to 2016. Musculoskeletal: Mild degenerative change again noted of the included dorsal spine. Review of the MIP images confirms the above findings. IMPRESSION: 1. No acute pulmonary embolus.  No acute pulmonary disease. 2. Coronary arteriosclerosis and aortic atherosclerosis. 3. Stable 4 mm left lower lobe pulmonary nodule dating back to 2016 consistent with a benign finding. Aortic Atherosclerosis (ICD10-I70.0). Electronically Signed   By: Ashley Royalty M.D.   On: 12/22/2016 00:22   NSR at 95, nl axis, q in v1, v2   Assessment & Plan:    Active Problems:   Hypoxia   Dyspnea    Dyspnea with exertion Tele Trop I in am Check cardiac echo Check nuclear stress test r/o ischemic heart disease.   Asthma Cont advair  Hypertension uncontrolled Cont present medications Hydralazine 10mg  iv q6h prn   Dispo: if cardiac w/up negative, can discharge to home with outpatient PFT with lung volume and DLCO  DVT Prophylaxis Lovenox - Scd  AM Labs Ordered, also please review Full Orders  Family Communication: Admission, patients condition and plan of care including tests being ordered have been discussed with the patient who indicate understanding and agree with the plan and Code Status.  Code Status FULL CODE  Likely DC to  home  Condition GUARDED    Consults called: none  Admission status: obs   Time spent in minutes : 45   Jani Gravel M.D on 12/22/2016 at 1:36 AM  Between 7am to 7pm - Pager - [phone]. After 7pm go to [email or web address] - password [facility]  [facility] - Office  [phone]

## 2016-12-22 NOTE — Progress Notes (Signed)
Received report from RN in ED Janett Billow who stated she wouldn't give pt her iv lasix. Will await pt's arrival to unit

## 2016-12-22 NOTE — ED Notes (Signed)
Dr. Maudie Mercury in to assess pt for admission

## 2016-12-22 NOTE — Progress Notes (Signed)
PROGRESS NOTE    Joanne Holmes  UUV:[id number] DOB: Feb 01, 1946 DOA: 12/21/2016 PCP: [name], PA-C   Specialists:   Dr. Terrence Dupont    Brief Narrative:  71 year old female Asthma as a child Recent visits to the PCP office over the past 2 weeks-given multiple rounds of steroids for what was presumed to be asthma Did well initially but usually is able to ambulate around the house and do relatively heavy housework and has been feeling exhausted with minimal exertion and positional changes   Came to the emergency room and because of the symptoms CT chest was performed showing no acute pulmonary embolus and a 4 mm nodule-CT also concerning for cardiomegaly She also had some significant hypertension urgency Pulse ox was 86% with ambulation  I spoke to Dr. Terrence Dupont and he read part of the stress test this morning Given her symptoms she was admitted for probable acute on chronic diastolic heart failure   Assessment & Plan:   Active Problems:   Hypoxia   DOE (dyspnea on exertion)   Hypertensive urgency Probable decompensated diastolic heart failure-Has cardiomegaly on CT chest NYHA class 2-3 symptoms--usual functional status able to clean the house for the whole day  We have diuresed her with Lasix IV 40 daily and may increase to 40 twice a day a.m.  We will allow her diet as her stress test was low risk  Nuclear stress test EF 59%  Cardiology is seen and added amlodipine 10, Ava Pro 150, HCTZ 25--been aspirin has been discontinued  We'll discontinue Mobic as althoughmore selective Cox inhibitor may still contribute  Body mass index is 32 kg/m.  Moderate obesity, will need outpatient follow-up for weight loss, and potential restrictive surgery  May be able to do well with guided weight loss  History of asthma Pulmonary nodule  Probable restrictive deficit secondary to obesity  Stable pulmonary nodules since 2016  would follow up with PFTs as an outpatient  For  now continue Advair1 puff twice a day  Do not hear any wheezing, would not give steroids and does not seem to need beta 2 agonist at this time  Okay to continue Singulair 10 at bedtime  Reflux  Continue pantoprazole 40 at bedtime  Female Climacteric  Cautious continuation of HRT--needs outpatient screening as per [place] PTF guidelines   Lovenox Long discussion with family at bedside Inpatient ending echocardiogram and diuresis   Consultants:   ardiology  Procedures:   Stress test  Antimicrobials:   none    Subjective: Awake alert still somewhat short of breath Seen in emergency room and seems somewhat improved Describes shortness of breath on laying flat and on walking accustomed distances Has had no fever or sputum or other issues   Objective: Vitals:   12/22/16 1353 12/22/16 1400 12/22/16 1415 12/22/16 1543  BP: (!) 182/69 (!) 167/70 (!) 148/61 (!) 149/51  Pulse: (!) 103 98 97 99  Resp:    17  Temp:    98 F (36.7 C)  TempSrc:    Oral  SpO2: 96% 96% 95% 92%  Weight:    84.6 kg (186 lb 6.4 oz)  Height:    5\' 4"  (1.626 m)    Intake/Output Summary (Last 24 hours) at 12/22/16 1549 Last data filed at 12/22/16 0205  Gross per 24 hour  Intake             1000 ml  Output  0 ml  Net             1000 ml   Filed Weights   12/21/16 1527 12/22/16 1543  Weight: 86.2 kg (190 lb) 84.6 kg (186 lb 6.4 oz)    Examination:  Awake alert and oriented no distress S1-S2holosystolic murmur left ler sternal edge--mild JVD Abdomen obese nontender no rebound No lower extremity edema Chest clear without added sound Neurologically intact moves all 4 limbs equally No rash   Data Reviewed: I have personally reviewed following labs and imaging studies  CBC:  Recent Labs Lab 12/21/16 1541 12/22/16 0432  WBC 6.9 7.8  NEUTROABS 4.2  --   HGB 12.1 11.5*  HCT 35.6* 34.4*  MCV 86.2 86.0  PLT 240 563   Basic Metabolic Panel:  Recent Labs Lab  12/21/16 1541 12/22/16 0432  NA 134* 136  K 3.9 3.4*  CL 100* 105  CO2 22 20*  GLUCOSE 112* 105*  BUN 19 10  CREATININE 1.32* 0.90  CALCIUM 9.4 9.1   GFR: Estimated Creatinine Clearance: 58.6 mL/min (by C-G formula based on SCr of 0.9 mg/dL). Liver Function Tests:  Recent Labs Lab 12/21/16 1541 12/22/16 0432  AST 24 23  ALT 24 23  ALKPHOS 86 76  BILITOT 0.6 0.9  PROT 7.0 6.3*  ALBUMIN 4.2 3.6   No results for input(s): LIPASE, AMYLASE in the last 168 hours. No results for input(s): AMMONIA in the last 168 hours. Coagulation Profile: No results for input(s): INR, PROTIME in the last 168 hours. Cardiac Enzymes:  Recent Labs Lab 12/22/16 0432  TROPONINI <0.03   BNP (last 3 results) No results for input(s): PROBNP in the last 8760 hours. HbA1C: No results for input(s): HGBA1C in the last 72 hours. CBG: No results for input(s): GLUCAP in the last 168 hours. Lipid Profile: No results for input(s): CHOL, HDL, LDLCALC, TRIG, CHOLHDL, LDLDIRECT in the last 72 hours. Thyroid Function Tests:  Recent Labs  12/22/16 0432  TSH 0.683   Anemia Panel: No results for input(s): VITAMINB12, FOLATE, FERRITIN, TIBC, IRON, RETICCTPCT in the last 72 hours. Urine analysis:    Component Value Date/Time   COLORURINE AMBER (A) 05/10/2014 1449   APPEARANCEUR CLOUDY (A) 05/10/2014 1449   LABSPEC 1.012 05/10/2014 1449   PHURINE 5.5 05/10/2014 1449   GLUCOSEU NEGATIVE 05/10/2014 1449   HGBUR MODERATE (A) 05/10/2014 1449   BILIRUBINUR NEGATIVE 05/10/2014 1449   KETONESUR NEGATIVE 05/10/2014 1449   PROTEINUR 30 (A) 05/10/2014 1449   UROBILINOGEN 0.2 05/10/2014 1449   NITRITE NEGATIVE 05/10/2014 1449   LEUKOCYTESUR MODERATE (A) 05/10/2014 1449     Radiology Studies: Reviewed images personally in health database    Scheduled Meds: . albuterol  5 mg Nebulization Once  . amLODipine  10 mg Oral Daily  . atorvastatin  40 mg Oral QHS  . cholecalciferol  2,000 Units Oral QHS   . enoxaparin (LOVENOX) injection  40 mg Subcutaneous Daily  . furosemide  40 mg Intravenous Once  . hydrochlorothiazide  25 mg Oral Daily  . irbesartan  150 mg Oral Daily  . loratadine  10 mg Oral Daily  . meloxicam  15 mg Oral Q breakfast  . mometasone-formoterol  2 puff Inhalation BID  . montelukast  10 mg Oral QHS  . pantoprazole  40 mg Oral QHS  . regadenoson      . traMADol  50 mg Oral TID   Continuous Infusions:   LOS: 0 days    Time spent: 35  Verneita Griffes, MD [phone]   If 7PM-7AM, please contact night-coverage [email or web address] Password [facility] 12/22/2016, 3:49 PM

## 2016-12-23 ENCOUNTER — Observation Stay (HOSPITAL_COMMUNITY): Payer: Medicare HMO

## 2016-12-23 DIAGNOSIS — R0609 Other forms of dyspnea: Secondary | ICD-10-CM | POA: Diagnosis not present

## 2016-12-23 LAB — ECHOCARDIOGRAM COMPLETE
CHL CUP DOP CALC LVOT VTI: 19.6 cm
CHL CUP TV REG PEAK VELOCITY: 349 cm/s
E decel time: 172 msec
FS: 27 % — AB (ref 28–44)
HEIGHTINCHES: 64 in
IVS/LV PW RATIO, ED: 1
LA ID, A-P, ES: 32 mm
LA vol index: 21.9 mL/m2
LA vol: 41.9 mL
LADIAMINDEX: 1.68 cm/m2
LAVOLA4C: 42.3 mL
LEFT ATRIUM END SYS DIAM: 32 mm
LV PW d: 11 mm — AB (ref 0.6–1.1)
LV TDI E'LATERAL: 5.77
LV TDI E'MEDIAL: 6.42
LVELAT: 5.77 cm/s
LVOT area: 3.8 cm2
LVOT peak grad rest: 6 mmHg
LVOT peak vel: 119 cm/s
LVOTD: 22 mm
LVOTSV: 74 mL
MV Dec: 172
MVPKEVEL: 0.7 m/s
RV LATERAL S' VELOCITY: 10.2 cm/s
RV TAPSE: 18.3 mm
RV sys press: 52 mmHg
TRMAXVEL: 349 cm/s
WEIGHTICAEL: 3016 [oz_av]

## 2016-12-23 MED ORDER — POTASSIUM CHLORIDE ER 10 MEQ PO TBCR: 20.0000 meq | EXTENDED_RELEASE_TABLET | Freq: Every day | ORAL | 0 refills | Status: DC

## 2016-12-23 MED ORDER — IRBESARTAN 150 MG PO TABS
150.0000 mg | ORAL_TABLET | Freq: Every day | ORAL | 0 refills | Status: DC
Start: 2016-12-24 — End: 2021-10-15

## 2016-12-23 MED ORDER — FUROSEMIDE 40 MG PO TABS: 40.0000 mg | ORAL_TABLET | Freq: Every day | ORAL | 11 refills | Status: DC

## 2016-12-23 NOTE — Progress Notes (Addendum)
Subjective:  Shortness of breath significantly better  Objective:  Vital Signs in the last 24 hours: BP (!) 145/63 (BP Location: Left Arm)   Pulse 86   Temp 98.1 F (36.7 C) (Oral)   Resp 20   Ht 5\' 4"  (1.626 m)   Wt 85.5 kg (188 lb 8 oz)   SpO2 95%   BMI 32.36 kg/m   Physical Exam: Mildly obese white female in no acute distress Lungs:  Clear  Cardiac:  Regular rhythm, normal S1 and S2, no S3 Extremities:  No edema present  Intake/Output from previous day: 09/28 0701 - 09/29 0700 In: 60 [P.O.:60] Out: 500 [Urine:500] Weight Filed Weights   12/21/16 1527 12/22/16 1543 12/23/16 0539  Weight: 86.2 kg (190 lb) 84.6 kg (186 lb 6.4 oz) 85.5 kg (188 lb 8 oz)    Lab Results: Basic Metabolic Panel:  Recent Labs  12/21/16 1541 12/22/16 0432  NA 134* 136  K 3.9 3.4*  CL 100* 105  CO2 22 20*  GLUCOSE 112* 105*  BUN 19 10  CREATININE 1.32* 0.90    CBC:  Recent Labs  12/21/16 1541 12/22/16 0432  WBC 6.9 7.8  NEUTROABS 4.2  --   HGB 12.1 11.5*  HCT 35.6* 34.4*  MCV 86.2 86.0  PLT 240 226    BNP    Component Value Date/Time   BNP 55.7 12/21/2016 1541    Cardiac Panel (last 3 results)  Recent Labs  12/22/16 0432  TROPONINI <0.03   Telemetry: Sinus rhythm  Assessment/Plan: 1.  Dyspnea on exertion with prior asthma and COPD 2.  CAD and aortic atherosclerosis is measured by coronary calcifications on CT scan with negative myocardial perfusion scan 3. Pulmonary hypertension by ECHO  Recommendations:  her BNP level was low.No evidence of PE by CT scan. Does have pulmonary hypertension and should have followup with a pulmonary MD as outpatient.  Need to be sure she is not hypoxemic and also should have a sleep apnea evaluation.     Kerry Hough  MD [facility]  12/23/2016, 12:27 PM

## 2016-12-23 NOTE — Discharge Summary (Signed)
Physician Discharge Summary  Joanne Holmes:[id number] DOB: 1945/10/07 DOA: 12/21/2016  PCP: [name], PA-C  Admit date: 12/21/2016 Discharge date: 12/23/2016  Admitted From: to home Disposition:home  Recommendations for Outpatient Follow-up:  1. Follow up with PCP in 1-2 weeks 2. Please obtain BMP/CBC in one week 3. To follow up with pulmonary/obtain a sleep study  Home Health:none Equipment/Devices:one  Discharge Conditiontable :CODE STATUSfull Diet recommendation:low-salt  Brief/Interim Summary:71 y.o. female, w Asthma (6 flares this past year),  Started on late Monday,  Appears to be exertional.  This morning took a breathing treatment and it helped.  Pt denies fever, chills, cough, cp, palp, n/v, diarrhea, brbpr, black stool.  Pt presented to ED for evaluation of dyspnea.   In ED.  CTA chest=>  IMPRESSION: 1. No acute pulmonary embolus. No acute pulmonary disease. 2. Coronary arteriosclerosis and aortic atherosclerosis. 3. Stable 4 mm left lower lobe pulmonary nodule dating back to 2016 consistent with a benign finding.  Pox 86% on ambulation. Pt will be admitted for dyspnea with exertion, r/o ischemic heart disease.    Discharge Diagnoses:  Active Problems:   Hypoxia   DOE (dyspnea on exertion)  Acute decompensated diastolic heart failure with ejection fraction 59%patient responded to diuresis. DC hydrochlorothiazide.discharge patient home on Lasix 40 mg daily. Low salt diet. Echocardiogram shows pulmonary hypertension patient to follow-up with the pulmonologist up after discharge. Patient should also have evaluation for sleep apnea in order to see what is causing her pulmonary hypertension. There was no evidence of pulmonary embolism  Discharge Instructions follow up with pulmonologist   Allergies as of 12/23/2016      Reactions   Ceclor [cefaclor] Other (See Comments)   Pt does not remember   Cefzil [cefprozil] Other (See Comments)   Pt does  not remember   Erythromycin Itching, Rash   Griseofulvin Swelling   Morphine And Related Itching   Penicillins Anaphylaxis   Has patient had a PCN reaction causing immediate rash, facial/tongue/throat swelling, SOB or lightheadedness with hypotension: Yes Has patient had a PCN reaction causing severe rash involving mucus membranes or skin necrosis: No Has patient had a PCN reaction that required hospitalization: No Has patient had a PCN reaction occurring within the last 10 years: No If all of the above answers are "NO", then may proceed with Cephalosporin use.   Morpholine Salicylate       Medication List    STOP taking these medications   ciprofloxacin 500 MG tablet Commonly known as:  CIPRO   hydrochlorothiazide 25 MG tablet Commonly known as:  HYDRODIURIL     TAKE these medications   albuterol 108 (90 Base) MCG/ACT inhaler Commonly known as:  PROVENTIL HFA;VENTOLIN HFA Inhale 2 puffs into the lungs daily as needed for wheezing or shortness of breath.   albuterol (2.5 MG/3ML) 0.083% nebulizer solution Commonly known as:  PROVENTIL Inhale 3 mLs into the lungs every 4 (four) hours as needed.   amLODipine 10 MG tablet Commonly known as:  NORVASC Take 10 mg by mouth daily.   atorvastatin 40 MG tablet Commonly known as:  LIPITOR Take 40 mg by mouth at bedtime.   benazepril 40 MG tablet Commonly known as:  LOTENSIN Take 40 mg by mouth 2 (two) times daily.   CLARITIN 10 MG tablet Generic drug:  loratadine Take 10 mg by mouth daily.   estradiol 0.0375 MG/24HR Commonly known as:  VIVELLE-DOT Place 1 patch onto the skin once a week. Replace patch on Sunday  FISH OIL PO Take 400 mg by mouth daily.   Flaxseed Oil 1030 MG Caps Take 2 capsules by mouth daily. 1400 MG   Fluticasone-Salmeterol 250-50 MCG/DOSE Aepb Commonly known as:  ADVAIR DISKUS Inhale 1 puff into the lungs 2 (two) times daily. What changed:  how much to take  when to take this  reasons to  take this   ADVAIR DISKUS 500-50 MCG/DOSE Aepb Generic drug:  Fluticasone-Salmeterol Inhale into the lungs. What changed:  Another medication with the same name was changed. Make sure you understand how and when to take each.   HYDROcodone-acetaminophen 5-325 MG tablet Commonly known as:  NORCO/VICODIN Take 1 tablet by mouth every 4 (four) hours as needed for moderate pain or severe pain.   irbesartan 150 MG tablet Commonly known as:  AVAPRO Take 1 tablet (150 mg total) by mouth daily.   meloxicam 15 MG tablet Commonly known as:  MOBIC Take 15 mg by mouth daily.   montelukast 10 MG tablet Commonly known as:  SINGULAIR Take 10 mg by mouth at bedtime.   pantoprazole 40 MG tablet Commonly known as:  PROTONIX Take 40 mg by mouth at bedtime.   traMADol 50 MG tablet Commonly known as:  ULTRAM Take 50 mg by mouth 3 (three) times daily. pain   Vitamin D3 2000 units capsule Take 2,000 Units by mouth at bedtime.            Discharge Care Instructions        Start     Ordered   12/24/16 0000  irbesartan (AVAPRO) 150 MG tablet  Daily     12/23/16 1604      Allergies  Allergen Reactions  . Ceclor [Cefaclor] Other (See Comments)    Pt does not remember  . Cefzil [Cefprozil] Other (See Comments)    Pt does not remember  . Erythromycin Itching and Rash  . Griseofulvin Swelling  . Morphine And Related Itching  . Penicillins Anaphylaxis    Has patient had a PCN reaction causing immediate rash, facial/tongue/throat swelling, SOB or lightheadedness with hypotension: Yes Has patient had a PCN reaction causing severe rash involving mucus membranes or skin necrosis: No Has patient had a PCN reaction that required hospitalization: No Has patient had a PCN reaction occurring within the last 10 years: No If all of the above answers are "NO", then may proceed with Cephalosporin use.  Marland Kitchen Morpholine Salicylate     Consultations:   Procedures/Studies: Dg Chest 2 View  Result  Date: 12/21/2016 CLINICAL DATA:  Shortness of breath for 1 week EXAM: CHEST  2 VIEW COMPARISON:  05/11/2014 FINDINGS: Cardiac shadow is within normal limits. Aortic calcifications are again seen. The lungs are well aerated bilaterally. Degenerative changes of the thoracic spine are noted. No focal infiltrate is seen. Stable density related to overlapping ribs is noted in the left base. IMPRESSION: No acute abnormality noted. Electronically Signed   By: Inez Catalina M.D.   On: 12/21/2016 15:56   Ct Angio Chest Pe W And/or Wo Contrast  Result Date: 12/22/2016 CLINICAL DATA:  One week of dyspnea. Treated for asthma exacerbation with steroids. EXAM: CT ANGIOGRAPHY CHEST WITH CONTRAST TECHNIQUE: Multidetector CT imaging of the chest was performed using the standard protocol during bolus administration of intravenous contrast. Multiplanar CT image reconstructions and MIPs were obtained to evaluate the vascular anatomy. CONTRAST:  65 cc Isovue 370 IV COMPARISON:  08/16/2011 chest CT, CT abdomen 05/10/2014 FINDINGS: Cardiovascular: No acute pulmonary embolus. Coronary arterial, aortic  and great vessel atherosclerosis. Borderline cardiomegaly without pericardial effusion or thickening no aortic aneurysm or dissection is noted. Mediastinum/Nodes: No enlarged mediastinal, hilar, or axillary lymph nodes. Thyroid gland, trachea, and esophagus demonstrate no significant findings. Lungs/Pleura: No pneumothorax or pleural effusion. There is dependent atelectasis focal at the right lung base. Tiny 4 mm left lower lobe pulmonary nodule, stable since 2016 and consistent with a benign finding. Upper Abdomen: Fatty atrophy of the pancreas. Small hiatal hernia. No acute intra-abdominal abnormality. No adrenal mass. No splenomegaly. Slightly lobular appearance of the right upper pole of the kidney, incompletely visualized but stable relative to 2016. Musculoskeletal: Mild degenerative change again noted of the included dorsal spine.  Review of the MIP images confirms the above findings. IMPRESSION: 1. No acute pulmonary embolus.  No acute pulmonary disease. 2. Coronary arteriosclerosis and aortic atherosclerosis. 3. Stable 4 mm left lower lobe pulmonary nodule dating back to 2016 consistent with a benign finding. Aortic Atherosclerosis (ICD10-I70.0). Electronically Signed   By: Ashley Royalty M.D.   On: 12/22/2016 00:22   Nm Myocar Multi W/spect W/wall Motion / Ef  Result Date: 12/22/2016 CLINICAL DATA:  hypertension. Shortness of breath. Asthma/COPD. Ex-smoker. EXAM: MYOCARDIAL IMAGING WITH SPECT (REST AND PHARMACOLOGIC-STRESS) GATED LEFT VENTRICULAR WALL MOTION STUDY LEFT VENTRICULAR EJECTION FRACTION TECHNIQUE: Standard myocardial SPECT imaging was performed after resting intravenous injection of 10 mCi Tc-40m tetrofosmin. Subsequently, intravenous infusion of Lexiscan was performed under the supervision of the Cardiology staff. At peak effect of the drug, 30 mCi Tc-20m tetrofosmin was injected intravenously and standard myocardial SPECT imaging was performed. Quantitative gated imaging was also performed to evaluate left ventricular wall motion, and estimate left ventricular ejection fraction. COMPARISON:  Chest CT of 12/21/2016. FINDINGS: Perfusion: On rest, there is mildly decreased inferior wall activity which may represent diaphragmatic attenuation. No reversibility to suggest inducible ischemia. Wall Motion: Possible mild hypokinesis involving the septum. Left Ventricular Ejection Fraction: 59 % End diastolic volume 85 ml End systolic volume 35 ml IMPRESSION: 1. No reversible ischemia or infarction. 2. Possible mild septal ischemia. 3. Left ventricular ejection fraction 59% 4. Non invasive risk stratification*: Low *2012 Appropriate Use Criteria for Coronary Revascularization Focused Update: J Am Coll Cardiol. 2956;21(3):086-578. [email or web address] Electronically Signed   By: Abigail Miyamoto M.D.    On: 12/22/2016 12:31    (Echo, Carotid, EGD, Colonoscopy, ERCP)    Subjective:   Discharge Exam: Vitals:   12/23/16 0922 12/23/16 1512  BP:  (!) 135/56  Pulse:  87  Resp:  17  Temp:  97.6 F (36.4 C)  SpO2: 95% 95%   Vitals:   12/22/16 2132 12/23/16 0539 12/23/16 0922 12/23/16 1512  BP: (!) 148/63 (!) 145/63  (!) 135/56  Pulse: 99 86  87  Resp: 20 20  17   Temp: 98.3 F (36.8 C) 98.1 F (36.7 C)  97.6 F (36.4 C)  TempSrc: Oral Oral  Oral  SpO2: 94% 95% 95% 95%  Weight:  85.5 kg (188 lb 8 oz)    Height:        General: Pt is alert, awake, not in acute distress Cardiovascular: RRR, S1/S2 +, no rubs, no gallops Respiratory: CTA bilaterally, no wheezing, no rhonchi Abdominal: Soft, NT, ND, bowel sounds + Extremities: no edema, no cyanosis    The results of significant diagnostics from this hospitalization (including imaging, microbiology, ancillary and laboratory) are listed below for reference.     Microbiology: No results found for this or any previous visit (from the past 240 hour(s)).  Labs: BNP (last 3 results)  Recent Labs  12/21/16 1541  BNP 17.0   Basic Metabolic Panel:  Recent Labs Lab 12/21/16 1541 12/22/16 0432  NA 134* 136  K 3.9 3.4*  CL 100* 105  CO2 22 20*  GLUCOSE 112* 105*  BUN 19 10  CREATININE 1.32* 0.90  CALCIUM 9.4 9.1   Liver Function Tests:  Recent Labs Lab 12/21/16 1541 12/22/16 0432  AST 24 23  ALT 24 23  ALKPHOS 86 76  BILITOT 0.6 0.9  PROT 7.0 6.3*  ALBUMIN 4.2 3.6   No results for input(s): LIPASE, AMYLASE in the last 168 hours. No results for input(s): AMMONIA in the last 168 hours. CBC:  Recent Labs Lab 12/21/16 1541 12/22/16 0432  WBC 6.9 7.8  NEUTROABS 4.2  --   HGB 12.1 11.5*  HCT 35.6* 34.4*  MCV 86.2 86.0  PLT 240 226   Cardiac Enzymes:  Recent Labs Lab 12/22/16 0432  TROPONINI <0.03   BNP: Invalid input(s): POCBNP CBG: No results for input(s): GLUCAP in the last 168  hours. D-Dimer  Recent Labs  12/21/16 2039  DDIMER 3.29*   Hgb A1c No results for input(s): HGBA1C in the last 72 hours. Lipid Profile No results for input(s): CHOL, HDL, LDLCALC, TRIG, CHOLHDL, LDLDIRECT in the last 72 hours. Thyroid function studies  Recent Labs  12/22/16 0432  TSH 0.683   Anemia work up No results for input(s): VITAMINB12, FOLATE, FERRITIN, TIBC, IRON, RETICCTPCT in the last 72 hours. Urinalysis    Component Value Date/Time   COLORURINE AMBER (A) 05/10/2014 1449   APPEARANCEUR CLOUDY (A) 05/10/2014 1449   LABSPEC 1.012 05/10/2014 1449   PHURINE 5.5 05/10/2014 1449   GLUCOSEU NEGATIVE 05/10/2014 1449   HGBUR MODERATE (A) 05/10/2014 1449   BILIRUBINUR NEGATIVE 05/10/2014 1449   KETONESUR NEGATIVE 05/10/2014 1449   PROTEINUR 30 (A) 05/10/2014 1449   UROBILINOGEN 0.2 05/10/2014 1449   NITRITE NEGATIVE 05/10/2014 1449   LEUKOCYTESUR MODERATE (A) 05/10/2014 1449   Sepsis Labs Invalid input(s): PROCALCITONIN,  WBC,  LACTICIDVEN Microbiology No results found for this or any previous visit (from the past 240 hour(s)).   Time coordinating discharge: Over 30 minutes  SIGNED:   Georgette Shell, MD  [facility] 12/23/2016, 4:07 PM  If 7PM-7AM, please contact night-coverage [email or web address] Password [email or web address]

## 2016-12-23 NOTE — Progress Notes (Signed)
  Echocardiogram 2D Echocardiogram has been performed.  Joanne Holmes 12/23/2016, 3:17 PM

## 2016-12-23 NOTE — Progress Notes (Signed)
Nsg Discharge Note  Admit Date:  12/21/2016 Discharge date: 12/23/2016   Joanne Holmes to be D/C'd Home per MD order.  AVS completed.  Copy for chart, and copy for patient signed, and dated. Patient/caregiver able to verbalize understanding.  Discharge Medication: Allergies as of 12/23/2016      Reactions   Ceclor [cefaclor] Other (See Comments)   Pt does not remember   Cefzil [cefprozil] Other (See Comments)   Pt does not remember   Erythromycin Itching, Rash   Griseofulvin Swelling   Morphine And Related Itching   Penicillins Anaphylaxis   Has patient had a PCN reaction causing immediate rash, facial/tongue/throat swelling, SOB or lightheadedness with hypotension: Yes Has patient had a PCN reaction causing severe rash involving mucus membranes or skin necrosis: No Has patient had a PCN reaction that required hospitalization: No Has patient had a PCN reaction occurring within the last 10 years: No If all of the above answers are "NO", then may proceed with Cephalosporin use.   Morpholine Salicylate       Medication List    STOP taking these medications   ciprofloxacin 500 MG tablet Commonly known as:  CIPRO   hydrochlorothiazide 25 MG tablet Commonly known as:  HYDRODIURIL     TAKE these medications   albuterol 108 (90 Base) MCG/ACT inhaler Commonly known as:  PROVENTIL HFA;VENTOLIN HFA Inhale 2 puffs into the lungs daily as needed for wheezing or shortness of breath.   albuterol (2.5 MG/3ML) 0.083% nebulizer solution Commonly known as:  PROVENTIL Inhale 3 mLs into the lungs every 4 (four) hours as needed.   amLODipine 10 MG tablet Commonly known as:  NORVASC Take 10 mg by mouth daily.   atorvastatin 40 MG tablet Commonly known as:  LIPITOR Take 40 mg by mouth at bedtime.   benazepril 40 MG tablet Commonly known as:  LOTENSIN Take 40 mg by mouth 2 (two) times daily.   CLARITIN 10 MG tablet Generic drug:  loratadine Take 10 mg by mouth daily.   estradiol  0.0375 MG/24HR Commonly known as:  VIVELLE-DOT Place 1 patch onto the skin once a week. Replace patch on Sunday   FISH OIL PO Take 400 mg by mouth daily.   Flaxseed Oil 1030 MG Caps Take 2 capsules by mouth daily. 1400 MG   Fluticasone-Salmeterol 250-50 MCG/DOSE Aepb Commonly known as:  ADVAIR DISKUS Inhale 1 puff into the lungs 2 (two) times daily. What changed:  how much to take  when to take this  reasons to take this   ADVAIR DISKUS 500-50 MCG/DOSE Aepb Generic drug:  Fluticasone-Salmeterol Inhale into the lungs. What changed:  Another medication with the same name was changed. Make sure you understand how and when to take each.   furosemide 40 MG tablet Commonly known as:  LASIX Take 1 tablet (40 mg total) by mouth daily.   HYDROcodone-acetaminophen 5-325 MG tablet Commonly known as:  NORCO/VICODIN Take 1 tablet by mouth every 4 (four) hours as needed for moderate pain or severe pain.   irbesartan 150 MG tablet Commonly known as:  AVAPRO Take 1 tablet (150 mg total) by mouth daily.   meloxicam 15 MG tablet Commonly known as:  MOBIC Take 15 mg by mouth daily.   montelukast 10 MG tablet Commonly known as:  SINGULAIR Take 10 mg by mouth at bedtime.   pantoprazole 40 MG tablet Commonly known as:  PROTONIX Take 40 mg by mouth at bedtime.   potassium chloride 10 MEQ tablet Commonly  known as:  K-DUR Take 2 tablets (20 mEq total) by mouth daily.   traMADol 50 MG tablet Commonly known as:  ULTRAM Take 50 mg by mouth 3 (three) times daily. pain   Vitamin D3 2000 units capsule Take 2,000 Units by mouth at bedtime.            Discharge Care Instructions        Start     Ordered   12/24/16 0000  irbesartan (AVAPRO) 150 MG tablet  Daily     12/23/16 1604   12/23/16 0000  furosemide (LASIX) 40 MG tablet  Daily     12/23/16 1616   12/23/16 0000  potassium chloride (K-DUR) 10 MEQ tablet  Daily     12/23/16 1616      Discharge Assessment: Vitals:    12/23/16 0922 12/23/16 1512  BP:  (!) 135/56  Pulse:  87  Resp:  17  Temp:  97.6 F (36.4 C)  SpO2: 95% 95%   Skin clean, dry and intact without evidence of skin break down, no evidence of skin tears noted. IV catheter discontinued intact. Site without signs and symptoms of complications - no redness or edema noted at insertion site, patient denies c/o pain - only slight tenderness at site.  Dressing with slight pressure applied.  D/c Instructions-Education: Discharge instructions given to patient/family with verbalized understanding. D/c education completed with patient/family including follow up instructions, medication list, d/c activities limitations if indicated, with other d/c instructions as indicated by MD - patient able to verbalize understanding, all questions fully answered. Patient instructed to return to ED, call 911, or call MD for any changes in condition.  Patient escorted via [place], and D/C home via private auto.  Salley Slaughter, RN 12/23/2016 4:29 PM

## 2017-02-19 ENCOUNTER — Other Ambulatory Visit: Payer: Self-pay | Admitting: Family Medicine

## 2017-02-19 DIAGNOSIS — Z1231 Encounter for screening mammogram for malignant neoplasm of breast: Secondary | ICD-10-CM

## 2017-03-21 ENCOUNTER — Ambulatory Visit
Admission: RE | Admit: 2017-03-21 | Discharge: 2017-03-21 | Disposition: A | Discharge status: Home / Self Care | Payer: Medicare HMO | Source: Ambulatory Visit | Attending: Family Medicine | Admitting: Family Medicine

## 2017-03-21 DIAGNOSIS — Z1231 Encounter for screening mammogram for malignant neoplasm of breast: Secondary | ICD-10-CM

## 2017-07-02 ENCOUNTER — Telehealth (INDEPENDENT_AMBULATORY_CARE_PROVIDER_SITE_OTHER): Payer: Self-pay | Admitting: Physical Medicine and Rehabilitation

## 2017-07-02 NOTE — Telephone Encounter (Signed)
Yes ok 

## 2017-07-03 NOTE — Telephone Encounter (Signed)
Auth # [id number] Effective 07/18/17-07/18/17 Called pt and left vm #1.

## 2017-07-03 NOTE — Telephone Encounter (Signed)
Needs auth for [phone]- L5-S1 [place]. Patient is aware we will call back to schedule. No blood thinners.

## 2017-07-18 ENCOUNTER — Encounter (INDEPENDENT_AMBULATORY_CARE_PROVIDER_SITE_OTHER): Payer: Self-pay | Admitting: Physical Medicine and Rehabilitation

## 2017-07-18 ENCOUNTER — Ambulatory Visit (INDEPENDENT_AMBULATORY_CARE_PROVIDER_SITE_OTHER): Payer: Medicare HMO | Admitting: Physical Medicine and Rehabilitation

## 2017-07-18 ENCOUNTER — Ambulatory Visit (INDEPENDENT_AMBULATORY_CARE_PROVIDER_SITE_OTHER): Payer: Medicare HMO

## 2017-07-18 VITALS — BP 151/75 | HR 95

## 2017-07-18 DIAGNOSIS — M961 Postlaminectomy syndrome, not elsewhere classified: Secondary | ICD-10-CM | POA: Diagnosis not present

## 2017-07-18 DIAGNOSIS — M48062 Spinal stenosis, lumbar region with neurogenic claudication: Secondary | ICD-10-CM | POA: Diagnosis not present

## 2017-07-18 DIAGNOSIS — M5416 Radiculopathy, lumbar region: Secondary | ICD-10-CM | POA: Diagnosis not present

## 2017-07-18 MED ORDER — METHYLPREDNISOLONE ACETATE 80 MG/ML IJ SUSP
80.0000 mg | Freq: Once | INTRAMUSCULAR | Status: AC
  Administered 2017-07-18: 80 mg

## 2017-07-18 NOTE — Patient Instructions (Signed)

## 2017-07-18 NOTE — Progress Notes (Signed)
 .  Numeric Pain Rating Scale and Functional Assessment Average Pain 7   In the last MONTH (on 0-10 scale) has pain interfered with the following?  1. General activity like being  able to carry out your everyday physical activities such as walking, climbing stairs, carrying groceries, or moving a chair?  Rating(4)   +Driver, -BT, -Dye Allergies.  

## 2017-07-30 ENCOUNTER — Telehealth (INDEPENDENT_AMBULATORY_CARE_PROVIDER_SITE_OTHER): Payer: Self-pay | Admitting: Physical Medicine and Rehabilitation

## 2017-07-31 NOTE — Procedures (Signed)
Lumbar Epidural Steroid Injection - Interlaminar Approach with Fluoroscopic Guidance  Patient: Joanne Holmes      Date of Birth: 12-04-1945 MRN: [id number] PCP: [name], PA-C      Visit Date: 07/18/2017   Universal Protocol:     Consent Given By: the patient  Position: PRONE  Additional Comments: Vital signs were monitored before and after the procedure. Patient was prepped and draped in the usual sterile fashion. The correct patient, procedure, and site was verified.   Injection Procedure Details:  Procedure Site One Meds Administered:  Meds ordered this encounter  Medications  . methylPREDNISolone acetate (DEPO-MEDROL) injection 80 mg     Laterality: Right  Location/Site:  L5-S1  Needle size: 20 G  Needle type: Tuohy  Needle Placement: Paramedian epidural  Findings:   -Comments: Excellent flow of contrast into the epidural space.  Procedure Details: Using a paramedian approach from the side mentioned above, the region overlying the inferior lamina was localized under fluoroscopic visualization and the soft tissues overlying this structure were infiltrated with 4 ml. of 1% Lidocaine without Epinephrine. The Tuohy needle was inserted into the epidural space using a paramedian approach.   The epidural space was localized using loss of resistance along with lateral and bi-planar fluoroscopic views.  After negative aspirate for air, blood, and CSF, a 2 ml. volume of Isovue-250 was injected into the epidural space and the flow of contrast was observed. Radiographs were obtained for documentation purposes.    The injectate was administered into the level noted above.   Additional Comments:  The patient tolerated the procedure well Dressing: Band-Aid    Post-procedure details: Patient was observed during the procedure. Post-procedure instructions were reviewed.  Patient left the clinic in stable condition.

## 2017-07-31 NOTE — Telephone Encounter (Signed)
Patient said the injection helped a lot but she has started feeling the pain again. It is all in her lower back- no leg pain.

## 2017-07-31 NOTE — Telephone Encounter (Signed)
See if they will pre-auth bilateral TF, likely L4, and I can talk to her about RFA at that visit, Send back message to me so I can dictate quick note detailing relief etc before Pre-Auth eviCore

## 2017-07-31 NOTE — Telephone Encounter (Signed)
Not sure of her insurance but would do either TF esi or facet block, see if she needs prior [place]

## 2017-07-31 NOTE — Progress Notes (Signed)
Joanne Holmes - 72 y.o. female MRN [id number]  Date of birth: 10-Jun-1945  Office Visit Note: Visit Date: 07/18/2017 PCP: Joanne Cara, PA-C Referred by: Joanne Holmes, [place], *  Subjective: Chief Complaint  Patient presents with  . Lower Back - Pain   HPI: Joanne Holmes is a very pleasant 72 year old female that I last saw in July.  We completed left interlaminar epidural steroid injection at L5-S1 with decent relief for a little while.  History is a little bit hard to achieve with her mainly because she was seeing Dr. Sherwood Gambler for years and he was doing her injections.  These injections were given a limit of 3/year or 3 per 6 months.  She was told to always wait until she really could not bear it any longer before she had an injection.  We have tried to explain a different pathway of doing injections and what is really consider standard of care among pain physicians.  Nonetheless she returns today with worsening low back pain fairly severe.  She did get some relief with the last injection but again hard to tell for how long and how much.  She does have severe stenosis at L3-4 and L4-5 which is multifactorial.  She has prior left laminectomy at L5-S1 with likely scar tissue on the left S1 nerve root.  Again she has no tingling or numbness or radicular leg pain.  She is maintained by her primary care physician on Norco and Ultram.  She tries to limit those but does use those for functionality.  It does limit what she can do.  She is had no focal weakness or new trauma.  Brief examination shows good distal strength.  We are going to repeat the injection because she did get some relief.  She knows to call us in 2 weeks however if it is just not very beneficial and I tried to really push the fact that she may do better with a different type of injections such as facet joint blocks and radiofrequency ablation or a transforaminal approach at the level of stenosis.  I used to see her husband  quite a bit in the past and that is how she began to see [place].   ROS Otherwise per HPI.  Assessment & Plan: Visit Diagnoses:  1. Lumbar radiculopathy   2. Spinal stenosis of lumbar region with neurogenic claudication   3. Post laminectomy syndrome     Plan: No additional findings.   Meds & Orders:  Meds ordered this encounter  Medications  . methylPREDNISolone acetate (DEPO-MEDROL) injection 80 mg    Orders Placed This Encounter  Procedures  . XR C-ARM NO REPORT  . Epidural Steroid injection    Follow-up: Return if symptoms worsen or fail to improve.   Procedures: No procedures performed  Lumbar Epidural Steroid Injection - Interlaminar Approach with Fluoroscopic Guidance  Patient: Joanne Holmes      Date of Birth: 10/02/1945 MRN: [id number] PCP: Joanne Cara, PA-C      Visit Date: 07/18/2017   Universal Protocol:     Consent Given By: the patient  Position: PRONE  Additional Comments: Vital signs were monitored before and after the procedure. Patient was prepped and draped in the usual sterile fashion. The correct patient, procedure, and site was verified.   Injection Procedure Details:  Procedure Site One Meds Administered:  Meds ordered this encounter  Medications  . methylPREDNISolone acetate (DEPO-MEDROL) injection 80 mg     Laterality: Right  Location/Site:  L5-S1  Needle size: 20 G  Needle type: Tuohy  Needle Placement: Paramedian epidural  Findings:   -Comments: Excellent flow of contrast into the epidural space.  Procedure Details: Using a paramedian approach from the side mentioned above, the region overlying the inferior lamina was localized under fluoroscopic visualization and the soft tissues overlying this structure were infiltrated with 4 ml. of 1% Lidocaine without Epinephrine. The Tuohy needle was inserted into the epidural space using a paramedian approach.   The epidural space was localized using loss of resistance  along with lateral and bi-planar fluoroscopic views.  After negative aspirate for air, blood, and CSF, a 2 ml. volume of Isovue-250 was injected into the epidural space and the flow of contrast was observed. Radiographs were obtained for documentation purposes.    The injectate was administered into the level noted above.   Additional Comments:  The patient tolerated the procedure well Dressing: Band-Aid    Post-procedure details: Patient was observed during the procedure. Post-procedure instructions were reviewed.  Patient left the clinic in stable condition.   Clinical History: Lumbar spine MRI dated 03/01/2016  Alignment: 4 mm anterolisthesis L3-4 with mild progression since 2013. 6.6 mm anterolisthesis L4-5 unchanged. Mild retrolisthesis L1-2.  Vertebrae:  Negative for fracture or mass.  Normal bone marrow  Conus medullaris: Extends to the L1-2 level and appears normal.  Paraspinal and other soft tissues: No retroperitoneal mass or adenopathy. Paraspinous muscles are symmetric with mild atrophy.  Disc levels:  L1-2: Mild retrolisthesis. Disc bulging and mild facet degeneration without significant stenosis.  L2-3:  Mild disc and facet degeneration.  Mild spinal stenosis.  L3-4: Severe spinal stenosis which has progressed in the interval. There is anterior listhesis of L3 on L4. Diffuse disc bulging. Severe facet degeneration has progressed in the interval. There is marked subarticular stenosis bilaterally as well as moderate foraminal encroachment bilaterally.  L4-5: Severe spinal stenosis has progressed in the interval. Severe facet degeneration. Grade 1 anterior listhesis unchanged. Subarticular and foraminal encroachment bilaterally.  L5-S1: Severe disc degeneration with disc space narrowing and endplate spurring. Prior laminectomy on the left. Diffuse endplate scarring is present. There is soft tissue surrounding the left S1 nerve root which is  unchanged most likely due to scar tissue. No recurrent disc protrusion. Mild foraminal narrowing bilaterally due to spurring.   She reports that she quit smoking about 36 years ago. Her smoking use included cigarettes. She has a 40.00 pack-year smoking history. She has never used smokeless tobacco. No results for input(s): HGBA1C, LABURIC in the last 8760 hours.  Objective:  VS:  HT:    WT:   BMI:     BP:(!) 151/75  HR:95bpm  TEMP: ( )  RESP:  Physical Exam  Ortho Exam Imaging: No results found.  Past Medical/Family/Surgical/Social History: Medications & Allergies reviewed per EMR, new medications updated. Patient Active Problem List   Diagnosis Date Noted  . Hypoxia 12/22/2016  . DOE (dyspnea on exertion) 12/22/2016  . Spinal stenosis of lumbar region with neurogenic claudication 04/27/2016  . Post laminectomy syndrome 04/27/2016  . Essential hypertension 05/11/2014  . Persistent vomiting 03/18/2012  . Hypertensive urgency 03/18/2012  . COPD (chronic obstructive pulmonary disease) ([place]) 03/18/2012   Past Medical History:  Diagnosis Date  . Allergy   . Asthma   . COPD (chronic obstructive pulmonary disease) ([place])   . Hypertension   . Kidney stone   . Neuromuscular disorder ([facility])    carpal tunnel- left  . Pneumonia   .  Spinal stenosis    Family History  Problem Relation Age of Onset  . CAD Mother   . Cancer Father        lymphosarcoma  . Breast cancer Neg Hx    Past Surgical History:  Procedure Laterality Date  . ABDOMINAL HYSTERECTOMY    . BACK SURGERY  25+ yrs ago   lumbar laminectomy L5-S1  . BLADDER SUSPENSION  ~10 yrs ago  . CARPAL TUNNEL RELEASE  03/08/2012   Procedure: CARPAL TUNNEL RELEASE;  Surgeon: Cammie Sickle., MD;  Location: [facility];  Service: Orthopedics;  Laterality: Left;  . SALPINGOOPHORECTOMY  ~8 yrs ago  . TONSILLECTOMY     Social History   Occupational History  . Not on file  Tobacco Use  . Smoking status:  Former Smoker    Packs/day: 2.00    Years: 20.00    Pack years: 40.00    Types: Cigarettes    Last attempt to quit: 03/06/1981    Years since quitting: 36.4  . Smokeless tobacco: Never Used  Substance and Sexual Activity  . Alcohol use: No  . Drug use: No  . Sexual activity: Not Currently

## 2017-08-01 NOTE — Telephone Encounter (Signed)
Patient needs auth for bilateral [id number] after note is dictated.

## 2017-08-03 NOTE — Telephone Encounter (Signed)
Mrs. Harries is a very pleasant 72 year old female with history of prior lumbar laminectomy at L5-S1 but continued severe stenosis multifactorial at L4-5.  She had received injections by her neurosurgeon Dr. Sherwood Gambler for many years.  He would usually tell her not to come back again until the pain was really severe.  He would arbitrarily limit her to 3 injections in a year.  We have tried to counteract this activity if we can get her some better relief.  We have completed epidural injections just as he did which were interlaminar injections and at least one last year seem to last quite a while but again she gets lost to follow-up because she really puts things off until she just cannot stand it.  She is getting bilateral radicular type leg pain.  She gets mostly back pain and leg pain.  I think she would benefit from diagnostic and hopefully therapeutic bilateral L4 transforaminal epidural steroid injection.  She has not had this approach done and I feel like it would give her more relief and hopefully longer term relief for the area of stenosis.  She does have [employer] which will require preauthorization.  She did call and tell us the epidural injection did help her just did not last very long.  This was an interlaminar approach.  We will kindly ask for authorization for a diagnostic injection in office if his therapeutically would limit these to the criteria that they have for their insurance policy holders.  This is a chronic long-term condition for her.  She has had recent worsening pain over the last 6 months.  Recent injection did seem to help from an intralaminar approach but not for very long.

## 2017-08-03 NOTE — Telephone Encounter (Signed)
Authorization [id number] Auth Effective Date:05/10/2019Auth End Date:11/01/2017   Called pt and left vm #1

## 2017-08-23 ENCOUNTER — Encounter (INDEPENDENT_AMBULATORY_CARE_PROVIDER_SITE_OTHER): Payer: Self-pay | Admitting: Physical Medicine and Rehabilitation

## 2017-08-23 ENCOUNTER — Ambulatory Visit (INDEPENDENT_AMBULATORY_CARE_PROVIDER_SITE_OTHER): Payer: Medicare HMO | Admitting: Physical Medicine and Rehabilitation

## 2017-08-23 ENCOUNTER — Ambulatory Visit (INDEPENDENT_AMBULATORY_CARE_PROVIDER_SITE_OTHER): Payer: Medicare HMO

## 2017-08-23 ENCOUNTER — Encounter

## 2017-08-23 VITALS — BP 201/85 | HR 85

## 2017-08-23 DIAGNOSIS — M961 Postlaminectomy syndrome, not elsewhere classified: Secondary | ICD-10-CM

## 2017-08-23 DIAGNOSIS — M5416 Radiculopathy, lumbar region: Secondary | ICD-10-CM | POA: Diagnosis not present

## 2017-08-23 DIAGNOSIS — M48062 Spinal stenosis, lumbar region with neurogenic claudication: Secondary | ICD-10-CM

## 2017-08-23 MED ORDER — METHYLPREDNISOLONE ACETATE 80 MG/ML IJ SUSP
80.0000 mg | Freq: Once | INTRAMUSCULAR | Status: AC
Start: 2017-08-23 — End: 2017-08-23
  Administered 2017-08-23: 80 mg

## 2017-08-23 NOTE — Patient Instructions (Signed)

## 2017-08-23 NOTE — Progress Notes (Signed)
Joanne Holmes - 72 y.o. female MRN [id number]  Date of birth: 10/29/1945  Office Visit Note: Visit Date: 08/23/2017 PCP: Delilah Shan, MD Referred by: Belva Bertin, [place], *  Subjective: Chief Complaint  Patient presents with  . Lower Back - Pain   HPI: Joanne Holmes is a 72 year old female with severe multifactorial stenosis at L3-4 and L4-5 and prior laminectomy on the left at L5-S1.  She has received treatment by Dr. Sherwood Gambler for many years and I started seeing her after seeing her husband and the fact that she was having some difficulty having the injections done from an insurance standpoint.  The last epidural injection did give her quite a bit of relief but unfortunately she had a situation where she tripped and did not really fall but caught herself and that afternoon she began having more right-sided low back pain.  Her pain is at the belt line and just above the buttock region.  She initially had a little bit of pain on the left down the leg which is her normal distribution.  She is really having mostly right-sided pain at this point.  We are going to complete epidural injection on the right at L5 and just see if we get some relief.  Alternative approach would be transforaminal injection above this level as well as possible trigger point injection and regrouping with manual treatment.  She has been using meloxicam regularly and she also has tramadol and hydrocodone that she uses intermittently.   ROS Otherwise per HPI.  Assessment & Plan: Visit Diagnoses:  1. Lumbar radiculopathy   2. Spinal stenosis of lumbar region with neurogenic claudication   3. Post laminectomy syndrome     Plan: No additional findings.   Meds & Orders:  Meds ordered this encounter  Medications  . methylPREDNISolone acetate (DEPO-MEDROL) injection 80 mg    Orders Placed This Encounter  Procedures  . XR C-ARM NO REPORT  . Epidural Steroid injection    Follow-up: Return if symptoms worsen or  fail to improve.   Procedures: No procedures performed  Lumbosacral Transforaminal Epidural Steroid Injection - Sub-Pedicular Approach with Fluoroscopic Guidance  Patient: Joanne Holmes      Date of Birth: 06-Oct-1945 MRN: [id number] PCP: Delilah Shan, MD      Visit Date: 08/23/2017   Universal Protocol:    Date/Time: 08/23/2017  Consent Given By: the patient  Position: PRONE  Additional Comments: Vital signs were monitored before and after the procedure. Patient was prepped and draped in the usual sterile fashion. The correct patient, procedure, and site was verified.   Injection Procedure Details:  Procedure Site One Meds Administered:  Meds ordered this encounter  Medications  . methylPREDNISolone acetate (DEPO-MEDROL) injection 80 mg    Laterality: Right  Location/Site:  L5-S1  Needle size: 22 G  Needle type: Spinal  Needle Placement: Transforaminal  Findings:    -Comments: Excellent flow of contrast along the nerve and into the epidural space.  Procedure Details: After squaring off the end-plates to get a true AP view, the C-arm was positioned so that an oblique view of the foramen as noted above was visualized. The target area is just inferior to the "nose of the scotty dog" or sub pedicular. The soft tissues overlying this structure were infiltrated with 2-3 ml. of 1% Lidocaine without Epinephrine.  The spinal needle was inserted toward the target using a "trajectory" view along the fluoroscope beam.  Under AP and lateral visualization, the needle was  advanced so it did not puncture dura and was located close the 6 O'Clock position of the pedical in AP tracterory. Biplanar projections were used to confirm position. Aspiration was confirmed to be negative for CSF and/or blood. A 1-2 ml. volume of Isovue-250 was injected and flow of contrast was noted at each level. Radiographs were obtained for documentation purposes.   After attaining the desired flow  of contrast documented above, a 0.5 to 1.0 ml test dose of 0.25% Marcaine was injected into each respective transforaminal space.  The patient was observed for 90 seconds post injection.  After no sensory deficits were reported, and normal lower extremity motor function was noted,   the above injectate was administered so that equal amounts of the injectate were placed at each foramen (level) into the transforaminal epidural space.   Additional Comments:  The patient tolerated the procedure well Dressing: antiobiotic ointment    Post-procedure details: Patient was observed during the procedure. Post-procedure instructions were reviewed.  Patient left the clinic in stable condition.    Clinical History: Lumbar spine MRI dated 03/01/2016  Alignment: 4 mm anterolisthesis L3-4 with mild progression since 2013. 6.6 mm anterolisthesis L4-5 unchanged. Mild retrolisthesis L1-2.  Vertebrae:  Negative for fracture or mass.  Normal bone marrow  Conus medullaris: Extends to the L1-2 level and appears normal.  Paraspinal and other soft tissues: No retroperitoneal mass or adenopathy. Paraspinous muscles are symmetric with mild atrophy.  Disc levels:  L1-2: Mild retrolisthesis. Disc bulging and mild facet degeneration without significant stenosis.  L2-3:  Mild disc and facet degeneration.  Mild spinal stenosis.  L3-4: Severe spinal stenosis which has progressed in the interval. There is anterior listhesis of L3 on L4. Diffuse disc bulging. Severe facet degeneration has progressed in the interval. There is marked subarticular stenosis bilaterally as well as moderate foraminal encroachment bilaterally.  L4-5: Severe spinal stenosis has progressed in the interval. Severe facet degeneration. Grade 1 anterior listhesis unchanged. Subarticular and foraminal encroachment bilaterally.  L5-S1: Severe disc degeneration with disc space narrowing and endplate spurring. Prior laminectomy  on the left. Diffuse endplate scarring is present. There is soft tissue surrounding the left S1 nerve root which is unchanged most likely due to scar tissue. No recurrent disc protrusion. Mild foraminal narrowing bilaterally due to spurring.   She reports that she quit smoking about 36 years ago. Her smoking use included cigarettes. She has a 40.00 pack-year smoking history. She has never used smokeless tobacco. No results for input(s): HGBA1C, LABURIC in the last 8760 hours.  Objective:  VS:  HT:    WT:   BMI:     BP:(!) 201/85  HR:85bpm  TEMP: ( )  RESP:  Physical Exam  Musculoskeletal:  Patient has good distal strength and ambulates without aid.  She has some pain going from sit to stand.  She has pain over the right side of the lower back with positive trigger points.    Ortho Exam Imaging: Xr C-arm No Report  Result Date: 08/23/2017 Please see Notes or Procedures tab for imaging impression.   Past Medical/Family/Surgical/Social History: Medications & Allergies reviewed per EMR, new medications updated. Patient Active Problem List   Diagnosis Date Noted  . Hypoxia 12/22/2016  . DOE (dyspnea on exertion) 12/22/2016  . Spinal stenosis of lumbar region with neurogenic claudication 04/27/2016  . Post laminectomy syndrome 04/27/2016  . Essential hypertension 05/11/2014  . Persistent vomiting 03/18/2012  . Hypertensive urgency 03/18/2012  . COPD (chronic obstructive pulmonary disease) ([place])  03/18/2012   Past Medical History:  Diagnosis Date  . Allergy   . Asthma   . COPD (chronic obstructive pulmonary disease) ([place])   . Hypertension   . Kidney stone   . Neuromuscular disorder ([facility])    carpal tunnel- left  . Pneumonia   . Spinal stenosis    Family History  Problem Relation Age of Onset  . CAD Mother   . Cancer Father        lymphosarcoma  . Breast cancer Neg Hx    Past Surgical History:  Procedure Laterality Date  . ABDOMINAL HYSTERECTOMY    . BACK SURGERY   25+ yrs ago   lumbar laminectomy L5-S1  . BLADDER SUSPENSION  ~10 yrs ago  . CARPAL TUNNEL RELEASE  03/08/2012   Procedure: CARPAL TUNNEL RELEASE;  Surgeon: Cammie Sickle., MD;  Location: [facility];  Service: Orthopedics;  Laterality: Left;  . SALPINGOOPHORECTOMY  ~8 yrs ago  . TONSILLECTOMY     Social History   Occupational History  . Not on file  Tobacco Use  . Smoking status: Former Smoker    Packs/day: 2.00    Years: 20.00    Pack years: 40.00    Types: Cigarettes    Last attempt to quit: 03/06/1981    Years since quitting: 36.4  . Smokeless tobacco: Never Used  Substance and Sexual Activity  . Alcohol use: No  . Drug use: No  . Sexual activity: Not Currently

## 2017-08-23 NOTE — Progress Notes (Signed)
 .  Numeric Pain Rating Scale and Functional Assessment Average Pain 8   In the last MONTH (on 0-10 scale) has pain interfered with the following?  1. General activity like being  able to carry out your everyday physical activities such as walking, climbing stairs, carrying groceries, or moving a chair?  Rating(7)   +Driver, -BT, -Dye Allergies.  

## 2017-08-23 NOTE — Procedures (Signed)
Lumbosacral Transforaminal Epidural Steroid Injection - Sub-Pedicular Approach with Fluoroscopic Guidance  Patient: Joanne Holmes      Date of Birth: 17-Nov-1945 MRN: [id number] PCP: Delilah Shan, MD      Visit Date: 08/23/2017   Universal Protocol:    Date/Time: 08/23/2017  Consent Given By: the patient  Position: PRONE  Additional Comments: Vital signs were monitored before and after the procedure. Patient was prepped and draped in the usual sterile fashion. The correct patient, procedure, and site was verified.   Injection Procedure Details:  Procedure Site One Meds Administered:  Meds ordered this encounter  Medications  . methylPREDNISolone acetate (DEPO-MEDROL) injection 80 mg    Laterality: Right  Location/Site:  L5-S1  Needle size: 22 G  Needle type: Spinal  Needle Placement: Transforaminal  Findings:    -Comments: Excellent flow of contrast along the nerve and into the epidural space.  Procedure Details: After squaring off the end-plates to get a true AP view, the C-arm was positioned so that an oblique view of the foramen as noted above was visualized. The target area is just inferior to the "nose of the scotty dog" or sub pedicular. The soft tissues overlying this structure were infiltrated with 2-3 ml. of 1% Lidocaine without Epinephrine.  The spinal needle was inserted toward the target using a "trajectory" view along the fluoroscope beam.  Under AP and lateral visualization, the needle was advanced so it did not puncture dura and was located close the 6 O'Clock position of the pedical in AP tracterory. Biplanar projections were used to confirm position. Aspiration was confirmed to be negative for CSF and/or blood. A 1-2 ml. volume of Isovue-250 was injected and flow of contrast was noted at each level. Radiographs were obtained for documentation purposes.   After attaining the desired flow of contrast documented above, a 0.5 to 1.0 ml test dose of  0.25% Marcaine was injected into each respective transforaminal space.  The patient was observed for 90 seconds post injection.  After no sensory deficits were reported, and normal lower extremity motor function was noted,   the above injectate was administered so that equal amounts of the injectate were placed at each foramen (level) into the transforaminal epidural space.   Additional Comments:  The patient tolerated the procedure well Dressing: antiobiotic ointment    Post-procedure details: Patient was observed during the procedure. Post-procedure instructions were reviewed.  Patient left the clinic in stable condition.

## 2017-12-06 ENCOUNTER — Telehealth (INDEPENDENT_AMBULATORY_CARE_PROVIDER_SITE_OTHER): Payer: Self-pay | Admitting: Physical Medicine and Rehabilitation

## 2017-12-06 NOTE — Telephone Encounter (Signed)
Needs auth for [id number] [id number].

## 2017-12-06 NOTE — Telephone Encounter (Signed)
Authorization [id number] Auth Effective Date:09/12/2019Auth End Date:03/06/2018

## 2017-12-06 NOTE — Telephone Encounter (Signed)
Yes ok 

## 2017-12-06 NOTE — Telephone Encounter (Signed)
Pt is scheduled for 12/18/17 with driver.

## 2017-12-13 ENCOUNTER — Telehealth (INDEPENDENT_AMBULATORY_CARE_PROVIDER_SITE_OTHER): Payer: Self-pay | Admitting: Radiology

## 2017-12-13 NOTE — Telephone Encounter (Signed)
Patient is currently scheduled for injection on 12/18/17. States is currently has had steroid injection and taking high dose prednisone by mouth due to asthma attack. Is it ok to keep appointment or does she need to reschedule? Concerned if this will be too much steroid at one time.  Call back # [phone]

## 2017-12-14 NOTE — Telephone Encounter (Signed)
I would probably hold off at least 4 weeks after the last dose of prednisone and less that is going to be an ongoing prednisone taper.  If it is going to be a taper when she is in the taper phase after a few weeks we could do the injection.

## 2017-12-14 NOTE — Telephone Encounter (Signed)
Per Dr. Ernestina Patches pt is scheduled for ijection 01/15/18 4 weeks after last dose of prednisone.

## 2017-12-18 ENCOUNTER — Encounter (INDEPENDENT_AMBULATORY_CARE_PROVIDER_SITE_OTHER): Payer: Self-pay | Admitting: Physical Medicine and Rehabilitation

## 2017-12-19 ENCOUNTER — Ambulatory Visit (INDEPENDENT_AMBULATORY_CARE_PROVIDER_SITE_OTHER): Payer: Medicare HMO | Admitting: Orthopaedic Surgery

## 2017-12-19 ENCOUNTER — Encounter (INDEPENDENT_AMBULATORY_CARE_PROVIDER_SITE_OTHER): Payer: Self-pay | Admitting: Orthopaedic Surgery

## 2017-12-19 DIAGNOSIS — G5601 Carpal tunnel syndrome, right upper limb: Secondary | ICD-10-CM | POA: Diagnosis not present

## 2017-12-19 NOTE — Progress Notes (Signed)
Office Visit Note   Patient: Joanne Holmes           Date of Birth: 1945-12-31           MRN: [id number] Visit Date: 12/19/2017              Requested by: Delilah Shan, MD [id number] [place], [place] [address] PCP: Delilah Shan, MD   Assessment & Plan: Visit Diagnoses:  1. Carpal tunnel syndrome, right upper limb     Plan: Most likely she does have a simple cyst on the radial aspect of her right wrist in the volar surface but in proximity to the radial artery so I would not recommend an aspiration at all.  I think this is something we should just observe since it is not really bothering her.  As far as her carpal tunnel syndrome, she is incised wrist splint at night and a one-time steroid injection in her transverse carpal ligament with just half a dose of steroid and a cc of lidocaine and she tolerated this well.  I will see her back in about 3 months to see how she is doing overall.  All question concerns were answered and addressed.  Follow-Up Instructions: Return in about 3 months (around 03/20/2018).   Orders:  No orders of the defined types were placed in this encounter.  No orders of the defined types were placed in this encounter.     Procedures: No procedures performed   Clinical Data: No additional findings.   Subjective: Chief Complaint  Patient presents with  . Right Wrist - Cyst  The patient comes in today for assessment of her right wrist.  She points to the volar aspect of her wrist is an area that she feels may have a small mass or a cyst.  It is not painful to her but she noticed it about a few months ago.  She does have a history of carpal tunnel syndrome on this side but has not had surgery.  This is her right dominant hand and wrist.  She does have a history of left carpal tunnel syndrome and a left open carpal tunnel release years ago.  At that time she was told she has significant carpal tunnel syndrome on the left side but only  mild carpal tunnel on the right side.  Recently she has been getting numbness and tingling is waking her up at night.  She is recently started on a steroid taper due to an asthma flareup.  She is not a diabetic.  She denies any neck pain.  HPI  Review of Systems She currently reports improvement in terms of her shortness of breath.  She denies any chest pain, fever, chills, nausea, vomiting.  Objective: Vital Signs: There were no vitals taken for this visit.  Physical Exam She is alert and oriented x3 and in no acute distress Ortho Exam Examination of her right wrist shows a palpable mass in the soft tissue over the radial artery.  Radial artery is congruent with this area in terms of both move together however I do feel that it is separate in terms of not an aneurysm but more of a cyst type structure.  She has a strong pulse in the same area on her other wrist.  She does have weak pinch and grip strength and subjective numbness in the median nerve distribution. Specialty Comments:  No specialty comments available.  Imaging: No results found.   PMFS History:  Patient Active Problem List   Diagnosis Date Noted  . Hypoxia 12/22/2016  . DOE (dyspnea on exertion) 12/22/2016  . Spinal stenosis of lumbar region with neurogenic claudication 04/27/2016  . Post laminectomy syndrome 04/27/2016  . Essential hypertension 05/11/2014  . Persistent vomiting 03/18/2012  . Hypertensive urgency 03/18/2012  . COPD (chronic obstructive pulmonary disease) ([facility]) 03/18/2012   Past Medical History:  Diagnosis Date  . Allergy   . Asthma   . COPD (chronic obstructive pulmonary disease) ([facility])   . Hypertension   . Kidney stone   . Neuromuscular disorder ([facility])    carpal tunnel- left  . Pneumonia   . Spinal stenosis     Family History  Problem Relation Age of Onset  . CAD Mother   . Cancer Father        lymphosarcoma  . Breast cancer Neg Hx     Past Surgical History:  Procedure Laterality Date    . ABDOMINAL HYSTERECTOMY    . BACK SURGERY  25+ yrs ago   lumbar laminectomy L5-S1  . BLADDER SUSPENSION  ~10 yrs ago  . CARPAL TUNNEL RELEASE  03/08/2012   Procedure: CARPAL TUNNEL RELEASE;  Surgeon: Cammie Sickle., MD;  Location: [facility];  Service: Orthopedics;  Laterality: Left;  . SALPINGOOPHORECTOMY  ~8 yrs ago  . TONSILLECTOMY     Social History   Occupational History  . Not on file  Tobacco Use  . Smoking status: Former Smoker    Packs/day: 2.00    Years: 20.00    Pack years: 40.00    Types: Cigarettes    Last attempt to quit: 03/06/1981    Years since quitting: 36.8  . Smokeless tobacco: Never Used  Substance and Sexual Activity  . Alcohol use: No  . Drug use: No  . Sexual activity: Not Currently

## 2018-01-15 ENCOUNTER — Ambulatory Visit (INDEPENDENT_AMBULATORY_CARE_PROVIDER_SITE_OTHER): Payer: Self-pay

## 2018-01-15 ENCOUNTER — Encounter (INDEPENDENT_AMBULATORY_CARE_PROVIDER_SITE_OTHER): Payer: Self-pay | Admitting: Physical Medicine and Rehabilitation

## 2018-01-15 ENCOUNTER — Ambulatory Visit (INDEPENDENT_AMBULATORY_CARE_PROVIDER_SITE_OTHER): Payer: Medicare HMO | Admitting: Physical Medicine and Rehabilitation

## 2018-01-15 VITALS — BP 164/72 | HR 90 | Temp 98.3°F

## 2018-01-15 DIAGNOSIS — M961 Postlaminectomy syndrome, not elsewhere classified: Secondary | ICD-10-CM

## 2018-01-15 DIAGNOSIS — M48062 Spinal stenosis, lumbar region with neurogenic claudication: Secondary | ICD-10-CM | POA: Diagnosis not present

## 2018-01-15 DIAGNOSIS — M5416 Radiculopathy, lumbar region: Secondary | ICD-10-CM

## 2018-01-15 MED ORDER — BETAMETHASONE SOD PHOS & ACET 6 (3-3) MG/ML IJ SUSP
12.0000 mg | Freq: Once | INTRAMUSCULAR | Status: AC
  Administered 2018-01-15: 12 mg

## 2018-01-15 NOTE — Progress Notes (Signed)
 .  Numeric Pain Rating Scale and Functional Assessment Average Pain 6   In the last MONTH (on 0-10 scale) has pain interfered with the following?  1. General activity like being  able to carry out your everyday physical activities such as walking, climbing stairs, carrying groceries, or moving a chair?  Rating(4)   +Driver, -BT, -Dye Allergies.  

## 2018-01-15 NOTE — Patient Instructions (Signed)

## 2018-01-15 NOTE — Procedures (Signed)
Lumbosacral Transforaminal Epidural Steroid Injection - Sub-Pedicular Approach with Fluoroscopic Guidance  Patient: Joanne Holmes      Date of Birth: 10-27-1945 MRN: [id number] PCP: Delilah Shan, MD      Visit Date: 01/15/2018   Universal Protocol:    Date/Time: 01/15/2018  Consent Given By: the patient  Position: PRONE  Additional Comments: Vital signs were monitored before and after the procedure. Patient was prepped and draped in the usual sterile fashion. The correct patient, procedure, and site was verified.   Injection Procedure Details:  Procedure Site One Meds Administered:  Meds ordered this encounter  Medications  . betamethasone acetate-betamethasone sodium phosphate (CELESTONE) injection 12 mg    Laterality: Left  Location/Site:  L5-S1  Needle size: 22 G  Needle type: Spinal  Needle Placement: Transforaminal  Findings:    -Comments: Excellent flow of contrast along the nerve and into the epidural space.  Procedure Details: After squaring off the end-plates to get a true AP view, the C-arm was positioned so that an oblique view of the foramen as noted above was visualized. The target area is just inferior to the "nose of the scotty dog" or sub pedicular. The soft tissues overlying this structure were infiltrated with 2-3 ml. of 1% Lidocaine without Epinephrine.  The spinal needle was inserted toward the target using a "trajectory" view along the fluoroscope beam.  Under AP and lateral visualization, the needle was advanced so it did not puncture dura and was located close the 6 O'Clock position of the pedical in AP tracterory. Biplanar projections were used to confirm position. Aspiration was confirmed to be negative for CSF and/or blood. A 1-2 ml. volume of Isovue-250 was injected and flow of contrast was noted at each level. Radiographs were obtained for documentation purposes.   After attaining the desired flow of contrast documented above, a 0.5 to  1.0 ml test dose of 0.25% Marcaine was injected into each respective transforaminal space.  The patient was observed for 90 seconds post injection.  After no sensory deficits were reported, and normal lower extremity motor function was noted,   the above injectate was administered so that equal amounts of the injectate were placed at each foramen (level) into the transforaminal epidural space.   Additional Comments:  The patient tolerated the procedure well Dressing: Band-Aid    Post-procedure details: Patient was observed during the procedure. Post-procedure instructions were reviewed.  Patient left the clinic in stable condition.

## 2018-01-23 NOTE — Progress Notes (Signed)
Joanne Holmes - 72 y.o. female MRN [id number]  Date of birth: [birth year]  Office Visit Note: Visit Date: 01/15/2018 PCP: Delilah Shan, MD Referred by: Delilah Shan, MD  Subjective: Chief Complaint  Patient presents with  . Lower Back - Pain   HPI:  Joanne Holmes is a 72 y.o. female who comes in today For planned left L5 transforaminal epidural steroid injection.  Last time we saw her was in May of this year and we completed left L5 transforaminal injection with good relief of her symptoms for several months.  She had prior injections by Dr. Sherwood Gambler at [facility] neurosurgery.  She had prior lumbar surgery but did not feel like she was a good candidate for further surgery.  She does have severe spinal stenosis at L3-4, L4-5 and facet arthropathy through the lower lumbar region.  She had prior left laminectomy at L5-S1 with some scar tissue located on the left S1 nerve root.  She continues to take some opioid medication.  She has had multiple rounds of physical therapy in the past but not recently.  She still pretty active and does not let this bother her in terms of her activity.  She rates her pain as a 6 out of 10.  She mainly reports back pain without leg pain at this point.  No referral or paresthesias.  We have had long discussions with her about facet arthropathy and potential for radiofrequency ablation.  She also likely represents a decent candidate for spinal cord stimulation.  We are going to repeat the left L5 transforaminal injection because this has helped her in the past.  But depending on relief would look at diagnostic medial branch blocks and discuss spinal cord stimulator trial.  ROS Otherwise per HPI.  Assessment & Plan: Visit Diagnoses:  1. Lumbar radiculopathy   2. Spinal stenosis of lumbar region with neurogenic claudication   3. Post laminectomy syndrome     Plan: No additional findings.   Meds & Orders:  Meds ordered this encounter  Medications  .  betamethasone acetate-betamethasone sodium phosphate (CELESTONE) injection 12 mg    Orders Placed This Encounter  Procedures  . XR C-ARM NO REPORT  . Epidural Steroid injection    Follow-up: Return if symptoms worsen or fail to improve.   Procedures: No procedures performed  Lumbosacral Transforaminal Epidural Steroid Injection - Sub-Pedicular Approach with Fluoroscopic Guidance  Patient: Joanne Holmes      Date of Birth: [birth year] MRN: [id number] PCP: Delilah Shan, MD      Visit Date: 01/15/2018   Universal Protocol:    Date/Time: 01/15/2018  Consent Given By: the patient  Position: PRONE  Additional Comments: Vital signs were monitored before and after the procedure. Patient was prepped and draped in the usual sterile fashion. The correct patient, procedure, and site was verified.   Injection Procedure Details:  Procedure Site One Meds Administered:  Meds ordered this encounter  Medications  . betamethasone acetate-betamethasone sodium phosphate (CELESTONE) injection 12 mg    Laterality: Left  Location/Site:  L5-S1  Needle size: 22 G  Needle type: Spinal  Needle Placement: Transforaminal  Findings:    -Comments: Excellent flow of contrast along the nerve and into the epidural space.  Procedure Details: After squaring off the end-plates to get a true AP view, the C-arm was positioned so that an oblique view of the foramen as noted above was visualized. The target area is just inferior to the "nose of the scotty  dog" or sub pedicular. The soft tissues overlying this structure were infiltrated with 2-3 ml. of 1% Lidocaine without Epinephrine.  The spinal needle was inserted toward the target using a "trajectory" view along the fluoroscope beam.  Under AP and lateral visualization, the needle was advanced so it did not puncture dura and was located close the 6 O'Clock position of the pedical in AP tracterory. Biplanar projections were used to confirm  position. Aspiration was confirmed to be negative for CSF and/or blood. A 1-2 ml. volume of Isovue-250 was injected and flow of contrast was noted at each level. Radiographs were obtained for documentation purposes.   After attaining the desired flow of contrast documented above, a 0.5 to 1.0 ml test dose of 0.25% Marcaine was injected into each respective transforaminal space.  The patient was observed for 90 seconds post injection.  After no sensory deficits were reported, and normal lower extremity motor function was noted,   the above injectate was administered so that equal amounts of the injectate were placed at each foramen (level) into the transforaminal epidural space.   Additional Comments:  The patient tolerated the procedure well Dressing: Band-Aid    Post-procedure details: Patient was observed during the procedure. Post-procedure instructions were reviewed.  Patient left the clinic in stable condition.    Clinical History: Lumbar spine MRI dated 03/01/2016  Alignment: 4 mm anterolisthesis L3-4 with mild progression since 2013. 6.6 mm anterolisthesis L4-5 unchanged. Mild retrolisthesis L1-2.  Vertebrae:  Negative for fracture or mass.  Normal bone marrow  Conus medullaris: Extends to the L1-2 level and appears normal.  Paraspinal and other soft tissues: No retroperitoneal mass or adenopathy. Paraspinous muscles are symmetric with mild atrophy.  Disc levels:  L1-2: Mild retrolisthesis. Disc bulging and mild facet degeneration without significant stenosis.  L2-3:  Mild disc and facet degeneration.  Mild spinal stenosis.  L3-4: Severe spinal stenosis which has progressed in the interval. There is anterior listhesis of L3 on L4. Diffuse disc bulging. Severe facet degeneration has progressed in the interval. There is marked subarticular stenosis bilaterally as well as moderate foraminal encroachment bilaterally.  L4-5: Severe spinal stenosis has progressed  in the interval. Severe facet degeneration. Grade 1 anterior listhesis unchanged. Subarticular and foraminal encroachment bilaterally.  L5-S1: Severe disc degeneration with disc space narrowing and endplate spurring. Prior laminectomy on the left. Diffuse endplate scarring is present. There is soft tissue surrounding the left S1 nerve root which is unchanged most likely due to scar tissue. No recurrent disc protrusion. Mild foraminal narrowing bilaterally due to spurring.     Objective:  VS:  HT:    WT:   BMI:     BP:(!) 164/72  HR:90bpm  TEMP:98.3 F (36.8 C)(Oral)  RESP:  Physical Exam  Ortho Exam Imaging: No results found.

## 2018-03-25 ENCOUNTER — Telehealth (INDEPENDENT_AMBULATORY_CARE_PROVIDER_SITE_OTHER): Payer: Self-pay | Admitting: *Deleted

## 2018-03-26 NOTE — Telephone Encounter (Signed)
Okay if last injection helped a lot and she feels like this is the same issue.  I have thought in the past about doing medial branch blocks and maybe we even tried that I am not sure but okay to repeat if she felt like it helped and is the same issue.

## 2018-04-02 NOTE — Telephone Encounter (Signed)
Service [id number] Auth Effective Date:01/07/2020Auth End Date:07/05/2020Initiated Date:01/07/2020Decision Date:01/07/2020Decision Type :InitialCase Status:Approved  Pt is scheduled for 04/15/2018 with driver.

## 2018-04-03 ENCOUNTER — Ambulatory Visit (INDEPENDENT_AMBULATORY_CARE_PROVIDER_SITE_OTHER): Payer: Medicare HMO | Admitting: Orthopaedic Surgery

## 2018-04-03 ENCOUNTER — Encounter (INDEPENDENT_AMBULATORY_CARE_PROVIDER_SITE_OTHER): Payer: Self-pay | Admitting: Orthopaedic Surgery

## 2018-04-03 DIAGNOSIS — G5601 Carpal tunnel syndrome, right upper limb: Secondary | ICD-10-CM | POA: Diagnosis not present

## 2018-04-03 NOTE — Progress Notes (Signed)
The patient is a 73 year old female well-known to me.  She has a clinical diagnosis of right upper extremity carpal tunnel syndrome.  She actually had a left carpal tunnel release done years ago.  When I saw her 3 months ago we placed a steroid injection in the transverse carpal ligament area of her right wrist and she did significantly better for only about 3 weeks.  She still points to the median nerve distribution as a source of her numbness and tingling with numbness and tingling in the thumb index and middle fingers.  She does report weak grip strength on that side.  We had recommended a night splint and gave her 1 but she forgot to wear it.  On exam she does show weak grip strength on the right side.  She has a positive Phalen's and Tinel sign and some muscle atrophy in the hand.  I would like to send her to Dr. Ernestina Patches for nerve conduction studies of the right upper extremity to assess for the severity of her carpal tunnel syndrome so we can make the appropriate recommendations.  She has seen Dr. Ernestina Patches before for injections in her lumbar spine and is familiar with him.  The day that she does have her studies I would like his section of the office today and get her a follow-up with me once they have the results of the studies.  All question concerns were answered and addressed.

## 2018-04-04 ENCOUNTER — Other Ambulatory Visit (INDEPENDENT_AMBULATORY_CARE_PROVIDER_SITE_OTHER): Payer: Self-pay

## 2018-04-04 DIAGNOSIS — G5601 Carpal tunnel syndrome, right upper limb: Secondary | ICD-10-CM

## 2018-04-15 ENCOUNTER — Ambulatory Visit (INDEPENDENT_AMBULATORY_CARE_PROVIDER_SITE_OTHER): Payer: Self-pay

## 2018-04-15 ENCOUNTER — Encounter (INDEPENDENT_AMBULATORY_CARE_PROVIDER_SITE_OTHER): Payer: Self-pay | Admitting: Physical Medicine and Rehabilitation

## 2018-04-15 ENCOUNTER — Ambulatory Visit (INDEPENDENT_AMBULATORY_CARE_PROVIDER_SITE_OTHER): Payer: Medicare HMO | Admitting: Physical Medicine and Rehabilitation

## 2018-04-15 VITALS — BP 143/76 | HR 54 | Temp 98.5°F | Ht 62.0 in | Wt 177.0 lb

## 2018-04-15 DIAGNOSIS — M48062 Spinal stenosis, lumbar region with neurogenic claudication: Secondary | ICD-10-CM | POA: Diagnosis not present

## 2018-04-15 DIAGNOSIS — M5416 Radiculopathy, lumbar region: Secondary | ICD-10-CM

## 2018-04-15 DIAGNOSIS — M961 Postlaminectomy syndrome, not elsewhere classified: Secondary | ICD-10-CM

## 2018-04-15 MED ORDER — BETAMETHASONE SOD PHOS & ACET 6 (3-3) MG/ML IJ SUSP
12.0000 mg | Freq: Once | INTRAMUSCULAR | Status: AC
  Administered 2018-04-15: 12 mg

## 2018-04-15 NOTE — Patient Instructions (Signed)

## 2018-04-16 NOTE — Procedures (Signed)
Lumbosacral Transforaminal Epidural Steroid Injection - Sub-Pedicular Approach with Fluoroscopic Guidance  Patient: Joanne Holmes      Date of Birth: [birth year] MRN: [id number] PCP: Delilah Shan, MD      Visit Date: 04/15/2018   Universal Protocol:    Date/Time: 04/15/2018  Consent Given By: the patient  Position: PRONE  Additional Comments: Vital signs were monitored before and after the procedure. Patient was prepped and draped in the usual sterile fashion. The correct patient, procedure, and site was verified.   Injection Procedure Details:  Procedure Site One Meds Administered:  Meds ordered this encounter  Medications  . betamethasone acetate-betamethasone sodium phosphate (CELESTONE) injection 12 mg    Laterality: Left  Location/Site:  L5-S1  Needle size: 22 G  Needle type: Spinal  Needle Placement: Transforaminal  Findings:    -Comments: Excellent flow of contrast along the nerve and into the epidural space.  Procedure Details: After squaring off the end-plates to get a true AP view, the C-arm was positioned so that an oblique view of the foramen as noted above was visualized. The target area is just inferior to the "nose of the scotty dog" or sub pedicular. The soft tissues overlying this structure were infiltrated with 2-3 ml. of 1% Lidocaine without Epinephrine.  The spinal needle was inserted toward the target using a "trajectory" view along the fluoroscope beam.  Under AP and lateral visualization, the needle was advanced so it did not puncture dura and was located close the 6 O'Clock position of the pedical in AP tracterory. Biplanar projections were used to confirm position. Aspiration was confirmed to be negative for CSF and/or blood. A 1-2 ml. volume of Isovue-250 was injected and flow of contrast was noted at each level. Radiographs were obtained for documentation purposes.   After attaining the desired flow of contrast documented above, a 0.5 to  1.0 ml test dose of 0.25% Marcaine was injected into each respective transforaminal space.  The patient was observed for 90 seconds post injection.  After no sensory deficits were reported, and normal lower extremity motor function was noted,   the above injectate was administered so that equal amounts of the injectate were placed at each foramen (level) into the transforaminal epidural space.   Additional Comments:  The patient tolerated the procedure well Dressing: Band-Aid    Post-procedure details: Patient was observed during the procedure. Post-procedure instructions were reviewed.  Patient left the clinic in stable condition.

## 2018-04-16 NOTE — Progress Notes (Signed)
Numeric Pain Rating Scale and Functional Assessment Average Pain 3   In the last MONTH (on 0-10 scale) has pain interfered with the following?  1. General activity like being  able to carry out your everyday physical activities such as walking, climbing stairs, carrying groceries, or moving a chair?  Rating(3)   +Driver, -BT, -Dye Allergies.

## 2018-04-16 NOTE — Progress Notes (Signed)
CHANTEL TETI - 73 y.o. female MRN [id number]  Date of birth: 1945/11/15  Office Visit Note: Visit Date: 04/15/2018 PCP: Delilah Shan, MD Referred by: Delilah Shan, MD  Subjective: Chief Complaint  Patient presents with  . Spine - Pain   HPI: VALISHA HESLIN is a 73 y.o. female who comes in today For likely repeat transforaminal epidural steroid injection.  Patient has a well-known history with Dr. Sherwood Gambler with prior lumbar laminectomy discectomy at L5-S1 and with severe multifactorial stenosis at L3-4 and L4-5.  Interestingly she does not get much in the way of leg pain at all.  She gets a lot of back pain referring into the buttock region more left than right.  Dr. Sherwood Gambler had completed L5-S1 interlaminar injections for some time with decent relief and he would do these about 3 or 4 times a year.  Since we have seen her we have completed interlaminar injections with some relief but it was kind of hit or miss over the last couple of injections.  Last injection I decided to complete transforaminal injection which I feel like would be safer anyway given that she has had a prior laminectomy at L5-S1 and she did extremely well and feels like that helped more than any other shots that she is ever had.  Her pain is left low back more than right.  Depending on relief always would consider transforaminal injection at L3 or L4 with a level of stenosis is.  Consider facet joint block for more back pain but overall she did well with epidural injection.  Patient probably represents a good candidate for spinal cord stimulator trial even though she does have severe stenosis of think it would be appropriate given that she does not have claudication symptoms.  ROS Otherwise per HPI.  Assessment & Plan: Visit Diagnoses:  1. Lumbar radiculopathy   2. Spinal stenosis of lumbar region with neurogenic claudication   3. Post laminectomy syndrome     Plan: No additional findings.   Meds & Orders:    Meds ordered this encounter  Medications  . betamethasone acetate-betamethasone sodium phosphate (CELESTONE) injection 12 mg    Orders Placed This Encounter  Procedures  . XR C-ARM NO REPORT  . Epidural Steroid injection    Follow-up: Return if symptoms worsen or fail to improve.   Procedures: No procedures performed  Lumbosacral Transforaminal Epidural Steroid Injection - Sub-Pedicular Approach with Fluoroscopic Guidance  Patient: AZHANE ECKART      Date of Birth: 1946-02-16 MRN: [id number] PCP: Delilah Shan, MD      Visit Date: 04/15/2018   Universal Protocol:    Date/Time: 04/15/2018  Consent Given By: the patient  Position: PRONE  Additional Comments: Vital signs were monitored before and after the procedure. Patient was prepped and draped in the usual sterile fashion. The correct patient, procedure, and site was verified.   Injection Procedure Details:  Procedure Site One Meds Administered:  Meds ordered this encounter  Medications  . betamethasone acetate-betamethasone sodium phosphate (CELESTONE) injection 12 mg    Laterality: Left  Location/Site:  L5-S1  Needle size: 22 G  Needle type: Spinal  Needle Placement: Transforaminal  Findings:    -Comments: Excellent flow of contrast along the nerve and into the epidural space.  Procedure Details: After squaring off the end-plates to get a true AP view, the C-arm was positioned so that an oblique view of the foramen as noted above was visualized. The target area is just  inferior to the "nose of the scotty dog" or sub pedicular. The soft tissues overlying this structure were infiltrated with 2-3 ml. of 1% Lidocaine without Epinephrine.  The spinal needle was inserted toward the target using a "trajectory" view along the fluoroscope beam.  Under AP and lateral visualization, the needle was advanced so it did not puncture dura and was located close the 6 O'Clock position of the pedical in AP  tracterory. Biplanar projections were used to confirm position. Aspiration was confirmed to be negative for CSF and/or blood. A 1-2 ml. volume of Isovue-250 was injected and flow of contrast was noted at each level. Radiographs were obtained for documentation purposes.   After attaining the desired flow of contrast documented above, a 0.5 to 1.0 ml test dose of 0.25% Marcaine was injected into each respective transforaminal space.  The patient was observed for 90 seconds post injection.  After no sensory deficits were reported, and normal lower extremity motor function was noted,   the above injectate was administered so that equal amounts of the injectate were placed at each foramen (level) into the transforaminal epidural space.   Additional Comments:  The patient tolerated the procedure well Dressing: Band-Aid    Post-procedure details: Patient was observed during the procedure. Post-procedure instructions were reviewed.  Patient left the clinic in stable condition.      Clinical History: Lumbar spine MRI dated 03/01/2016  Alignment: 4 mm anterolisthesis L3-4 with mild progression since 2013. 6.6 mm anterolisthesis L4-5 unchanged. Mild retrolisthesis L1-2.  Vertebrae:  Negative for fracture or mass.  Normal bone marrow  Conus medullaris: Extends to the L1-2 level and appears normal.  Paraspinal and other soft tissues: No retroperitoneal mass or adenopathy. Paraspinous muscles are symmetric with mild atrophy.  Disc levels:  L1-2: Mild retrolisthesis. Disc bulging and mild facet degeneration without significant stenosis.  L2-3:  Mild disc and facet degeneration.  Mild spinal stenosis.  L3-4: Severe spinal stenosis which has progressed in the interval. There is anterior listhesis of L3 on L4. Diffuse disc bulging. Severe facet degeneration has progressed in the interval. There is marked subarticular stenosis bilaterally as well as moderate foraminal encroachment  bilaterally.  L4-5: Severe spinal stenosis has progressed in the interval. Severe facet degeneration. Grade 1 anterior listhesis unchanged. Subarticular and foraminal encroachment bilaterally.  L5-S1: Severe disc degeneration with disc space narrowing and endplate spurring. Prior laminectomy on the left. Diffuse endplate scarring is present. There is soft tissue surrounding the left S1 nerve root which is unchanged most likely due to scar tissue. No recurrent disc protrusion. Mild foraminal narrowing bilaterally due to spurring.   She reports that she quit smoking about 37 years ago. Her smoking use included cigarettes. She has a 40.00 pack-year smoking history. She has never used smokeless tobacco. No results for input(s): HGBA1C, LABURIC in the last 8760 hours.  Objective:  VS:  HT:5\' 2"  (157.5 cm)   WT:177 lb (80.3 kg)  BMI:32.37    BP:(!) 143/76  HR:(!) 54bpm  TEMP:98.5 F (36.9 C)(Oral)  RESP:  Physical Exam  Ortho Exam Imaging: Xr C-arm No Report  Result Date: 04/15/2018 Please see Notes tab for imaging impression.   Past Medical/Family/Surgical/Social History: Medications & Allergies reviewed per EMR, new medications updated. Patient Active Problem List   Diagnosis Date Noted  . Hypoxia 12/22/2016  . DOE (dyspnea on exertion) 12/22/2016  . Spinal stenosis of lumbar region with neurogenic claudication 04/27/2016  . Post laminectomy syndrome 04/27/2016  . Essential hypertension 05/11/2014  .  Persistent vomiting 03/18/2012  . Hypertensive urgency 03/18/2012  . COPD (chronic obstructive pulmonary disease) ([place]) 03/18/2012   Past Medical History:  Diagnosis Date  . Allergy   . Asthma   . COPD (chronic obstructive pulmonary disease) ([place])   . Hypertension   . Kidney stone   . Neuromuscular disorder ([facility])    carpal tunnel- left  . Pneumonia   . Spinal stenosis    Family History  Problem Relation Age of Onset  . CAD Mother   . Cancer Father         lymphosarcoma  . Breast cancer Neg Hx    Past Surgical History:  Procedure Laterality Date  . ABDOMINAL HYSTERECTOMY    . BACK SURGERY  25+ yrs ago   lumbar laminectomy L5-S1  . BLADDER SUSPENSION  ~10 yrs ago  . CARPAL TUNNEL RELEASE  03/08/2012   Procedure: CARPAL TUNNEL RELEASE;  Surgeon: Cammie Sickle., MD;  Location: [facility];  Service: Orthopedics;  Laterality: Left;  . SALPINGOOPHORECTOMY  ~8 yrs ago  . TONSILLECTOMY     Social History   Occupational History  . Not on file  Tobacco Use  . Smoking status: Former Smoker    Packs/day: 2.00    Years: 20.00    Pack years: 40.00    Types: Cigarettes    Last attempt to quit: 03/06/1981    Years since quitting: 37.1  . Smokeless tobacco: Never Used  Substance and Sexual Activity  . Alcohol use: No  . Drug use: No  . Sexual activity: Not Currently

## 2018-04-19 ENCOUNTER — Encounter (INDEPENDENT_AMBULATORY_CARE_PROVIDER_SITE_OTHER): Payer: Self-pay | Admitting: Physical Medicine and Rehabilitation

## 2018-04-19 ENCOUNTER — Ambulatory Visit (INDEPENDENT_AMBULATORY_CARE_PROVIDER_SITE_OTHER): Payer: Medicare HMO | Admitting: Physical Medicine and Rehabilitation

## 2018-04-19 DIAGNOSIS — R202 Paresthesia of skin: Secondary | ICD-10-CM | POA: Diagnosis not present

## 2018-04-19 NOTE — Progress Notes (Signed)
Joanne Holmes - 73 y.o. female MRN [id number]  Date of birth: 1946-02-15  Office Visit Note: Visit Date: 04/19/2018 PCP: Delilah Shan, MD Referred by: Delilah Shan, MD  Subjective: Chief Complaint  Patient presents with  . Right Arm - Numbness, Tingling  . Right Hand - Numbness, Tingling   HPI: Joanne Holmes is a 73 y.o. female who comes in today At the request of Dr. Jean Rosenthal for electrodiagnostic study of the right upper limb.  Joanne Holmes is right-hand dominant with a history of prior left carpal tunnel release by Dr. Theodis Sato in approximately 2013.  Joanne Holmes evidently did have electrodiagnostic study in their office performed but we do not have that for review.  Joanne Holmes reports that instead of the right hand had some changes but that Joanne Holmes may never need surgery on the right hand.  Joanne Holmes did well after carpal tunnel release on the left.  Joanne Holmes now complains of worsening numbness and tingling in the right hand particularly in the thumb index and middle finger with some referral into the forearm.  Joanne Holmes reports this began about 6 months ago with increasing symptoms lasting for a lot longer.  Joanne Holmes reports several years of intermittent tingling and numbness.  Joanne Holmes reports worsening with trying to use the hand to grab and use small objects.  Joanne Holmes does get some nocturnal complaints.  Does not really endorse pain as much as the numbness and tingling.  Joanne Holmes has a prior history of lumbar surgery and lumbar stenosis for which I have seen her on numerous occasions.  Joanne Holmes does not have a history of cervical spine issues.  ROS Otherwise per HPI.  Assessment & Plan: Visit Diagnoses:  1. Paresthesia of skin     Plan: Impression: The above electrodiagnostic study is ABNORMAL and reveals evidence of a severe right median nerve entrapment at the wrist (carpal tunnel syndrome) affecting sensory and motor components.  While the lesion does indicate some axonal damage there is fairly normal needle EMG  findings which portends a better outcome with decompression.   There is no significant electrodiagnostic evidence of any other focal nerve entrapment, brachial plexopathy or cervical radiculopathy.   Recommendations: 1.  Follow-up with referring physician. 2.  Continue current management of symptoms. 3.  Suggest surgical evaluation.   Meds & Orders: No orders of the defined types were placed in this encounter.   Orders Placed This Encounter  Procedures  . NCV with EMG (electromyography)    Follow-up: Return for Jean Rosenthal, M.D..   Procedures: No procedures performed  EMG & NCV Findings: Evaluation of the right median motor nerve showed prolonged distal onset latency (6.4 ms), reduced amplitude (4.8 mV), and decreased conduction velocity (Elbow-Wrist, 44 m/s).  The right median (across palm) sensory nerve showed no response (Wrist) and prolonged distal peak latency (Palm, 4.3 ms).  All remaining nerves (as indicated in the following tables) were within normal limits.    Needle evaluation of the right abductor pollicis brevis muscle showed increased insertional activity.  All remaining muscles (as indicated in the following table) showed no evidence of electrical instability.    Impression: The above electrodiagnostic study is ABNORMAL and reveals evidence of a severe right median nerve entrapment at the wrist (carpal tunnel syndrome) affecting sensory and motor components.  While the lesion does indicate some axonal damage there is fairly normal needle EMG findings which portends a better outcome with decompression.   There is no significant electrodiagnostic evidence of any other  focal nerve entrapment, brachial plexopathy or cervical radiculopathy.   Recommendations: 1.  Follow-up with referring physician. 2.  Continue current management of symptoms. 3.  Suggest surgical evaluation.  ___________________________ Laurence Spates FAAPMR Board Certified, American Board of  Physical Medicine and Rehabilitation    Nerve Conduction Studies Anti Sensory Summary Table   Stim Site NR Peak (ms) Norm Peak (ms) P-T Amp (V) Norm P-T Amp Site1 Site2 Delta-P (ms) Dist (cm) Vel (m/s) Norm Vel (m/s)  Right Median Acr Palm Anti Sensory (2nd Digit)  33.4C  Wrist *NR  <3.6  >10 Wrist Palm  0.0    Palm    *4.3 <2.0 39.2         Right Radial Anti Sensory (Base 1st Digit)  32.9C  Wrist    2.2 <3.1 16.9  Wrist Base 1st Digit 2.2 0.0    Right Ulnar Anti Sensory (5th Digit)  33.3C  Wrist    3.2 <3.7 22.9 >15.0 Wrist 5th Digit 3.2 14.0 44 >38   Motor Summary Table   Stim Site NR Onset (ms) Norm Onset (ms) O-P Amp (mV) Norm O-P Amp Site1 Site2 Delta-0 (ms) Dist (cm) Vel (m/s) Norm Vel (m/s)  Right Median Motor (Abd Poll Brev)  32.8C  Wrist    *6.4 <4.2 *4.8 >5 Elbow Wrist 4.3 19.0 *44 >50  Elbow    10.7  4.5         Right Ulnar Motor (Abd Dig Min)  32.8C  Wrist    2.7 <4.2 8.6 >3 B Elbow Wrist 2.8 18.5 66 >53  B Elbow    5.5  7.8  A Elbow B Elbow 1.5 10.0 67 >53  A Elbow    7.0  7.6          EMG   Side Muscle Nerve Root Ins Act Fibs Psw Amp Dur Poly Recrt Int Fraser Din Comment  Right Abd Poll Brev Median C8-T1 *Incr Nml Nml Nml Nml 0 Nml Nml   Right 1stDorInt Ulnar C8-T1 Nml Nml Nml Nml Nml 0 Nml Nml   Right PronatorTeres Median C6-7 Nml Nml Nml Nml Nml 0 Nml Nml   Right Biceps Musculocut C5-6 Nml Nml Nml Nml Nml 0 Nml Nml   Right Deltoid Axillary C5-6 Nml Nml Nml Nml Nml 0 Nml Nml     Nerve Conduction Studies Anti Sensory Left/Right Comparison   Stim Site L Lat (ms) R Lat (ms) L-R Lat (ms) L Amp (V) R Amp (V) L-R Amp (%) Site1 Site2 L Vel (m/s) R Vel (m/s) L-R Vel (m/s)  Median Acr Palm Anti Sensory (2nd Digit)  33.4C  Wrist       Wrist Palm     Palm  *4.3   39.2        Radial Anti Sensory (Base 1st Digit)  32.9C  Wrist  2.2   16.9  Wrist Base 1st Digit     Ulnar Anti Sensory (5th Digit)  33.3C  Wrist  3.2   22.9  Wrist 5th Digit  44    Motor Left/Right  Comparison   Stim Site L Lat (ms) R Lat (ms) L-R Lat (ms) L Amp (mV) R Amp (mV) L-R Amp (%) Site1 Site2 L Vel (m/s) R Vel (m/s) L-R Vel (m/s)  Median Motor (Abd Poll Brev)  32.8C  Wrist  *6.4   *4.8  Elbow Wrist  *44   Elbow  10.7   4.5        Ulnar Motor (Abd Dig Min)  32.[place]  Wrist  2.7   8.6  B Elbow Wrist  66   B Elbow  5.5   7.8  A Elbow B Elbow  67   A Elbow  7.0   7.6           Waveforms:            Clinical History: Lumbar spine MRI dated 03/01/2016  Alignment: 4 mm anterolisthesis L3-4 with mild progression since 2013. 6.6 mm anterolisthesis L4-5 unchanged. Mild retrolisthesis L1-2.  Vertebrae:  Negative for fracture or mass.  Normal bone marrow  Conus medullaris: Extends to the L1-2 level and appears normal.  Paraspinal and other soft tissues: No retroperitoneal mass or adenopathy. Paraspinous muscles are symmetric with mild atrophy.  Disc levels:  L1-2: Mild retrolisthesis. Disc bulging and mild facet degeneration without significant stenosis.  L2-3:  Mild disc and facet degeneration.  Mild spinal stenosis.  L3-4: Severe spinal stenosis which has progressed in the interval. There is anterior listhesis of L3 on L4. Diffuse disc bulging. Severe facet degeneration has progressed in the interval. There is marked subarticular stenosis bilaterally as well as moderate foraminal encroachment bilaterally.  L4-5: Severe spinal stenosis has progressed in the interval. Severe facet degeneration. Grade 1 anterior listhesis unchanged. Subarticular and foraminal encroachment bilaterally.  L5-S1: Severe disc degeneration with disc space narrowing and endplate spurring. Prior laminectomy on the left. Diffuse endplate scarring is present. There is soft tissue surrounding the left S1 nerve root which is unchanged most likely due to scar tissue. No recurrent disc protrusion. Mild foraminal narrowing bilaterally due to spurring.   Joanne Holmes reports that Joanne Holmes quit  smoking about 37 years ago. Her smoking use included cigarettes. Joanne Holmes has a 40.00 pack-year smoking history. Joanne Holmes has never used smokeless tobacco. No results for input(s): HGBA1C, LABURIC in the last 8760 hours.  Objective:  VS:  HT:    WT:   BMI:     BP:   HR: bpm  TEMP: ( )  RESP:  Physical Exam Musculoskeletal:        General: No swelling, tenderness or deformity.     Comments: Inspection reveals no mild flattening of the right APB compared to left but atrophy of the bilateral FDI or hand intrinsics. There is no swelling, color changes, allodynia or dystrophic changes. There is 5 out of 5 strength in the bilateral wrist extension, finger abduction and long finger flexion.  There is decreased sensation to light touch in a median nerve distribution on the right.  There is a positive Phalen's test on the right. There is a negative Hoffmann's test bilaterally.  Skin:    General: Skin is warm and dry.     Findings: No erythema or rash.  Neurological:     General: No focal deficit present.     Mental Status: Joanne Holmes is alert and oriented to person, place, and time.     Motor: No weakness or abnormal muscle tone.     Coordination: Coordination normal.  Psychiatric:        Mood and Affect: Mood normal.        Behavior: Behavior normal.     Ortho Exam Imaging: No results found.  Past Medical/Family/Surgical/Social History: Medications & Allergies reviewed per EMR, new medications updated. Patient Active Problem List   Diagnosis Date Noted  . Hypoxia 12/22/2016  . DOE (dyspnea on exertion) 12/22/2016  . Spinal stenosis of lumbar region with neurogenic claudication 04/27/2016  . Post laminectomy syndrome 04/27/2016  . Essential hypertension 05/11/2014  .  Persistent vomiting 03/18/2012  . Hypertensive urgency 03/18/2012  . COPD (chronic obstructive pulmonary disease) ([place]) 03/18/2012   Past Medical History:  Diagnosis Date  . Allergy   . Asthma   . COPD (chronic obstructive pulmonary  disease) ([facility])   . Hypertension   . Kidney stone   . Neuromuscular disorder ([facility])    carpal tunnel- left  . Pneumonia   . Spinal stenosis    Family History  Problem Relation Age of Onset  . CAD Mother   . Cancer Father        lymphosarcoma  . Breast cancer Neg Hx    Past Surgical History:  Procedure Laterality Date  . ABDOMINAL HYSTERECTOMY    . BACK SURGERY  25+ yrs ago   lumbar laminectomy L5-S1  . BLADDER SUSPENSION  ~10 yrs ago  . CARPAL TUNNEL RELEASE  03/08/2012   Procedure: CARPAL TUNNEL RELEASE;  Surgeon: Cammie Sickle., MD;  Location: [facility];  Service: Orthopedics;  Laterality: Left;  . SALPINGOOPHORECTOMY  ~8 yrs ago  . TONSILLECTOMY     Social History   Occupational History  . Not on file  Tobacco Use  . Smoking status: Former Smoker    Packs/day: 2.00    Years: 20.00    Pack years: 40.00    Types: Cigarettes    Last attempt to quit: 03/06/1981    Years since quitting: 37.1  . Smokeless tobacco: Never Used  Substance and Sexual Activity  . Alcohol use: No  . Drug use: No  . Sexual activity: Not Currently

## 2018-04-19 NOTE — Progress Notes (Signed)
 .  Numeric Pain Rating Scale and Functional Assessment Average Pain 0   In the last MONTH (on 0-10 scale) has pain interfered with the following?  1. General activity like being  able to carry out your everyday physical activities such as walking, climbing stairs, carrying groceries, or moving a chair?  Rating(6)

## 2018-04-23 ENCOUNTER — Ambulatory Visit (INDEPENDENT_AMBULATORY_CARE_PROVIDER_SITE_OTHER): Payer: Medicare HMO | Admitting: Orthopaedic Surgery

## 2018-04-23 ENCOUNTER — Encounter (INDEPENDENT_AMBULATORY_CARE_PROVIDER_SITE_OTHER): Payer: Self-pay | Admitting: Orthopaedic Surgery

## 2018-04-23 DIAGNOSIS — G5601 Carpal tunnel syndrome, right upper limb: Secondary | ICD-10-CM

## 2018-04-23 NOTE — Progress Notes (Signed)
The patient is here today to go over nerve conduction studies of her right upper extremity.  She is 73 years old.  She is had a significant amount of numbness and tingling and weakness.  She is had a history of a previous left open carpal tunnel release done years ago.  At that time in 2013 nerve conduction study showed mild right carpal tunnel syndrome and severe left carpal tunnel syndrome.  She has had worsening symptoms over time so we sent her for new studies and she is here for review of these today.  On exam she does have muscle atrophy in her right hand with a weak grip and pinch strength.  She has numbness in the median nerve distribution.  She has a positive Phalen's and Tinel's exam.  Nerve conduction studies do show severe right carpal tunnel syndrome.  Given the severity of her carpal tunnel syndrome and her symptoms we are recommending an open carpal tunnel release.  We had a long and thorough discussion about this including the risk and benefits of surgery.  I told him about the anatomy and what to expect from the surgery standpoint.  She would like to have this set up as an outpatient soon and I agree with this.  We will work on getting this scheduled and we will see her back in 2 weeks postoperative for suture removal.  All question concerns were answered and addressed.

## 2018-04-23 NOTE — Procedures (Signed)
EMG & NCV Findings: Evaluation of the right median motor nerve showed prolonged distal onset latency (6.4 ms), reduced amplitude (4.8 mV), and decreased conduction velocity (Elbow-Wrist, 44 m/s).  The right median (across palm) sensory nerve showed no response (Wrist) and prolonged distal peak latency (Palm, 4.3 ms).  All remaining nerves (as indicated in the following tables) were within normal limits.    Needle evaluation of the right abductor pollicis brevis muscle showed increased insertional activity.  All remaining muscles (as indicated in the following table) showed no evidence of electrical instability.    Impression: The above electrodiagnostic study is ABNORMAL and reveals evidence of a severe right median nerve entrapment at the wrist (carpal tunnel syndrome) affecting sensory and motor components.  While the lesion does indicate some axonal damage there is fairly normal needle EMG findings which portends a better outcome with decompression.   There is no significant electrodiagnostic evidence of any other focal nerve entrapment, brachial plexopathy or cervical radiculopathy.   Recommendations: 1.  Follow-up with referring physician. 2.  Continue current management of symptoms. 3.  Suggest surgical evaluation.  ___________________________ Laurence Spates FAAPMR Board Certified, American Board of Physical Medicine and Rehabilitation    Nerve Conduction Studies Anti Sensory Summary Table   Stim Site NR Peak (ms) Norm Peak (ms) P-T Amp (V) Norm P-T Amp Site1 Site2 Delta-P (ms) Dist (cm) Vel (m/s) Norm Vel (m/s)  Right Median Acr Palm Anti Sensory (2nd Digit)  33.4C  Wrist *NR  <3.6  >10 Wrist Palm  0.0    Palm    *4.3 <2.0 39.2         Right Radial Anti Sensory (Base 1st Digit)  32.9C  Wrist    2.2 <3.1 16.9  Wrist Base 1st Digit 2.2 0.0    Right Ulnar Anti Sensory (5th Digit)  33.3C  Wrist    3.2 <3.7 22.9 >15.0 Wrist 5th Digit 3.2 14.0 44 >38   Motor Summary Table   Stim  Site NR Onset (ms) Norm Onset (ms) O-P Amp (mV) Norm O-P Amp Site1 Site2 Delta-0 (ms) Dist (cm) Vel (m/s) Norm Vel (m/s)  Right Median Motor (Abd Poll Brev)  32.8C  Wrist    *6.4 <4.2 *4.8 >5 Elbow Wrist 4.3 19.0 *44 >50  Elbow    10.7  4.5         Right Ulnar Motor (Abd Dig Min)  32.8C  Wrist    2.7 <4.2 8.6 >3 B Elbow Wrist 2.8 18.5 66 >53  B Elbow    5.5  7.8  A Elbow B Elbow 1.5 10.0 67 >53  A Elbow    7.0  7.6          EMG   Side Muscle Nerve Root Ins Act Fibs Psw Amp Dur Poly Recrt Int Fraser Din Comment  Right Abd Poll Brev Median C8-T1 *Incr Nml Nml Nml Nml 0 Nml Nml   Right 1stDorInt Ulnar C8-T1 Nml Nml Nml Nml Nml 0 Nml Nml   Right PronatorTeres Median C6-7 Nml Nml Nml Nml Nml 0 Nml Nml   Right Biceps Musculocut C5-6 Nml Nml Nml Nml Nml 0 Nml Nml   Right Deltoid Axillary C5-6 Nml Nml Nml Nml Nml 0 Nml Nml     Nerve Conduction Studies Anti Sensory Left/Right Comparison   Stim Site L Lat (ms) R Lat (ms) L-R Lat (ms) L Amp (V) R Amp (V) L-R Amp (%) Site1 Site2 L Vel (m/s) R Vel (m/s) L-R Vel (m/s)  Median Acr  Palm Anti Sensory (2nd Digit)  33.4C  Wrist       Wrist Palm     Palm  *4.3   39.2        Radial Anti Sensory (Base 1st Digit)  32.9C  Wrist  2.2   16.9  Wrist Base 1st Digit     Ulnar Anti Sensory (5th Digit)  33.3C  Wrist  3.2   22.9  Wrist 5th Digit  44    Motor Left/Right Comparison   Stim Site L Lat (ms) R Lat (ms) L-R Lat (ms) L Amp (mV) R Amp (mV) L-R Amp (%) Site1 Site2 L Vel (m/s) R Vel (m/s) L-R Vel (m/s)  Median Motor (Abd Poll Brev)  32.8C  Wrist  *6.4   *4.8  Elbow Wrist  *44   Elbow  10.7   4.5        Ulnar Motor (Abd Dig Min)  32.8C  Wrist  2.7   8.6  B Elbow Wrist  66   B Elbow  5.5   7.8  A Elbow B Elbow  67   A Elbow  7.0   7.6           Waveforms:

## 2018-05-03 ENCOUNTER — Other Ambulatory Visit: Payer: Self-pay | Admitting: Obstetrics and Gynecology

## 2018-05-03 DIAGNOSIS — Z1231 Encounter for screening mammogram for malignant neoplasm of breast: Secondary | ICD-10-CM

## 2018-05-16 ENCOUNTER — Encounter: Payer: Self-pay | Admitting: Orthopaedic Surgery

## 2018-05-16 DIAGNOSIS — G5601 Carpal tunnel syndrome, right upper limb: Secondary | ICD-10-CM | POA: Diagnosis not present

## 2018-05-30 ENCOUNTER — Ambulatory Visit (INDEPENDENT_AMBULATORY_CARE_PROVIDER_SITE_OTHER): Payer: Medicare HMO | Admitting: Orthopaedic Surgery

## 2018-05-30 ENCOUNTER — Encounter (INDEPENDENT_AMBULATORY_CARE_PROVIDER_SITE_OTHER): Payer: Self-pay | Admitting: Orthopaedic Surgery

## 2018-05-30 DIAGNOSIS — G5601 Carpal tunnel syndrome, right upper limb: Secondary | ICD-10-CM

## 2018-05-30 DIAGNOSIS — Z9889 Other specified postprocedural states: Secondary | ICD-10-CM

## 2018-05-30 NOTE — Progress Notes (Signed)
The patient comes in today at her 2-week visit status post a right open carpal tunnel release.  She says she is doing well overall and has no issues.  She has a little bit of numbness in her fingertips but she said the discomfort she was feeling before surgery has gone.  She is 73 years old.  She does a lot of quilting.  This is her dominant right hand.  On exam I remove the sutures in place Steri-Strips in the palm of her hand.  Her hand is well-perfused.  She moves her thumb and fingers easily.  She has subjective decrease sensation of the tip of her middle finger.  This point should continue increase her activities as comfort allows.  I gave her wound care instructions and how to treat her incision.  She can get back to quilting as comfort allows.  I will see her back for a follow-up appointment in 4 weeks from now.  All question concerns were answered and addressed.

## 2018-05-31 ENCOUNTER — Ambulatory Visit
Admission: RE | Admit: 2018-05-31 | Discharge: 2018-05-31 | Disposition: A | Discharge status: Home / Self Care | Payer: Medicare HMO | Source: Ambulatory Visit | Attending: Obstetrics and Gynecology | Admitting: Obstetrics and Gynecology

## 2018-05-31 DIAGNOSIS — Z1231 Encounter for screening mammogram for malignant neoplasm of breast: Secondary | ICD-10-CM

## 2018-07-03 ENCOUNTER — Ambulatory Visit (INDEPENDENT_AMBULATORY_CARE_PROVIDER_SITE_OTHER): Payer: Medicare HMO | Admitting: Orthopaedic Surgery

## 2018-07-31 ENCOUNTER — Telehealth: Payer: Self-pay | Admitting: Physical Medicine and Rehabilitation

## 2018-07-31 NOTE — Telephone Encounter (Signed)
Yes okay 

## 2018-07-31 NOTE — Telephone Encounter (Signed)
Service [phone] Authorization Number:[id number] Auth Effective Date:05/06/2020Auth End Date:11/02/2020Initiated Date:05/06/2020Decision Date:05/06/2020Decision Type :InitialCase Status:Approved

## 2018-07-31 NOTE — Telephone Encounter (Signed)
Needs auth and scheduling for [phone].

## 2018-07-31 NOTE — Telephone Encounter (Signed)
Pt is scheduled with driver 10/31/7542.

## 2018-08-28 ENCOUNTER — Encounter: Payer: Self-pay | Admitting: Physical Medicine and Rehabilitation

## 2018-08-28 ENCOUNTER — Other Ambulatory Visit: Payer: Self-pay

## 2018-08-28 ENCOUNTER — Ambulatory Visit (INDEPENDENT_AMBULATORY_CARE_PROVIDER_SITE_OTHER): Payer: Medicare HMO | Admitting: Physical Medicine and Rehabilitation

## 2018-08-28 ENCOUNTER — Ambulatory Visit: Payer: Self-pay

## 2018-08-28 VITALS — BP 154/78 | HR 77

## 2018-08-28 DIAGNOSIS — M48062 Spinal stenosis, lumbar region with neurogenic claudication: Secondary | ICD-10-CM | POA: Diagnosis not present

## 2018-08-28 DIAGNOSIS — M5416 Radiculopathy, lumbar region: Secondary | ICD-10-CM | POA: Diagnosis not present

## 2018-08-28 MED ORDER — BETAMETHASONE SOD PHOS & ACET 6 (3-3) MG/ML IJ SUSP
12.0000 mg | Freq: Once | INTRAMUSCULAR | Status: AC
  Administered 2018-08-28: 15:00:00 12 mg

## 2018-08-28 NOTE — Progress Notes (Addendum)
Pt states pain in lower back. Pt states pain started back 3 weeks post injection 01/15/18. Pt states medication helps with pain, increasing activities makes pain worse.  .Numeric Pain Rating Scale and Functional Assessment Average Pain 5   In the last MONTH (on 0-10 scale) has pain interfered with the following?  1. General activity like being  able to carry out your everyday physical activities such as walking, climbing stairs, carrying groceries, or moving a chair?  Rating(6)   +Driver, -BT, -Dye Allergies.

## 2018-09-12 NOTE — Procedures (Signed)
Lumbosacral Transforaminal Epidural Steroid Injection - Sub-Pedicular Approach with Fluoroscopic Guidance  Patient: Joanne Holmes      Date of Birth: Oct 15, 1945 MRN: [id number] PCP: Delilah Shan, MD      Visit Date: 08/28/2018   Universal Protocol:    Date/Time: 08/28/2018  Consent Given By: the patient  Position: PRONE  Additional Comments: Vital signs were monitored before and after the procedure. Patient was prepped and draped in the usual sterile fashion. The correct patient, procedure, and site was verified.   Injection Procedure Details:  Procedure Site One Meds Administered:  Meds ordered this encounter  Medications  . betamethasone acetate-betamethasone sodium phosphate (CELESTONE) injection 12 mg    Laterality: Left  Location/Site:  L5-S1  Needle size: 22 G  Needle type: Spinal  Needle Placement: Transforaminal  Findings:    -Comments: Excellent flow of contrast along the nerve and into the epidural space.  Procedure Details: After squaring off the end-plates to get a true AP view, the C-arm was positioned so that an oblique view of the foramen as noted above was visualized. The target area is just inferior to the "nose of the scotty dog" or sub pedicular. The soft tissues overlying this structure were infiltrated with 2-3 ml. of 1% Lidocaine without Epinephrine.  The spinal needle was inserted toward the target using a "trajectory" view along the fluoroscope beam.  Under AP and lateral visualization, the needle was advanced so it did not puncture dura and was located close the 6 O'Clock position of the pedical in AP tracterory. Biplanar projections were used to confirm position. Aspiration was confirmed to be negative for CSF and/or blood. A 1-2 ml. volume of Isovue-250 was injected and flow of contrast was noted at each level. Radiographs were obtained for documentation purposes.   After attaining the desired flow of contrast documented above, a 0.5 to  1.0 ml test dose of 0.25% Marcaine was injected into each respective transforaminal space.  The patient was observed for 90 seconds post injection.  After no sensory deficits were reported, and normal lower extremity motor function was noted,   the above injectate was administered so that equal amounts of the injectate were placed at each foramen (level) into the transforaminal epidural space.   Additional Comments:  The patient tolerated the procedure well Dressing: 2 x 2 sterile gauze and Band-Aid    Post-procedure details: Patient was observed during the procedure. Post-procedure instructions were reviewed.  Patient left the clinic in stable condition.

## 2018-09-12 NOTE — Progress Notes (Signed)
Joanne Holmes - 73 y.o. female MRN [id number]  Date of birth: 02-24-1946  Office Visit Note: Visit Date: 08/28/2018 PCP: Delilah Shan, MD Referred by: Delilah Shan, MD  Subjective: Chief Complaint  Patient presents with  . Lower Back - Pain   HPI:  Joanne Holmes is a 73 y.o. female who comes in today For planned repeat left L5 transforaminal epidural steroid injection.  Her case is somewhat difficult and that she has had prior laminectomy discectomy at L5-S1 by Dr. Sherwood Gambler and she had been seen in for quite some time for basically a series of interlaminar epidural injections which were fairly beneficial according to her report.  We have been seeing her more recently and have completed a transforaminal approach which was really beneficial the first time we tried it.  The last time I saw her was in January as she said the pain started to return 3 weeks postinjection but seem to last quite a while with some pain relief.  Her average pain is 5 out of 10.  She has severe multifactorial stenosis at L3-4 and L4-5 without much in the way of real radicular complaints is really more low back and hip pain.  She does use some medication with some help.  She has done well with prior carpal tunnel release we did complete her electrodiagnostic studies this year.  ROS Otherwise per HPI.  Assessment & Plan: Visit Diagnoses:  1. Lumbar radiculopathy   2. Spinal stenosis of lumbar region with neurogenic claudication     Plan: No additional findings.   Meds & Orders:  Meds ordered this encounter  Medications  . betamethasone acetate-betamethasone sodium phosphate (CELESTONE) injection 12 mg    Orders Placed This Encounter  Procedures  . XR C-ARM NO REPORT  . Epidural Steroid injection    Follow-up: No follow-ups on file.   Procedures: No procedures performed  Lumbosacral Transforaminal Epidural Steroid Injection - Sub-Pedicular Approach with Fluoroscopic Guidance  Patient: Joanne Holmes      Date of Birth: 10/18/1945 MRN: [id number] PCP: Delilah Shan, MD      Visit Date: 08/28/2018   Universal Protocol:    Date/Time: 08/28/2018  Consent Given By: the patient  Position: PRONE  Additional Comments: Vital signs were monitored before and after the procedure. Patient was prepped and draped in the usual sterile fashion. The correct patient, procedure, and site was verified.   Injection Procedure Details:  Procedure Site One Meds Administered:  Meds ordered this encounter  Medications  . betamethasone acetate-betamethasone sodium phosphate (CELESTONE) injection 12 mg    Laterality: Left  Location/Site:  L5-S1  Needle size: 22 G  Needle type: Spinal  Needle Placement: Transforaminal  Findings:    -Comments: Excellent flow of contrast along the nerve and into the epidural space.  Procedure Details: After squaring off the end-plates to get a true AP view, the C-arm was positioned so that an oblique view of the foramen as noted above was visualized. The target area is just inferior to the "nose of the scotty dog" or sub pedicular. The soft tissues overlying this structure were infiltrated with 2-3 ml. of 1% Lidocaine without Epinephrine.  The spinal needle was inserted toward the target using a "trajectory" view along the fluoroscope beam.  Under AP and lateral visualization, the needle was advanced so it did not puncture dura and was located close the 6 O'Clock position of the pedical in AP tracterory. Biplanar projections were used to confirm  position. Aspiration was confirmed to be negative for CSF and/or blood. A 1-2 ml. volume of Isovue-250 was injected and flow of contrast was noted at each level. Radiographs were obtained for documentation purposes.   After attaining the desired flow of contrast documented above, a 0.5 to 1.0 ml test dose of 0.25% Marcaine was injected into each respective transforaminal space.  The patient was observed for 90  seconds post injection.  After no sensory deficits were reported, and normal lower extremity motor function was noted,   the above injectate was administered so that equal amounts of the injectate were placed at each foramen (level) into the transforaminal epidural space.   Additional Comments:  The patient tolerated the procedure well Dressing: 2 x 2 sterile gauze and Band-Aid    Post-procedure details: Patient was observed during the procedure. Post-procedure instructions were reviewed.  Patient left the clinic in stable condition.     Clinical History: Lumbar spine MRI dated 03/01/2016  Alignment: 4 mm anterolisthesis L3-4 with mild progression since 2013. 6.6 mm anterolisthesis L4-5 unchanged. Mild retrolisthesis L1-2.  Vertebrae:  Negative for fracture or mass.  Normal bone marrow  Conus medullaris: Extends to the L1-2 level and appears normal.  Paraspinal and other soft tissues: No retroperitoneal mass or adenopathy. Paraspinous muscles are symmetric with mild atrophy.  Disc levels:  L1-2: Mild retrolisthesis. Disc bulging and mild facet degeneration without significant stenosis.  L2-3:  Mild disc and facet degeneration.  Mild spinal stenosis.  L3-4: Severe spinal stenosis which has progressed in the interval. There is anterior listhesis of L3 on L4. Diffuse disc bulging. Severe facet degeneration has progressed in the interval. There is marked subarticular stenosis bilaterally as well as moderate foraminal encroachment bilaterally.  L4-5: Severe spinal stenosis has progressed in the interval. Severe facet degeneration. Grade 1 anterior listhesis unchanged. Subarticular and foraminal encroachment bilaterally.  L5-S1: Severe disc degeneration with disc space narrowing and endplate spurring. Prior laminectomy on the left. Diffuse endplate scarring is present. There is soft tissue surrounding the left S1 nerve root which is unchanged most likely due to  scar tissue. No recurrent disc protrusion. Mild foraminal narrowing bilaterally due to spurring.     Objective:  VS:  HT:    WT:   BMI:     BP:(!) 154/78  HR:77bpm  TEMP: ( )  RESP:  Physical Exam  Ortho Exam Imaging: No results found.

## 2018-10-16 ENCOUNTER — Telehealth: Payer: Self-pay | Admitting: *Deleted

## 2018-10-17 NOTE — Telephone Encounter (Signed)
Not sure if went to you

## 2018-10-17 NOTE — Telephone Encounter (Signed)
Spoke with patient and she states pain is across the lower back only and that last injection helped out a lot. Will proceed with Bil L3 TF per Dr. Ernestina Patches.

## 2018-10-17 NOTE — Telephone Encounter (Signed)
Need to see how much it helped and if it is mainly left hip or low back pain. If mainly back pain and bilateral then bilat L3 TF esi. If confusing tell her I want OV to discuss.

## 2018-10-17 NOTE — Telephone Encounter (Signed)
Service [phone] Authorization Number:[id number] Auth Effective Date:07/23/2020Auth End Date:01/19/2021Initiated Date:07/23/2020Decision Date:07/23/2020Decision Type :InitialCase Status:Approved

## 2018-11-05 ENCOUNTER — Encounter: Payer: Self-pay | Admitting: Physical Medicine and Rehabilitation

## 2018-11-05 ENCOUNTER — Ambulatory Visit (INDEPENDENT_AMBULATORY_CARE_PROVIDER_SITE_OTHER): Payer: Medicare HMO | Admitting: Physical Medicine and Rehabilitation

## 2018-11-05 ENCOUNTER — Ambulatory Visit: Payer: Self-pay

## 2018-11-05 VITALS — BP 167/70 | HR 87

## 2018-11-05 DIAGNOSIS — M5416 Radiculopathy, lumbar region: Secondary | ICD-10-CM | POA: Diagnosis not present

## 2018-11-05 MED ORDER — BETAMETHASONE SOD PHOS & ACET 6 (3-3) MG/ML IJ SUSP
12.0000 mg | Freq: Once | INTRAMUSCULAR | Status: AC
  Administered 2018-11-05: 15:00:00 12 mg

## 2018-11-05 NOTE — Progress Notes (Signed)
 .  Numeric Pain Rating Scale and Functional Assessment Average Pain 5   In the last MONTH (on 0-10 scale) has pain interfered with the following?  1. General activity like being  able to carry out your everyday physical activities such as walking, climbing stairs, carrying groceries, or moving a chair?  Rating(6)   +Driver, -BT, -Dye Allergies.  

## 2018-11-17 IMAGING — CT CT ANGIO CHEST
2 of 8 series · 19 of 46 positions shown · IV contrast (OMNI)
Comparison: 08/16/2011 chest CT, CT abdomen 05/10/2014

CLINICAL DATA: One week of dyspnea. Treated for asthma exacerbation
with steroids.

EXAM:
CT ANGIOGRAPHY CHEST WITH CONTRAST
TECHNIQUE: Multidetector CT imaging of the chest was performed using the
standard protocol during bolus administration of intravenous
contrast. Multiplanar CT image reconstructions and MIPs were
obtained to evaluate the vascular anatomy.
CONTRAST:  65 cc Isovue 370 IV

[Series 6: thins · axial · 0.79mm/px · z∈[+1405,+1686]mm · 16 of 311 slices shown]
[im 15/311  lung]
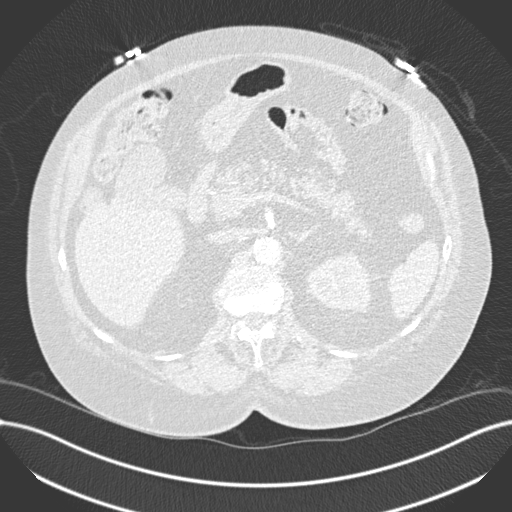
[im 29/311  soft-tissue]
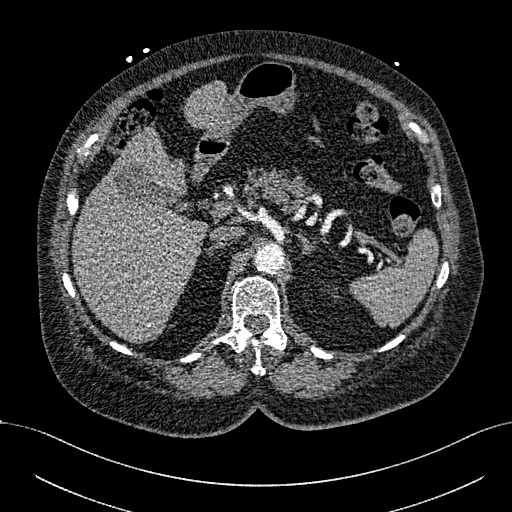
[im 57/311  lung]
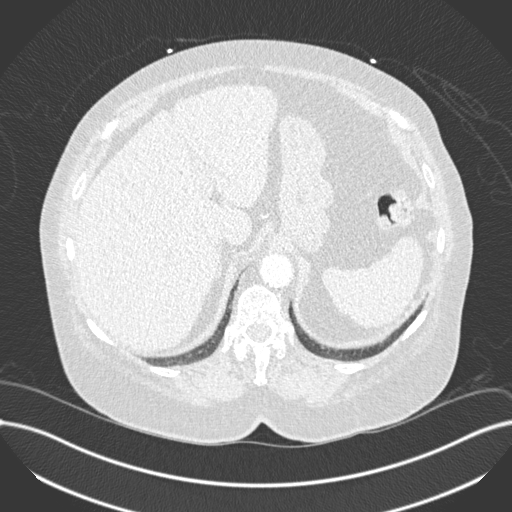
[im 71/311  soft-tissue]
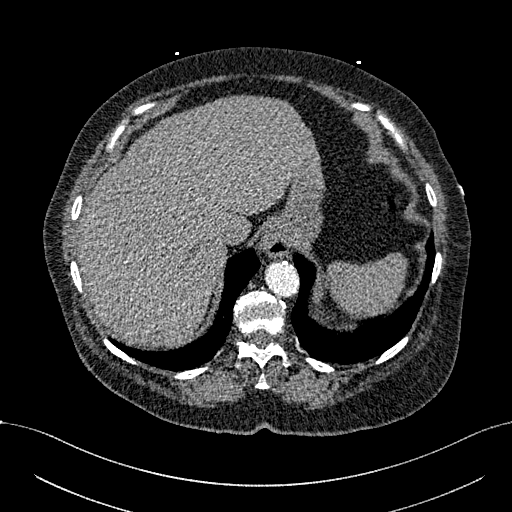
[im 85/311  lung]
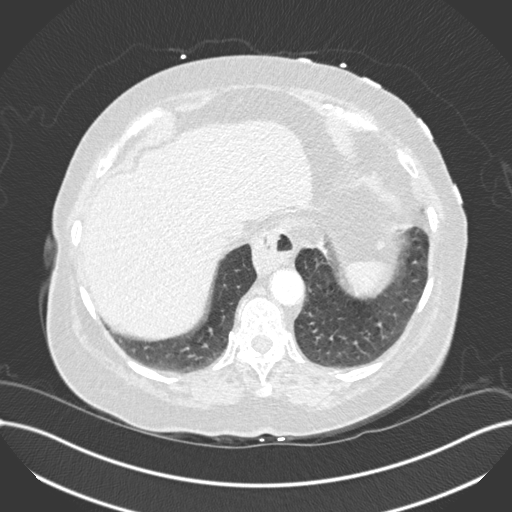
[im 113/311  soft-tissue]
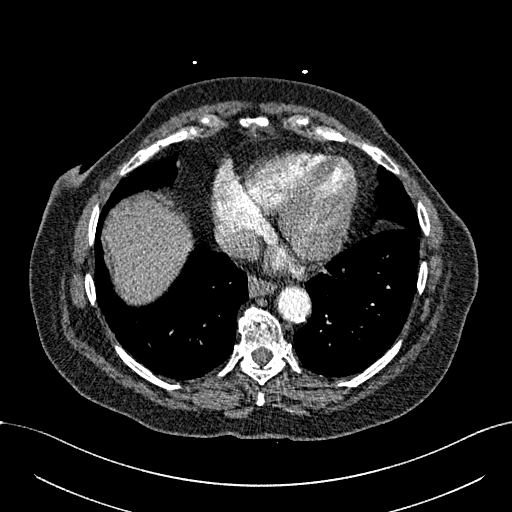
[im 127/311  lung]
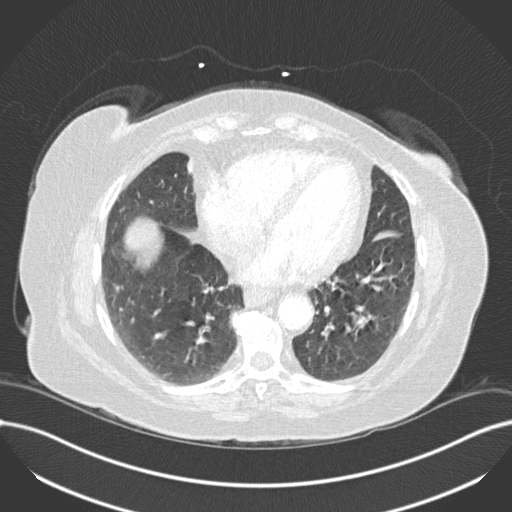
[im 141/311  soft-tissue]
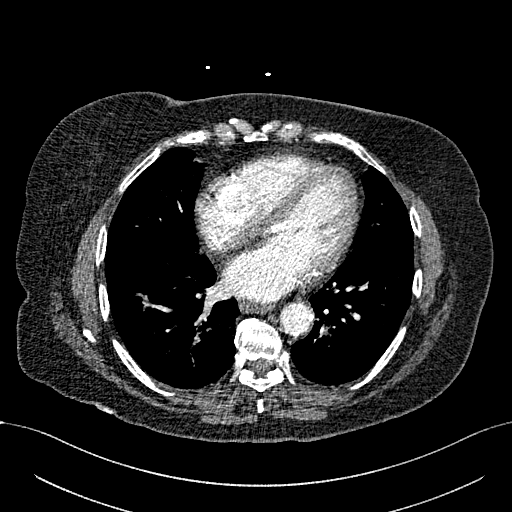
[im 170/311  lung]
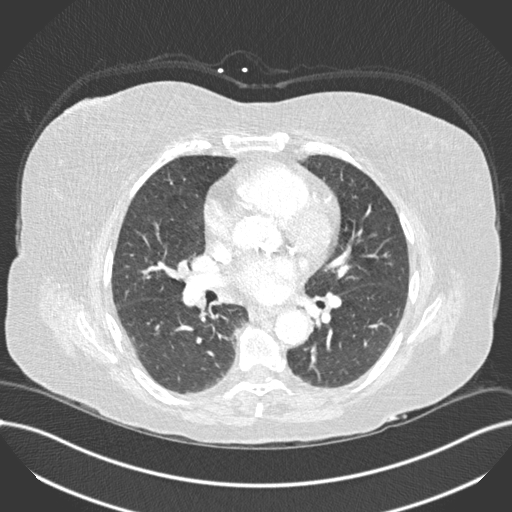
[im 184/311  soft-tissue]
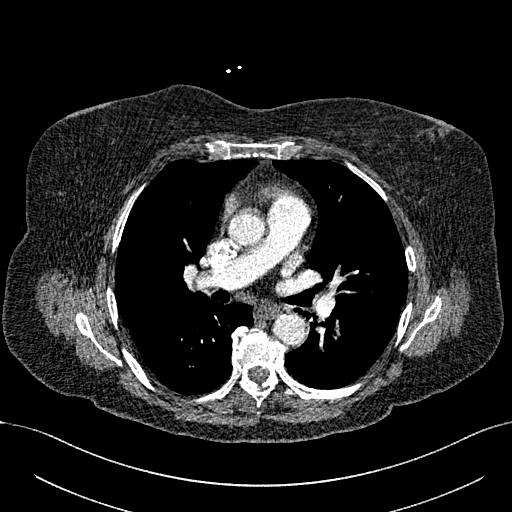
[im 198/311  lung]
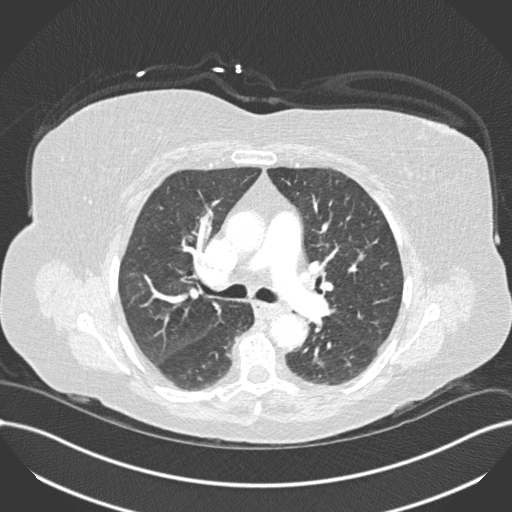
[im 226/311  soft-tissue]
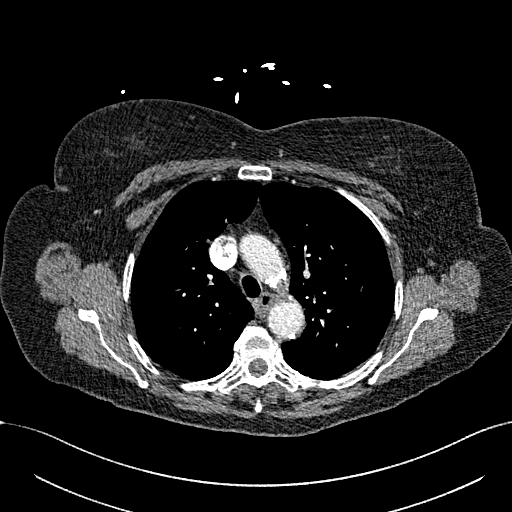
[im 240/311  lung]
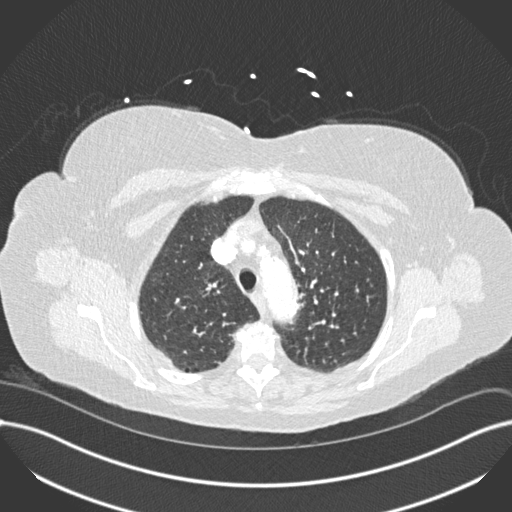
[im 254/311  soft-tissue]
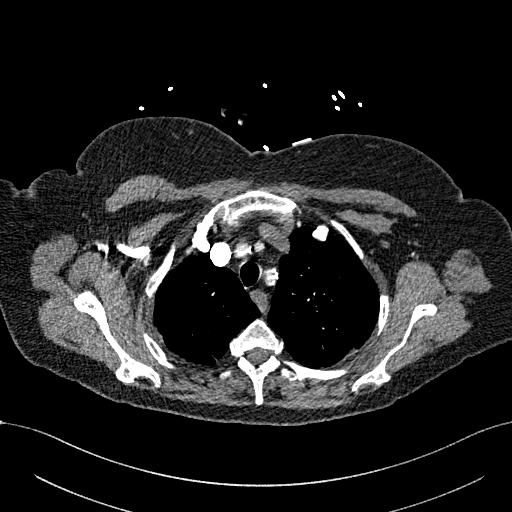
[im 282/311  lung]
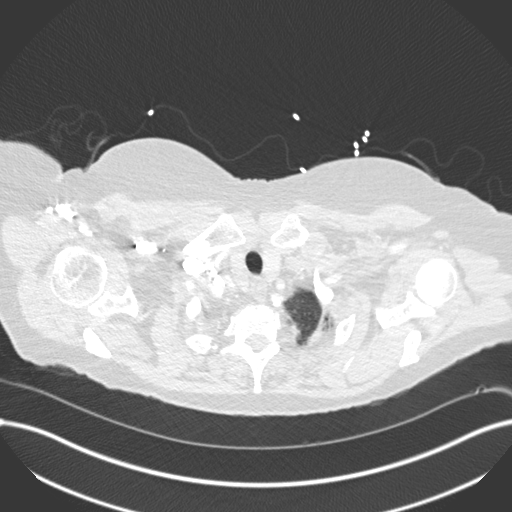
[im 296/311  soft-tissue]
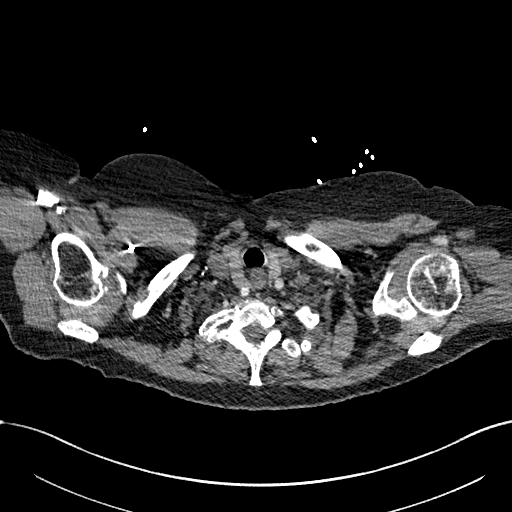

[Series 8: coronal mpr · coronal · 0.61mm/px · 3 of 151 slices shown]
[im 38/151  soft-tissue]
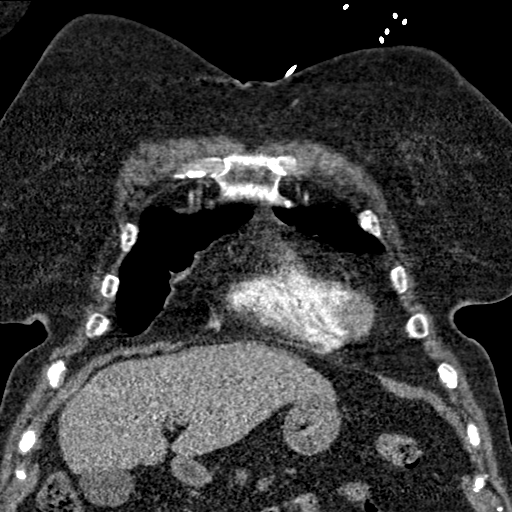
[im 76/151  soft-tissue]
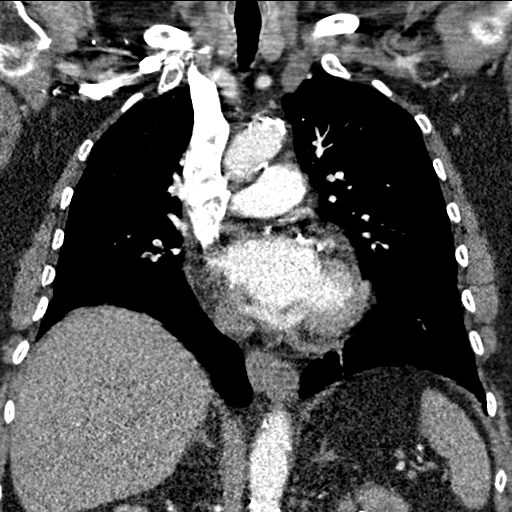
[im 113/151  soft-tissue]
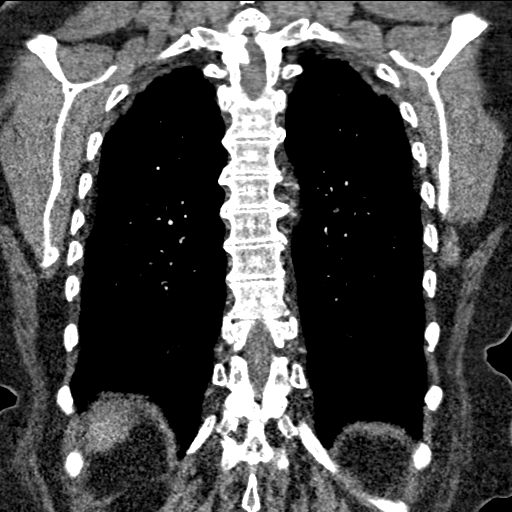

[19 of 46 positions shown; findings below may reference images not displayed]

FINDINGS: Cardiovascular: No acute pulmonary embolus. Coronary arterial,
aortic and great vessel atherosclerosis. Borderline cardiomegaly
without pericardial effusion or thickening no aortic aneurysm or
dissection is noted.

Mediastinum/Nodes: No enlarged mediastinal, hilar, or axillary lymph
nodes. Thyroid gland, trachea, and esophagus demonstrate no
significant findings.

Lungs/Pleura: No pneumothorax or pleural effusion. There is
dependent atelectasis focal at the right lung base. Tiny 4 mm left
lower lobe pulmonary nodule, stable since 3677 and consistent with a
benign finding.

Upper Abdomen: Fatty atrophy of the pancreas. Small hiatal hernia.
No acute intra-abdominal abnormality. No adrenal mass. No
splenomegaly. Slightly lobular appearance of the right upper pole of
the kidney, incompletely visualized but stable relative to 3677.

Musculoskeletal: Mild degenerative change again noted of the
included dorsal spine.

Review of the MIP images confirms the above findings.
IMPRESSION: 1. No acute pulmonary embolus.  No acute pulmonary disease.
2. Coronary arteriosclerosis and aortic atherosclerosis.
3. Stable 4 mm left lower lobe pulmonary nodule dating back to 3677
consistent with a benign finding.

Aortic Atherosclerosis ([id number]).

## 2018-12-16 NOTE — Progress Notes (Signed)
Joanne Holmes - 73 y.o. female MRN [id number]  Date of birth: Aug 08, 1945  Office Visit Note: Visit Date: 11/05/2018 PCP: Delilah Shan, MD Referred by: Delilah Shan, MD  Subjective: Chief Complaint  Patient presents with  . Lower Back - Pain   HPI:  Joanne Holmes is a 73 y.o. female who comes in today Returns today for planned repeat epidural injection.  Her case is very interesting that she has had prior lumbar laminectomy by Dr. Sherwood Gambler many years ago and had ongoing injections by him which were L5-S1 interlaminar injections.  She ended up seeing me because I actually was seeing her husband and Dr. Sherwood Gambler was not doing as many injections.  She would essentially have a series of 3 in some fashion.  All the notes we have can be reviewed.  Her case is interesting because updated MRI does show severe multifactorial stenosis at L3-4 and she does have bilateral lower back pain but without really radicular pain.  Prior L5 transforaminal injection just on the left side alone has actually given her relief for up to several months at a time and she reports really good relief.  At this point we have the same situation with the last injection in June was really very beneficial up until just recently.  She reports almost 100% relief.  I am hard pressed not to repeat that since she did fairly well but the next step at least on a trial basis would be a bilateral L3 transforaminal injection just to see if that does any better this would like to limit these injections to just a few times in a year.  She is in agreement with the plan.  She has had no new issues no falls no trauma no weakness no bowel or bladder changes no red flag complaints.  ROS Otherwise per HPI.  Assessment & Plan: Visit Diagnoses:  1. Lumbar radiculopathy     Plan: No additional findings.   Meds & Orders:  Meds ordered this encounter  Medications  . betamethasone acetate-betamethasone sodium phosphate (CELESTONE)  injection 12 mg    Orders Placed This Encounter  Procedures  . XR C-ARM NO REPORT  . Epidural Steroid injection    Follow-up: No follow-ups on file.   Procedures: No procedures performed  Lumbosacral Transforaminal Epidural Steroid Injection - Sub-Pedicular Approach with Fluoroscopic Guidance  Patient: Joanne Holmes      Date of Birth: 02-20-1946 MRN: [id number] PCP: Delilah Shan, MD      Visit Date: 11/05/2018   Universal Protocol:    Date/Time: 11/05/2018  Consent Given By: the patient  Position: PRONE  Additional Comments: Vital signs were monitored before and after the procedure. Patient was prepped and draped in the usual sterile fashion. The correct patient, procedure, and site was verified.   Injection Procedure Details:  Procedure Site One Meds Administered:  Meds ordered this encounter  Medications  . betamethasone acetate-betamethasone sodium phosphate (CELESTONE) injection 12 mg    Laterality: Left  Location/Site:  L5-S1  Needle size: 22 G  Needle type: Spinal  Needle Placement: Transforaminal  Findings:    -Comments: Excellent flow of contrast along the nerve and into the epidural space.  Procedure Details: After squaring off the end-plates to get a true AP view, the C-arm was positioned so that an oblique view of the foramen as noted above was visualized. The target area is just inferior to the "nose of the scotty dog" or sub pedicular. The soft  tissues overlying this structure were infiltrated with 2-3 ml. of 1% Lidocaine without Epinephrine.  The spinal needle was inserted toward the target using a "trajectory" view along the fluoroscope beam.  Under AP and lateral visualization, the needle was advanced so it did not puncture dura and was located close the 6 O'Clock position of the pedical in AP tracterory. Biplanar projections were used to confirm position. Aspiration was confirmed to be negative for CSF and/or blood. A 1-2 ml. volume of  Isovue-250 was injected and flow of contrast was noted at each level. Radiographs were obtained for documentation purposes.   After attaining the desired flow of contrast documented above, a 0.5 to 1.0 ml test dose of 0.25% Marcaine was injected into each respective transforaminal space.  The patient was observed for 90 seconds post injection.  After no sensory deficits were reported, and normal lower extremity motor function was noted,   the above injectate was administered so that equal amounts of the injectate were placed at each foramen (level) into the transforaminal epidural space.   Additional Comments:  The patient tolerated the procedure well Dressing: 2 x 2 sterile gauze and Band-Aid    Post-procedure details: Patient was observed during the procedure. Post-procedure instructions were reviewed.  Patient left the clinic in stable condition.    Clinical History: Lumbar spine MRI dated 03/01/2016  Alignment: 4 mm anterolisthesis L3-4 with mild progression since 2013. 6.6 mm anterolisthesis L4-5 unchanged. Mild retrolisthesis L1-2.  Vertebrae:  Negative for fracture or mass.  Normal bone marrow  Conus medullaris: Extends to the L1-2 level and appears normal.  Paraspinal and other soft tissues: No retroperitoneal mass or adenopathy. Paraspinous muscles are symmetric with mild atrophy.  Disc levels:  L1-2: Mild retrolisthesis. Disc bulging and mild facet degeneration without significant stenosis.  L2-3:  Mild disc and facet degeneration.  Mild spinal stenosis.  L3-4: Severe spinal stenosis which has progressed in the interval. There is anterior listhesis of L3 on L4. Diffuse disc bulging. Severe facet degeneration has progressed in the interval. There is marked subarticular stenosis bilaterally as well as moderate foraminal encroachment bilaterally.  L4-5: Severe spinal stenosis has progressed in the interval. Severe facet degeneration. Grade 1 anterior  listhesis unchanged. Subarticular and foraminal encroachment bilaterally.  L5-S1: Severe disc degeneration with disc space narrowing and endplate spurring. Prior laminectomy on the left. Diffuse endplate scarring is present. There is soft tissue surrounding the left S1 nerve root which is unchanged most likely due to scar tissue. No recurrent disc protrusion. Mild foraminal narrowing bilaterally due to spurring.     Objective:  VS:  HT:    WT:   BMI:     BP:(!) 167/70  HR:87bpm  TEMP: ( )  RESP:  Physical Exam  Ortho Exam Imaging: No results found.

## 2018-12-16 NOTE — Procedures (Signed)
Lumbosacral Transforaminal Epidural Steroid Injection - Sub-Pedicular Approach with Fluoroscopic Guidance  Patient: Joanne Holmes      Date of Birth: 04-28-1945 MRN: [id number] PCP: Delilah Shan, MD      Visit Date: 11/05/2018   Universal Protocol:    Date/Time: 11/05/2018  Consent Given By: the patient  Position: PRONE  Additional Comments: Vital signs were monitored before and after the procedure. Patient was prepped and draped in the usual sterile fashion. The correct patient, procedure, and site was verified.   Injection Procedure Details:  Procedure Site One Meds Administered:  Meds ordered this encounter  Medications  . betamethasone acetate-betamethasone sodium phosphate (CELESTONE) injection 12 mg    Laterality: Left  Location/Site:  L5-S1  Needle size: 22 G  Needle type: Spinal  Needle Placement: Transforaminal  Findings:    -Comments: Excellent flow of contrast along the nerve and into the epidural space.  Procedure Details: After squaring off the end-plates to get a true AP view, the C-arm was positioned so that an oblique view of the foramen as noted above was visualized. The target area is just inferior to the "nose of the scotty dog" or sub pedicular. The soft tissues overlying this structure were infiltrated with 2-3 ml. of 1% Lidocaine without Epinephrine.  The spinal needle was inserted toward the target using a "trajectory" view along the fluoroscope beam.  Under AP and lateral visualization, the needle was advanced so it did not puncture dura and was located close the 6 O'Clock position of the pedical in AP tracterory. Biplanar projections were used to confirm position. Aspiration was confirmed to be negative for CSF and/or blood. A 1-2 ml. volume of Isovue-250 was injected and flow of contrast was noted at each level. Radiographs were obtained for documentation purposes.   After attaining the desired flow of contrast documented above, a 0.5 to  1.0 ml test dose of 0.25% Marcaine was injected into each respective transforaminal space.  The patient was observed for 90 seconds post injection.  After no sensory deficits were reported, and normal lower extremity motor function was noted,   the above injectate was administered so that equal amounts of the injectate were placed at each foramen (level) into the transforaminal epidural space.   Additional Comments:  The patient tolerated the procedure well Dressing: 2 x 2 sterile gauze and Band-Aid    Post-procedure details: Patient was observed during the procedure. Post-procedure instructions were reviewed.  Patient left the clinic in stable condition.

## 2018-12-24 ENCOUNTER — Telehealth: Payer: Self-pay | Admitting: Physical Medicine and Rehabilitation

## 2018-12-24 NOTE — Telephone Encounter (Signed)
Left message #1

## 2018-12-24 NOTE — Telephone Encounter (Signed)
Bilateral L3 tf and we will discuss facet pain

## 2018-12-26 NOTE — Telephone Encounter (Signed)
Scheduled for 10/22. Is auth needed.

## 2018-12-26 NOTE — Telephone Encounter (Signed)
Service [id number] Authorization [id number] Auth Effective Date:10/01/2020Auth End Date:03/30/2021Initiated Date:10/01/2020Decision Date:10/01/2020Decision Type :InitialCase Status:Approved

## 2019-01-16 ENCOUNTER — Ambulatory Visit (INDEPENDENT_AMBULATORY_CARE_PROVIDER_SITE_OTHER): Payer: Medicare HMO | Admitting: Physical Medicine and Rehabilitation

## 2019-01-16 ENCOUNTER — Encounter: Payer: Self-pay | Admitting: Physical Medicine and Rehabilitation

## 2019-01-16 ENCOUNTER — Other Ambulatory Visit: Payer: Self-pay

## 2019-01-16 ENCOUNTER — Ambulatory Visit: Payer: Self-pay

## 2019-01-16 VITALS — BP 161/78 | HR 92

## 2019-01-16 DIAGNOSIS — M5416 Radiculopathy, lumbar region: Secondary | ICD-10-CM

## 2019-01-16 DIAGNOSIS — M48062 Spinal stenosis, lumbar region with neurogenic claudication: Secondary | ICD-10-CM

## 2019-01-16 MED ORDER — BETAMETHASONE SOD PHOS & ACET 6 (3-3) MG/ML IJ SUSP: 12.0000 mg | Freq: Once | INTRAMUSCULAR | Status: DC

## 2019-01-16 NOTE — Progress Notes (Signed)
 .  Numeric Pain Rating Scale and Functional Assessment Average Pain 5   In the last MONTH (on 0-10 scale) has pain interfered with the following?  1. General activity like being  able to carry out your everyday physical activities such as walking, climbing stairs, carrying groceries, or moving a chair?  Rating(6)   +Driver, -BT, -Dye Allergies.  

## 2019-01-30 ENCOUNTER — Telehealth: Payer: Self-pay | Admitting: Physical Medicine and Rehabilitation

## 2019-01-30 NOTE — Telephone Encounter (Signed)
AWESOME!

## 2019-02-18 NOTE — Procedures (Signed)
Lumbosacral Transforaminal Epidural Steroid Injection - Sub-Pedicular Approach with Fluoroscopic Guidance  Patient: Joanne Holmes      Date of Birth: [birth year] MRN: [id number] PCP: Delilah Shan, MD      Visit Date: 01/16/2019   Universal Protocol:    Date/Time: 01/16/2019  Consent Given By: the patient  Position: PRONE  Additional Comments: Vital signs were monitored before and after the procedure. Patient was prepped and draped in the usual sterile fashion. The correct patient, procedure, and site was verified.   Injection Procedure Details:  Procedure Site One Meds Administered:  Meds ordered this encounter  Medications  . betamethasone acetate-betamethasone sodium phosphate (CELESTONE) injection 12 mg    Laterality: Bilateral  Location/Site:  L3-L4  Needle size: 22 G  Needle type: Spinal  Needle Placement: Transforaminal  Findings:    -Comments: Excellent flow of contrast along the nerve and into the epidural space.  Procedure Details: After squaring off the end-plates to get a true AP view, the C-arm was positioned so that an oblique view of the foramen as noted above was visualized. The target area is just inferior to the "nose of the scotty dog" or sub pedicular. The soft tissues overlying this structure were infiltrated with 2-3 ml. of 1% Lidocaine without Epinephrine.  The spinal needle was inserted toward the target using a "trajectory" view along the fluoroscope beam.  Under AP and lateral visualization, the needle was advanced so it did not puncture dura and was located close the 6 O'Clock position of the pedical in AP tracterory. Biplanar projections were used to confirm position. Aspiration was confirmed to be negative for CSF and/or blood. A 1-2 ml. volume of Isovue-250 was injected and flow of contrast was noted at each level. Radiographs were obtained for documentation purposes.   After attaining the desired flow of contrast documented above, a  0.5 to 1.0 ml test dose of 0.25% Marcaine was injected into each respective transforaminal space.  The patient was observed for 90 seconds post injection.  After no sensory deficits were reported, and normal lower extremity motor function was noted,   the above injectate was administered so that equal amounts of the injectate were placed at each foramen (level) into the transforaminal epidural space.   Additional Comments:  The patient tolerated the procedure well Dressing: 2 x 2 sterile gauze and Band-Aid    Post-procedure details: Patient was observed during the procedure. Post-procedure instructions were reviewed.  Patient left the clinic in stable condition.

## 2019-02-18 NOTE — Progress Notes (Signed)
Joanne Holmes - 73 y.o. female MRN [id number]  Date of birth: 12/26/1945  Office Visit Note: Visit Date: 01/16/2019 PCP: Delilah Shan, MD Referred by: Delilah Shan, MD  Subjective: Chief Complaint  Patient presents with  . Lower Back - Pain   HPI:  Joanne Holmes is a 73 y.o. female who comes in today For planned bilateral L3 transforaminal epidural steroid injection.  Patient is interesting in that she has pretty severe stenosis at L3-4 somewhat less at L4-5 has been doing well in the past with Dr. Sherwood Gambler completing interlaminar injections at L5-S1 despite the fact that she had prior discectomy at this level.  We initially saw her and completed similar injections in this area with some relief.  We ultimately switched to transforaminal injection at L5 based on her symptoms and she would get decent relief at times but it seems like that symptoms will return after 4 to 5 weeks and just really not enough length of time.  Those notes can all be reviewed.  She has mostly low back pain without really any referral pain down the legs.  We have discussed this at length on many occasions and I think the next step is to try bilateral L3 transforaminal injections for the level of stenosis that is the worst.  Depending on how long or how well that works we would look at diagnostic medial branch blocks with goal towards radiofrequency ablation.  She has had no new symptoms since have seen her last just continued axial low back pain relieved with epidural injection for short while continues with current medications.  She does take hydrocodone on a semiregular basis through her primary care physician.  ROS Otherwise per HPI.  Assessment & Plan: Visit Diagnoses:  1. Lumbar radiculopathy   2. Spinal stenosis of lumbar region with neurogenic claudication     Plan: No additional findings.   Meds & Orders:  Meds ordered this encounter  Medications  . betamethasone acetate-betamethasone sodium  phosphate (CELESTONE) injection 12 mg    Orders Placed This Encounter  Procedures  . XR C-ARM NO REPORT  . Epidural Steroid injection    Follow-up: Return if symptoms worsen or fail to improve, for Consider medial branch blocks.   Procedures: No procedures performed  Lumbosacral Transforaminal Epidural Steroid Injection - Sub-Pedicular Approach with Fluoroscopic Guidance  Patient: Joanne Holmes      Date of Birth: 03/30/1945 MRN: [id number] PCP: Delilah Shan, MD      Visit Date: 01/16/2019   Universal Protocol:    Date/Time: 01/16/2019  Consent Given By: the patient  Position: PRONE  Additional Comments: Vital signs were monitored before and after the procedure. Patient was prepped and draped in the usual sterile fashion. The correct patient, procedure, and site was verified.   Injection Procedure Details:  Procedure Site One Meds Administered:  Meds ordered this encounter  Medications  . betamethasone acetate-betamethasone sodium phosphate (CELESTONE) injection 12 mg    Laterality: Bilateral  Location/Site:  L3-L4  Needle size: 22 G  Needle type: Spinal  Needle Placement: Transforaminal  Findings:    -Comments: Excellent flow of contrast along the nerve and into the epidural space.  Procedure Details: After squaring off the end-plates to get a true AP view, the C-arm was positioned so that an oblique view of the foramen as noted above was visualized. The target area is just inferior to the "nose of the scotty dog" or sub pedicular. The soft tissues overlying this  structure were infiltrated with 2-3 ml. of 1% Lidocaine without Epinephrine.  The spinal needle was inserted toward the target using a "trajectory" view along the fluoroscope beam.  Under AP and lateral visualization, the needle was advanced so it did not puncture dura and was located close the 6 O'Clock position of the pedical in AP tracterory. Biplanar projections were used to confirm  position. Aspiration was confirmed to be negative for CSF and/or blood. A 1-2 ml. volume of Isovue-250 was injected and flow of contrast was noted at each level. Radiographs were obtained for documentation purposes.   After attaining the desired flow of contrast documented above, a 0.5 to 1.0 ml test dose of 0.25% Marcaine was injected into each respective transforaminal space.  The patient was observed for 90 seconds post injection.  After no sensory deficits were reported, and normal lower extremity motor function was noted,   the above injectate was administered so that equal amounts of the injectate were placed at each foramen (level) into the transforaminal epidural space.   Additional Comments:  The patient tolerated the procedure well Dressing: 2 x 2 sterile gauze and Band-Aid    Post-procedure details: Patient was observed during the procedure. Post-procedure instructions were reviewed.  Patient left the clinic in stable condition.     Clinical History: Lumbar spine MRI dated 03/01/2016  Alignment: 4 mm anterolisthesis L3-4 with mild progression since 2013. 6.6 mm anterolisthesis L4-5 unchanged. Mild retrolisthesis L1-2.  Vertebrae:  Negative for fracture or mass.  Normal bone marrow  Conus medullaris: Extends to the L1-2 level and appears normal.  Paraspinal and other soft tissues: No retroperitoneal mass or adenopathy. Paraspinous muscles are symmetric with mild atrophy.  Disc levels:  L1-2: Mild retrolisthesis. Disc bulging and mild facet degeneration without significant stenosis.  L2-3:  Mild disc and facet degeneration.  Mild spinal stenosis.  L3-4: Severe spinal stenosis which has progressed in the interval. There is anterior listhesis of L3 on L4. Diffuse disc bulging. Severe facet degeneration has progressed in the interval. There is marked subarticular stenosis bilaterally as well as moderate foraminal encroachment bilaterally.  L4-5: Severe  spinal stenosis has progressed in the interval. Severe facet degeneration. Grade 1 anterior listhesis unchanged. Subarticular and foraminal encroachment bilaterally.  L5-S1: Severe disc degeneration with disc space narrowing and endplate spurring. Prior laminectomy on the left. Diffuse endplate scarring is present. There is soft tissue surrounding the left S1 nerve root which is unchanged most likely due to scar tissue. No recurrent disc protrusion. Mild foraminal narrowing bilaterally due to spurring.     Objective:  VS:  HT:    WT:   BMI:     BP:(!) 161/78  HR:92bpm  TEMP: ( )  RESP:  Physical Exam  Ortho Exam Imaging: No results found.

## 2019-05-02 ENCOUNTER — Telehealth: Payer: Self-pay

## 2019-05-02 NOTE — Telephone Encounter (Signed)
Patient called [facility] stating that she was doing very well since last injection until she had to have eye surgery twice and now is hurting. She would like a call back about getting an appt scheduled with Dr Ernestina Patches for injection  [phone]

## 2019-05-05 NOTE — Telephone Encounter (Signed)
Needs auth for [id number] [id number]. Scheduled for 2/18.

## 2019-05-05 NOTE — Telephone Encounter (Signed)
Ok to repeat bilateral L3 TF?

## 2019-05-05 NOTE — Telephone Encounter (Signed)
ok 

## 2019-05-06 NOTE — Telephone Encounter (Signed)
Service [phone] Authorization [id number] Auth Effective Date:02/09/2021Auth End Date:08/08/2021Initiated Date:02/09/2021Decision Date:02/09/2021Decision Type :InitialCase Status:Approved

## 2019-05-13 ENCOUNTER — Other Ambulatory Visit: Payer: Self-pay | Admitting: Family Medicine

## 2019-05-13 DIAGNOSIS — Z1231 Encounter for screening mammogram for malignant neoplasm of breast: Secondary | ICD-10-CM

## 2019-05-15 ENCOUNTER — Encounter: Payer: Medicare HMO | Admitting: Physical Medicine and Rehabilitation

## 2019-05-22 ENCOUNTER — Ambulatory Visit: Payer: Self-pay

## 2019-05-22 ENCOUNTER — Other Ambulatory Visit: Payer: Self-pay

## 2019-05-22 ENCOUNTER — Encounter: Payer: Self-pay | Admitting: Physical Medicine and Rehabilitation

## 2019-05-22 ENCOUNTER — Ambulatory Visit (INDEPENDENT_AMBULATORY_CARE_PROVIDER_SITE_OTHER): Payer: Medicare HMO | Admitting: Physical Medicine and Rehabilitation

## 2019-05-22 VITALS — BP 133/66 | HR 88

## 2019-05-22 DIAGNOSIS — N1832 Chronic kidney disease, stage 3b: Secondary | ICD-10-CM | POA: Insufficient documentation

## 2019-05-22 DIAGNOSIS — M48062 Spinal stenosis, lumbar region with neurogenic claudication: Secondary | ICD-10-CM

## 2019-05-22 DIAGNOSIS — M5416 Radiculopathy, lumbar region: Secondary | ICD-10-CM

## 2019-05-22 MED ORDER — METHYLPREDNISOLONE ACETATE 80 MG/ML IJ SUSP
40.0000 mg | Freq: Once | INTRAMUSCULAR | Status: AC
  Administered 2019-05-22: 13:00:00 40 mg

## 2019-05-22 NOTE — Progress Notes (Signed)
  Numeric Pain Rating Scale and Functional Assessment Average Pain (5)   In the last MONTH (on 0-10 scale) has pain interfered with the following?  1. General activity like being  able to carry out your everyday physical activities such as walking, climbing stairs, carrying groceries, or moving a chair?  Rating(5)   +Driver, -BT, -Dye Allergies.  

## 2019-05-27 NOTE — Progress Notes (Signed)
Joanne Holmes - 74 y.o. female MRN [id number]  Date of birth: [birth year]  Office Visit Note: Visit Date: 05/22/2019 PCP: Delilah Shan, MD Referred by: Delilah Shan, MD  Subjective: Chief Complaint  Patient presents with  . Lower Back - Pain   HPI:  Joanne Holmes is a 74 y.o. female who comes in today For planned repeat bilateral L3 transforaminal epidural steroid injection.  Patient has pretty severe stenosis at L3-4 and L4-5.  Prior laminectomy and decompression by Dr. Sherwood Gambler.  She reports last injection was prior to the best injection she has had.  She was doing well for a couple of months and then the pain has slowly returned.  She has had no new issues falls or trauma.  Still low back pain predominantly.  No real radicular pain.  Diagnostically did not do as well with facet joint blocks.  Does still take some pain medication.  The patient has failed conservative care including home exercise, medications, time and activity modification.  This injection will be diagnostic and hopefully therapeutic.  Please see requesting physician notes for further details and justification.   ROS Otherwise per HPI.  Assessment & Plan: Visit Diagnoses:  1. Spinal stenosis of lumbar region with neurogenic claudication   2. Lumbar radiculopathy     Plan: No additional findings.   Meds & Orders:  Meds ordered this encounter  Medications  . methylPREDNISolone acetate (DEPO-MEDROL) injection 40 mg    Orders Placed This Encounter  Procedures  . XR C-ARM NO REPORT  . Epidural Steroid injection    Follow-up: Return if symptoms worsen or fail to improve.   Procedures: No procedures performed  Lumbosacral Transforaminal Epidural Steroid Injection - Sub-Pedicular Approach with Fluoroscopic Guidance  Patient: Joanne Holmes      Date of Birth: 01-27-1946 MRN: [id number] PCP: Delilah Shan, MD      Visit Date: 05/22/2019   Universal Protocol:    Date/Time:  05/22/2019  Consent Given By: the patient  Position: PRONE  Additional Comments: Vital signs were monitored before and after the procedure. Patient was prepped and draped in the usual sterile fashion. The correct patient, procedure, and site was verified.   Injection Procedure Details:  Procedure Site One Meds Administered:  Meds ordered this encounter  Medications  . methylPREDNISolone acetate (DEPO-MEDROL) injection 40 mg    Laterality: Bilateral  Location/Site:  L3-L4  Needle size: 22 G  Needle type: Spinal  Needle Placement: Transforaminal  Findings:    -Comments: Excellent flow of contrast along the nerve and into the epidural space.  Procedure Details: After squaring off the end-plates to get a true AP view, the C-arm was positioned so that an oblique view of the foramen as noted above was visualized. The target area is just inferior to the "nose of the scotty dog" or sub pedicular. The soft tissues overlying this structure were infiltrated with 2-3 ml. of 1% Lidocaine without Epinephrine.  The spinal needle was inserted toward the target using a "trajectory" view along the fluoroscope beam.  Under AP and lateral visualization, the needle was advanced so it did not puncture dura and was located close the 6 O'Clock position of the pedical in AP tracterory. Biplanar projections were used to confirm position. Aspiration was confirmed to be negative for CSF and/or blood. A 1-2 ml. volume of Isovue-250 was injected and flow of contrast was noted at each level. Radiographs were obtained for documentation purposes.   After attaining the desired  flow of contrast documented above, a 0.5 to 1.0 ml test dose of 0.25% Marcaine was injected into each respective transforaminal space.  The patient was observed for 90 seconds post injection.  After no sensory deficits were reported, and normal lower extremity motor function was noted,   the above injectate was administered so that equal  amounts of the injectate were placed at each foramen (level) into the transforaminal epidural space.   Additional Comments:  The patient tolerated the procedure well Dressing: 2 x 2 sterile gauze and Band-Aid    Post-procedure details: Patient was observed during the procedure. Post-procedure instructions were reviewed.  Patient left the clinic in stable condition.      Clinical History: Lumbar spine MRI dated 03/01/2016  Alignment: 4 mm anterolisthesis L3-4 with mild progression since 2013. 6.6 mm anterolisthesis L4-5 unchanged. Mild retrolisthesis L1-2.  Vertebrae:  Negative for fracture or mass.  Normal bone marrow  Conus medullaris: Extends to the L1-2 level and appears normal.  Paraspinal and other soft tissues: No retroperitoneal mass or adenopathy. Paraspinous muscles are symmetric with mild atrophy.  Disc levels:  L1-2: Mild retrolisthesis. Disc bulging and mild facet degeneration without significant stenosis.  L2-3:  Mild disc and facet degeneration.  Mild spinal stenosis.  L3-4: Severe spinal stenosis which has progressed in the interval. There is anterior listhesis of L3 on L4. Diffuse disc bulging. Severe facet degeneration has progressed in the interval. There is marked subarticular stenosis bilaterally as well as moderate foraminal encroachment bilaterally.  L4-5: Severe spinal stenosis has progressed in the interval. Severe facet degeneration. Grade 1 anterior listhesis unchanged. Subarticular and foraminal encroachment bilaterally.  L5-S1: Severe disc degeneration with disc space narrowing and endplate spurring. Prior laminectomy on the left. Diffuse endplate scarring is present. There is soft tissue surrounding the left S1 nerve root which is unchanged most likely due to scar tissue. No recurrent disc protrusion. Mild foraminal narrowing bilaterally due to spurring.     Objective:  VS:  HT:    WT:   BMI:     BP:133/66  HR:88bpm   TEMP: ( )  RESP:  Physical Exam  Ortho Exam Imaging: No results found.

## 2019-05-27 NOTE — Procedures (Signed)
Lumbosacral Transforaminal Epidural Steroid Injection - Sub-Pedicular Approach with Fluoroscopic Guidance  Patient: Joanne Holmes      Date of Birth: 02-Mar-1946 MRN: [id number] PCP: Delilah Shan, MD      Visit Date: 05/22/2019   Universal Protocol:    Date/Time: 05/22/2019  Consent Given By: the patient  Position: PRONE  Additional Comments: Vital signs were monitored before and after the procedure. Patient was prepped and draped in the usual sterile fashion. The correct patient, procedure, and site was verified.   Injection Procedure Details:  Procedure Site One Meds Administered:  Meds ordered this encounter  Medications  . methylPREDNISolone acetate (DEPO-MEDROL) injection 40 mg    Laterality: Bilateral  Location/Site:  L3-L4  Needle size: 22 G  Needle type: Spinal  Needle Placement: Transforaminal  Findings:    -Comments: Excellent flow of contrast along the nerve and into the epidural space.  Procedure Details: After squaring off the end-plates to get a true AP view, the C-arm was positioned so that an oblique view of the foramen as noted above was visualized. The target area is just inferior to the "nose of the scotty dog" or sub pedicular. The soft tissues overlying this structure were infiltrated with 2-3 ml. of 1% Lidocaine without Epinephrine.  The spinal needle was inserted toward the target using a "trajectory" view along the fluoroscope beam.  Under AP and lateral visualization, the needle was advanced so it did not puncture dura and was located close the 6 O'Clock position of the pedical in AP tracterory. Biplanar projections were used to confirm position. Aspiration was confirmed to be negative for CSF and/or blood. A 1-2 ml. volume of Isovue-250 was injected and flow of contrast was noted at each level. Radiographs were obtained for documentation purposes.   After attaining the desired flow of contrast documented above, a 0.5 to 1.0 ml test dose  of 0.25% Marcaine was injected into each respective transforaminal space.  The patient was observed for 90 seconds post injection.  After no sensory deficits were reported, and normal lower extremity motor function was noted,   the above injectate was administered so that equal amounts of the injectate were placed at each foramen (level) into the transforaminal epidural space.   Additional Comments:  The patient tolerated the procedure well Dressing: 2 x 2 sterile gauze and Band-Aid    Post-procedure details: Patient was observed during the procedure. Post-procedure instructions were reviewed.  Patient left the clinic in stable condition.

## 2019-06-05 ENCOUNTER — Ambulatory Visit: Payer: Medicare HMO | Attending: Internal Medicine

## 2019-06-05 DIAGNOSIS — Z23 Encounter for immunization: Secondary | ICD-10-CM

## 2019-06-05 NOTE — Progress Notes (Signed)
   Covid-19 Vaccination Clinic  Name:  Joanne Holmes    MRN: [id number] DOB: Sep 21, 1945  06/05/2019  Joanne Holmes was observed post Covid-19 immunization for 15 minutes without incident. She was provided with Vaccine Information Sheet and instruction to access the V-Safe system.   Joanne Holmes was instructed to call 911 with any severe reactions post vaccine: Marland Kitchen Difficulty breathing  . Swelling of face and throat  . A fast heartbeat  . A bad rash all over body  . Dizziness and weakness   Immunizations Administered    Name Date Dose VIS Date Route   Pfizer COVID-19 Vaccine 06/05/2019 12:07 PM 0.3 mL 03/07/2019 Intramuscular   Manufacturer: [place]   Lot: [id number]   [place]: [id number]

## 2019-06-11 ENCOUNTER — Ambulatory Visit
Admission: RE | Admit: 2019-06-11 | Discharge: 2019-06-11 | Disposition: A | Discharge status: Home / Self Care | Payer: Medicare HMO | Source: Ambulatory Visit | Attending: Family Medicine | Admitting: Family Medicine

## 2019-06-11 ENCOUNTER — Other Ambulatory Visit: Payer: Self-pay

## 2019-06-11 DIAGNOSIS — Z1231 Encounter for screening mammogram for malignant neoplasm of breast: Secondary | ICD-10-CM

## 2019-06-20 DIAGNOSIS — R7989 Other specified abnormal findings of blood chemistry: Secondary | ICD-10-CM | POA: Insufficient documentation

## 2019-07-01 ENCOUNTER — Ambulatory Visit: Payer: Medicare HMO | Attending: Internal Medicine

## 2019-07-01 DIAGNOSIS — Z23 Encounter for immunization: Secondary | ICD-10-CM

## 2019-07-01 NOTE — Progress Notes (Signed)
   Covid-19 Vaccination Clinic  Name:  Joanne Holmes    MRN: [id number] DOB: 16-Dec-1945  07/01/2019  Joanne Holmes was observed post Covid-19 immunization for 15 minutes without incident. She was provided with Vaccine Information Sheet and instruction to access the V-Safe system.   Joanne Holmes was instructed to call 911 with any severe reactions post vaccine: Marland Kitchen Difficulty breathing  . Swelling of face and throat  . A fast heartbeat  . A bad rash all over body  . Dizziness and weakness   Immunizations Administered    Name Date Dose VIS Date Route   Pfizer COVID-19 Vaccine 07/01/2019  8:21 AM 0.3 mL 03/07/2019 Intramuscular   Manufacturer: [place]   Lot: [id number]   [place]: [id number]

## 2019-11-21 ENCOUNTER — Encounter: Payer: Self-pay | Admitting: Physical Medicine & Rehabilitation

## 2019-11-28 ENCOUNTER — Encounter: Payer: Medicare HMO | Attending: Physical Medicine & Rehabilitation | Admitting: Physical Medicine & Rehabilitation

## 2019-11-28 ENCOUNTER — Other Ambulatory Visit: Payer: Self-pay

## 2019-11-28 ENCOUNTER — Encounter: Payer: Self-pay | Admitting: Physical Medicine & Rehabilitation

## 2019-11-28 VITALS — BP 192/80 | HR 89 | Temp 98.1°F | Ht 62.0 in | Wt 194.0 lb

## 2019-11-28 DIAGNOSIS — M961 Postlaminectomy syndrome, not elsewhere classified: Secondary | ICD-10-CM | POA: Insufficient documentation

## 2019-11-28 DIAGNOSIS — Z7901 Long term (current) use of anticoagulants: Secondary | ICD-10-CM | POA: Diagnosis present

## 2019-11-28 DIAGNOSIS — M48062 Spinal stenosis, lumbar region with neurogenic claudication: Secondary | ICD-10-CM | POA: Diagnosis not present

## 2019-11-28 DIAGNOSIS — G894 Chronic pain syndrome: Secondary | ICD-10-CM | POA: Diagnosis not present

## 2019-11-28 DIAGNOSIS — Z5181 Encounter for therapeutic drug level monitoring: Secondary | ICD-10-CM | POA: Insufficient documentation

## 2019-11-28 NOTE — Patient Instructions (Signed)

## 2019-11-28 NOTE — Progress Notes (Signed)
Subjective:    Patient ID: Joanne Holmes, female    DOB: 1945-08-28, 74 y.o.   MRN: [id number]  HPI CC:  Bilateral back and Hip pain  74 year old female with history of hypertension and asthma as well as chronic low back pain referred by her PCP for physical medicine rehabilitation evaluation. Onset of pain >39yrs ago, had lumbar laminectomy L5-S1 in her 21s, has seen Dr Sherwood Gambler in past but never had surgery.  Had regular injections in lumbar area which helped for several months but had too much prednisone because of oral steroids used for COPD. The patient had L5-S1 injections performed by Dr. Sherwood Gambler No pain going down the leg.  Pt tries to stretch every morning , tries to do walking on a regular basis.  No longer golfs  Has been followed by Dr Laurence Spates from physical medicine rehabilitation who has been performing L3-L4 transforaminal epidural steroid injection last performed on 05/22/2019. According to patient he has discussed radiofrequency neurotomy as well.  Takes Hydrocodone 5mg  TID with Tramadol 100mg  TID  Independent with ADLs, Needs help with cleaning but can do dusting.  Does her own laundry   Needs stool softener for constipation, does not describe any other side effects from her pain medication such as dizziness or nausea or falls.  Patient has elevated blood pressure in the office today she states she normally has whitecoat syndrome and has been running in the 696 systolic range at home pain Inventory Average Pain 10 Pain Right Now 2 My pain is constant and aching  In the last 24 hours, has pain interfered with the following? General activity 10 Relation with others 0 Enjoyment of life 8 What TIME of day is your pain at its worst? daytime Sleep (in general) Good  Pain is worse with: walking and some activites Pain improves with: rest, pacing activities, medication and injections Relief from Meds: 4  walk without assistance how many minutes can you walk? 5  mins ability to climb steps?  yes do you drive?  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  no NEW PATIENT  New Patient    Family History  Problem Relation Age of Onset  . CAD Mother   . Cancer Father        lymphosarcoma  . Breast cancer Neg Hx    Social History   Socioeconomic History  . Marital status: Married    Spouse name: Not on file  . Number of children: Not on file  . Years of education: Not on file  . Highest education level: Not on file  Occupational History  . Not on file  Tobacco Use  . Smoking status: Former Smoker    Packs/day: 2.00    Years: 20.00    Pack years: 40.00    Types: Cigarettes    Quit date: 03/06/1981    Years since quitting: 38.7  . Smokeless tobacco: Never Used  Vaping Use  . Vaping Use: Never used  Substance and Sexual Activity  . Alcohol use: No  . Drug use: No  . Sexual activity: Not Currently  Other Topics Concern  . Not on file  Social History Narrative  . Not on file   Social Determinants of Health   Financial Resource Strain:   . Difficulty of Paying Living Expenses: Not on file  Food Insecurity:   . Worried About [employer] of  Food in the Last Year: Not on file  . Ran Out of Food in the Last Year: Not on file  Transportation Needs:   . Lack of Transportation (Medical): Not on file  . Lack of Transportation (Non-Medical): Not on file  Physical Activity:   . Days of Exercise per Week: Not on file  . Minutes of Exercise per Session: Not on file  Stress:   . Feeling of Stress : Not on file  Social Connections:   . Frequency of Communication with Friends and Family: Not on file  . Frequency of Social Gatherings with Friends and Family: Not on file  . Attends Religious Services: Not on file  . Active Member of Clubs or Organizations: Not on file  . Attends Archivist Meetings: Not on file  . Marital  Status: Not on file   Past Surgical History:  Procedure Laterality Date  . ABDOMINAL HYSTERECTOMY    . BACK SURGERY  25+ yrs ago   lumbar laminectomy L5-S1  . BLADDER SUSPENSION  ~10 yrs ago  . CARPAL TUNNEL RELEASE  03/08/2012   Procedure: CARPAL TUNNEL RELEASE;  Surgeon: Cammie Sickle., MD;  Location: [facility];  Service: Orthopedics;  Laterality: Left;  . SALPINGOOPHORECTOMY  ~8 yrs ago  . TONSILLECTOMY     Past Medical History:  Diagnosis Date  . Allergy   . Asthma   . COPD (chronic obstructive pulmonary disease) ([place])   . Hypertension   . Kidney stone   . Neuromuscular disorder ([facility])    carpal tunnel- left  . Pneumonia   . Spinal stenosis    BP (!) 192/80 Comment: per pat white coat syndrome  Pulse 89   Temp 98.1 F (36.7 C)   Ht 5\' 2"  (1.575 m)   Wt 194 lb (88 kg)   SpO2 95%   BMI 35.48 kg/m   Opioid Risk Score:   Fall Risk Score:  `1  Depression screen PHQ 2/9  No flowsheet data found. Review of Systems  Constitutional: Negative.   HENT: Negative.   Eyes: Negative.   Respiratory: Negative.   Cardiovascular: Negative.   Gastrointestinal: Negative.   Endocrine: Negative.   Genitourinary: Positive for urgency.  Musculoskeletal: Positive for back pain and gait problem.  Skin: Negative.   Allergic/Immunologic: Negative.   Hematological: Negative.   Psychiatric/Behavioral: Negative.   All other systems reviewed and are negative.      Objective:   Physical Exam Vitals and nursing note reviewed.  Constitutional:      Appearance: She is obese.  HENT:     Head: Normocephalic and atraumatic.  Eyes:     Extraocular Movements: Extraocular movements intact.     Conjunctiva/sclera: Conjunctivae normal.     Pupils: Pupils are equal, round, and reactive to light.  Cardiovascular:     Rate and Rhythm: Normal rate and regular rhythm.     Heart sounds: Normal heart sounds. No murmur heard.   Pulmonary:     Effort: Pulmonary effort is  normal. No respiratory distress.     Breath sounds: Normal breath sounds. No stridor. No wheezing.  Abdominal:     General: Abdomen is flat. Bowel sounds are normal. There is no distension.     Palpations: Abdomen is soft. There is no mass.  Musculoskeletal:        General: No swelling or tenderness.     Cervical back: Normal range of motion.  Skin:    General: Skin is warm and  dry.     Coloration: Skin is not jaundiced or pale.  Neurological:     Mental Status: She is alert and oriented to person, place, and time.     Cranial Nerves: No dysarthria.     Sensory: Sensation is intact.     Motor: Motor function is intact.     Coordination: Coordination is intact.     Gait: Gait is intact.     Comments: Motor strength is 5/5 bilateral deltoid bicep tricep grip hip flexion knee extension ankle dorsiflexion plantarflexion.  Psychiatric:        Attention and Perception: Attention and perception normal.        Mood and Affect: Mood and affect normal.        Speech: Speech normal.        Behavior: Behavior normal.        Thought Content: Thought content normal.        Cognition and Memory: Cognition normal.        Judgment: Judgment normal.    Lumbar spine has mild tenderness palpation lumbosacral junction. Her range of motion is reduced 50% of normal with flexion extension lateral bending and rotation.       Assessment & Plan:  #1. Lumbar postlaminectomy syndrome with chronic pain. The patient has no focal neurologic signs to indicate radiculopathy. She does not have any significant neurogenic claudication symptoms. She has been seeing physical medicine and rehabilitation, [facility]. She will continue to follow with him. I reviewed her current analgesic medications they are adequately controlling her pain without any untoward side effects. She will continue to follow-up with family medicine for these.  We discussed that as she gets older she may need to  reduce the dosing. I also gave her some backs exercises to do.  I will see the patient back on a as needed basis

## 2019-12-02 LAB — DRUG TOX MONITOR 1 W/CONF, ORAL FLD
Amphetamines: NEGATIVE ng/mL (ref <10)
Barbiturates: NEGATIVE ng/mL (ref <10)
Benzodiazepines: NEGATIVE ng/mL (ref <0.50)
Buprenorphine: NEGATIVE ng/mL (ref <0.10)
Cocaine: NEGATIVE ng/mL (ref <5.0)
Codeine: NEGATIVE ng/mL (ref <2.5)
Dihydrocodeine: 5.6 ng/mL — ABNORMAL HIGH (ref <2.5)
Fentanyl: NEGATIVE ng/mL (ref <0.10)
Heroin Metabolite: NEGATIVE ng/mL (ref <1.0)
Hydrocodone: 15.4 ng/mL — ABNORMAL HIGH (ref <2.5)
Hydromorphone: NEGATIVE ng/mL (ref <2.5)
MARIJUANA: NEGATIVE ng/mL (ref <2.5)
MDMA: NEGATIVE ng/mL (ref <10)
Meprobamate: NEGATIVE ng/mL (ref <2.5)
Methadone: NEGATIVE ng/mL (ref <5.0)
Morphine: NEGATIVE ng/mL (ref <2.5)
Nicotine Metabolite: NEGATIVE ng/mL (ref <5.0)
Norhydrocodone: 5.5 ng/mL — ABNORMAL HIGH (ref <2.5)
Noroxycodone: NEGATIVE ng/mL (ref <2.5)
Opiates: POSITIVE ng/mL — AB (ref <2.5)
Oxycodone: NEGATIVE ng/mL (ref <2.5)
Oxymorphone: NEGATIVE ng/mL (ref <2.5)
Phencyclidine: NEGATIVE ng/mL (ref <10)
Tapentadol: NEGATIVE ng/mL (ref <5.0)
Tramadol: 485.4 ng/mL — ABNORMAL HIGH (ref <5.0)
Tramadol: POSITIVE ng/mL — AB (ref <5.0)
Zolpidem: NEGATIVE ng/mL (ref <5.0)

## 2019-12-02 LAB — DRUG TOX ALC METAB W/CON, ORAL FLD: Alcohol Metabolite: NEGATIVE ng/mL (ref <25)

## 2019-12-10 ENCOUNTER — Telehealth: Payer: Self-pay | Admitting: *Deleted

## 2019-12-10 NOTE — Telephone Encounter (Signed)
Oral swab drug screen was consistent for prescribed medications.  ?

## 2019-12-12 NOTE — Progress Notes (Signed)
Consistent with reported medications

## 2020-05-20 DIAGNOSIS — N1832 Chronic kidney disease, stage 3b: Secondary | ICD-10-CM | POA: Diagnosis not present

## 2020-05-20 DIAGNOSIS — H6123 Impacted cerumen, bilateral: Secondary | ICD-10-CM | POA: Diagnosis not present

## 2020-05-20 DIAGNOSIS — Z0000 Encounter for general adult medical examination without abnormal findings: Secondary | ICD-10-CM | POA: Diagnosis not present

## 2020-05-20 DIAGNOSIS — E7800 Pure hypercholesterolemia, unspecified: Secondary | ICD-10-CM | POA: Diagnosis not present

## 2020-05-20 DIAGNOSIS — Z23 Encounter for immunization: Secondary | ICD-10-CM | POA: Diagnosis not present

## 2020-05-20 DIAGNOSIS — J449 Chronic obstructive pulmonary disease, unspecified: Secondary | ICD-10-CM | POA: Diagnosis not present

## 2020-05-20 DIAGNOSIS — M48062 Spinal stenosis, lumbar region with neurogenic claudication: Secondary | ICD-10-CM | POA: Diagnosis not present

## 2020-05-20 DIAGNOSIS — H6592 Unspecified nonsuppurative otitis media, left ear: Secondary | ICD-10-CM | POA: Diagnosis not present

## 2020-05-20 DIAGNOSIS — I10 Essential (primary) hypertension: Secondary | ICD-10-CM | POA: Diagnosis not present

## 2020-05-21 DIAGNOSIS — I10 Essential (primary) hypertension: Secondary | ICD-10-CM | POA: Diagnosis not present

## 2020-05-21 DIAGNOSIS — E7800 Pure hypercholesterolemia, unspecified: Secondary | ICD-10-CM | POA: Diagnosis not present

## 2020-05-21 DIAGNOSIS — Z1159 Encounter for screening for other viral diseases: Secondary | ICD-10-CM | POA: Diagnosis not present

## 2020-06-15 ENCOUNTER — Other Ambulatory Visit: Payer: Self-pay | Admitting: Family Medicine

## 2020-06-15 DIAGNOSIS — Z1231 Encounter for screening mammogram for malignant neoplasm of breast: Secondary | ICD-10-CM

## 2020-06-24 DIAGNOSIS — J302 Other seasonal allergic rhinitis: Secondary | ICD-10-CM | POA: Diagnosis not present

## 2020-06-24 DIAGNOSIS — H6592 Unspecified nonsuppurative otitis media, left ear: Secondary | ICD-10-CM | POA: Diagnosis not present

## 2020-06-24 DIAGNOSIS — H9202 Otalgia, left ear: Secondary | ICD-10-CM | POA: Diagnosis not present

## 2020-08-11 ENCOUNTER — Other Ambulatory Visit: Payer: Self-pay

## 2020-08-11 ENCOUNTER — Ambulatory Visit
Admission: RE | Admit: 2020-08-11 | Discharge: 2020-08-11 | Disposition: A | Discharge status: Home / Self Care | Payer: Medicare HMO | Source: Ambulatory Visit | Attending: Family Medicine | Admitting: Family Medicine

## 2020-08-11 DIAGNOSIS — Z1231 Encounter for screening mammogram for malignant neoplasm of breast: Secondary | ICD-10-CM | POA: Diagnosis not present

## 2020-08-26 DIAGNOSIS — M48062 Spinal stenosis, lumbar region with neurogenic claudication: Secondary | ICD-10-CM | POA: Diagnosis not present

## 2020-08-26 DIAGNOSIS — N184 Chronic kidney disease, stage 4 (severe): Secondary | ICD-10-CM | POA: Diagnosis not present

## 2020-08-26 DIAGNOSIS — I10 Essential (primary) hypertension: Secondary | ICD-10-CM | POA: Diagnosis not present

## 2020-08-26 DIAGNOSIS — E7800 Pure hypercholesterolemia, unspecified: Secondary | ICD-10-CM | POA: Diagnosis not present

## 2020-09-23 DIAGNOSIS — M109 Gout, unspecified: Secondary | ICD-10-CM | POA: Diagnosis not present

## 2020-09-23 DIAGNOSIS — N1832 Chronic kidney disease, stage 3b: Secondary | ICD-10-CM | POA: Diagnosis not present

## 2020-10-12 ENCOUNTER — Telehealth: Payer: Self-pay | Admitting: Physical Medicine and Rehabilitation

## 2020-10-12 NOTE — Telephone Encounter (Signed)
Patient is requesting another injection. Bilateral L3 TF 05/22/2019. Ok to repeat if helped, same problem/side, and no new injury?

## 2020-10-12 NOTE — Telephone Encounter (Signed)
Needs auth and scheduling for bilateral L3 TF.

## 2020-10-28 ENCOUNTER — Encounter: Payer: Self-pay | Admitting: Physical Medicine and Rehabilitation

## 2020-10-28 ENCOUNTER — Ambulatory Visit (INDEPENDENT_AMBULATORY_CARE_PROVIDER_SITE_OTHER): Payer: Medicare HMO | Admitting: Physical Medicine and Rehabilitation

## 2020-10-28 ENCOUNTER — Other Ambulatory Visit: Payer: Self-pay

## 2020-10-28 ENCOUNTER — Ambulatory Visit: Payer: Self-pay

## 2020-10-28 VITALS — BP 165/66 | HR 90

## 2020-10-28 DIAGNOSIS — M48062 Spinal stenosis, lumbar region with neurogenic claudication: Secondary | ICD-10-CM | POA: Diagnosis not present

## 2020-10-28 DIAGNOSIS — M961 Postlaminectomy syndrome, not elsewhere classified: Secondary | ICD-10-CM

## 2020-10-28 DIAGNOSIS — M5416 Radiculopathy, lumbar region: Secondary | ICD-10-CM

## 2020-10-28 MED ORDER — METHYLPREDNISOLONE ACETATE 80 MG/ML IJ SUSP
80.0000 mg | Freq: Once | INTRAMUSCULAR | Status: AC
  Administered 2020-10-28: 80 mg

## 2020-10-28 NOTE — Patient Instructions (Signed)

## 2020-10-28 NOTE — Progress Notes (Signed)
Pt state lower back pain that travels to her left hip. Pt state walking, standing and sitting makes the pain worse. Pt state she takes pain meds to help ease her pain. Pt hash hx of inj on 05/22/19 pt state it helped.  Numeric Pain Rating Scale and Functional Assessment Average Pain 4   In the last MONTH (on 0-10 scale) has pain interfered with the following?  1. General activity like being  able to carry out your everyday physical activities such as walking, climbing stairs, carrying groceries, or moving a chair?  Rating(10)   +Driver, -BT, -Dye Allergies.

## 2020-11-16 NOTE — Progress Notes (Signed)
Joanne Holmes - 75 y.o. female MRN [id number]  Date of birth: 10/16/1945  Office Visit Note: Visit Date: 10/28/2020 PCP: Caren Macadam, MD Referred by: Delilah Shan, MD  Subjective: Chief Complaint  Patient presents with   Lower Back - Pain   Left Hip - Pain   HPI:  Joanne Holmes is a 75 y.o. female who comes in today for planned repeat Bilateral L3-L4  Lumbar Transforaminal epidural steroid injection with fluoroscopic guidance.  The patient has failed conservative care including home exercise, medications, time and activity modification.  This injection will be diagnostic and hopefully therapeutic.  Please see requesting physician notes for further details and justification. Patient received more than 50% pain relief from prior injection.  She does report some recent deaths in the family Sharol Given COVID and she is under a lot of stress with that.  She reports not probably coming and when she could have Duda the pain but the injection did seem to help her.  Unfortunately she continues to use quite a bit of opioid medication through her primary care physician Dr. Caren Macadam.  I did review the controlled substance list and she is receiving 180 tablets of tramadol as well as 90 tablets of hydrocodone per month.  I did not really talk about this today since the injection in the past at least with me has helped.  In the past she is also seeing Dr. Alysia Penna but I do not see any follow-up visits for him from a pain management standpoint.  She might be a good candidate for spinal cord stimulator trial although we did not discuss that.  She reports no red flag symptoms of bowel or bladder changes or strength loss etc.  Referring: Dr. Jean Rosenthal   ROS Otherwise per HPI.  Assessment & Plan: Visit Diagnoses:    ICD-10-CM   1. Spinal stenosis of lumbar region with neurogenic claudication  M48.062 XR C-ARM NO REPORT    Epidural Steroid injection    methylPREDNISolone acetate  (DEPO-MEDROL) injection 80 mg    2. Lumbar radiculopathy  M54.16     3. Post laminectomy syndrome  M96.1       Plan: No additional findings.   Meds & Orders:  Meds ordered this encounter  Medications   methylPREDNISolone acetate (DEPO-MEDROL) injection 80 mg    Orders Placed This Encounter  Procedures   XR C-ARM NO REPORT   Epidural Steroid injection    Follow-up: Return if symptoms worsen or fail to improve.   Procedures: No procedures performed  Lumbosacral Transforaminal Epidural Steroid Injection - Sub-Pedicular Approach with Fluoroscopic Guidance  Patient: Joanne Holmes      Date of Birth: 11-Apr-1945 MRN: [id number] PCP: Caren Macadam, MD      Visit Date: 10/28/2020   Universal Protocol:    Date/Time: 10/28/2020  Consent Given By: the patient  Position: PRONE  Additional Comments: Vital signs were monitored before and after the procedure. Patient was prepped and draped in the usual sterile fashion. The correct patient, procedure, and site was verified.   Injection Procedure Details:   Procedure diagnoses: Spinal stenosis of lumbar region with neurogenic claudication [M48.062]    Meds Administered:  Meds ordered this encounter  Medications   methylPREDNISolone acetate (DEPO-MEDROL) injection 80 mg    Laterality: Bilateral  Location/Site:  L3-L4  Needle:5.0 in., 22 ga.  Short bevel or Quincke spinal needle  Needle Placement: Transforaminal  Findings:    -Comments: Excellent flow of contrast along the  nerve, nerve root and into the epidural space.  Procedure Details: After squaring off the end-plates to get a true AP view, the C-arm was positioned so that an oblique view of the foramen as noted above was visualized. The target area is just inferior to the "nose of the scotty dog" or sub pedicular. The soft tissues overlying this structure were infiltrated with 2-3 ml. of 1% Lidocaine without Epinephrine.  The spinal needle was inserted toward  the target using a "trajectory" view along the fluoroscope beam.  Under AP and lateral visualization, the needle was advanced so it did not puncture dura and was located close the 6 O'Clock position of the pedical in AP tracterory. Biplanar projections were used to confirm position. Aspiration was confirmed to be negative for CSF and/or blood. A 1-2 ml. volume of Isovue-250 was injected and flow of contrast was noted at each level. Radiographs were obtained for documentation purposes.   After attaining the desired flow of contrast documented above, a 0.5 to 1.0 ml test dose of 0.25% Marcaine was injected into each respective transforaminal space.  The patient was observed for 90 seconds post injection.  After no sensory deficits were reported, and normal lower extremity motor function was noted,   the above injectate was administered so that equal amounts of the injectate were placed at each foramen (level) into the transforaminal epidural space.   Additional Comments:  The patient tolerated the procedure well Dressing: 2 x 2 sterile gauze and Band-Aid    Post-procedure details: Patient was observed during the procedure. Post-procedure instructions were reviewed.  Patient left the clinic in stable condition.    Clinical History: Lumbar spine MRI dated 03/01/2016  Alignment: 4 mm anterolisthesis L3-4 with mild progression since 2013. 6.6 mm anterolisthesis L4-5 unchanged. Mild retrolisthesis L1-2.   Vertebrae:  Negative for fracture or mass.  Normal bone marrow   Conus medullaris: Extends to the L1-2 level and appears normal.   Paraspinal and other soft tissues: No retroperitoneal mass or adenopathy. Paraspinous muscles are symmetric with mild atrophy.   Disc levels:   L1-2: Mild retrolisthesis. Disc bulging and mild facet degeneration without significant stenosis.   L2-3:  Mild disc and facet degeneration.  Mild spinal stenosis.   L3-4: Severe spinal stenosis which has  progressed in the interval. There is anterior listhesis of L3 on L4. Diffuse disc bulging. Severe facet degeneration has progressed in the interval. There is marked subarticular stenosis bilaterally as well as moderate foraminal encroachment bilaterally.   L4-5: Severe spinal stenosis has progressed in the interval. Severe facet degeneration. Grade 1 anterior listhesis unchanged. Subarticular and foraminal encroachment bilaterally.   L5-S1: Severe disc degeneration with disc space narrowing and endplate spurring. Prior laminectomy on the left. Diffuse endplate scarring is present. There is soft tissue surrounding the left S1 nerve root which is unchanged most likely due to scar tissue. No recurrent disc protrusion. Mild foraminal narrowing bilaterally due to spurring.     Objective:  VS:  HT:    WT:   BMI:     BP:(!) 165/66  HR:90bpm  TEMP: ( )  RESP:  Physical Exam Vitals and nursing note reviewed.  Constitutional:      General: She is not in acute distress.    Appearance: Normal appearance. She is obese. She is not ill-appearing.  HENT:     Head: Normocephalic and atraumatic.     Right Ear: External ear normal.     Left Ear: External ear normal.  Eyes:  Extraocular Movements: Extraocular movements intact.  Cardiovascular:     Rate and Rhythm: Normal rate.     Pulses: Normal pulses.  Pulmonary:     Effort: Pulmonary effort is normal. No respiratory distress.  Abdominal:     General: There is no distension.     Palpations: Abdomen is soft.  Musculoskeletal:        General: Tenderness present.     Cervical back: Neck supple.     Right lower leg: No edema.     Left lower leg: No edema.     Comments: Patient has good distal strength with no pain over the greater trochanters.  No clonus or focal weakness.  Skin:    Findings: No erythema, lesion or rash.  Neurological:     General: No focal deficit present.     Mental Status: She is alert and oriented to person,  place, and time.     Sensory: No sensory deficit.     Motor: No weakness or abnormal muscle tone.     Coordination: Coordination normal.  Psychiatric:        Mood and Affect: Mood normal.        Behavior: Behavior normal.     Imaging: No results found.

## 2020-11-16 NOTE — Procedures (Signed)
Lumbosacral Transforaminal Epidural Steroid Injection - Sub-Pedicular Approach with Fluoroscopic Guidance  Patient: Joanne Holmes      Date of Birth: 01-16-1946 MRN: [id number] PCP: Caren Macadam, MD      Visit Date: 10/28/2020   Universal Protocol:    Date/Time: 10/28/2020  Consent Given By: the patient  Position: PRONE  Additional Comments: Vital signs were monitored before and after the procedure. Patient was prepped and draped in the usual sterile fashion. The correct patient, procedure, and site was verified.   Injection Procedure Details:   Procedure diagnoses: Spinal stenosis of lumbar region with neurogenic claudication [M48.062]    Meds Administered:  Meds ordered this encounter  Medications   methylPREDNISolone acetate (DEPO-MEDROL) injection 80 mg    Laterality: Bilateral  Location/Site:  L3-L4  Needle:5.0 in., 22 ga.  Short bevel or Quincke spinal needle  Needle Placement: Transforaminal  Findings:    -Comments: Excellent flow of contrast along the nerve, nerve root and into the epidural space.  Procedure Details: After squaring off the end-plates to get a true AP view, the C-arm was positioned so that an oblique view of the foramen as noted above was visualized. The target area is just inferior to the "nose of the scotty dog" or sub pedicular. The soft tissues overlying this structure were infiltrated with 2-3 ml. of 1% Lidocaine without Epinephrine.  The spinal needle was inserted toward the target using a "trajectory" view along the fluoroscope beam.  Under AP and lateral visualization, the needle was advanced so it did not puncture dura and was located close the 6 O'Clock position of the pedical in AP tracterory. Biplanar projections were used to confirm position. Aspiration was confirmed to be negative for CSF and/or blood. A 1-2 ml. volume of Isovue-250 was injected and flow of contrast was noted at each level. Radiographs were obtained for  documentation purposes.   After attaining the desired flow of contrast documented above, a 0.5 to 1.0 ml test dose of 0.25% Marcaine was injected into each respective transforaminal space.  The patient was observed for 90 seconds post injection.  After no sensory deficits were reported, and normal lower extremity motor function was noted,   the above injectate was administered so that equal amounts of the injectate were placed at each foramen (level) into the transforaminal epidural space.   Additional Comments:  The patient tolerated the procedure well Dressing: 2 x 2 sterile gauze and Band-Aid    Post-procedure details: Patient was observed during the procedure. Post-procedure instructions were reviewed.  Patient left the clinic in stable condition.

## 2020-11-30 DIAGNOSIS — J45909 Unspecified asthma, uncomplicated: Secondary | ICD-10-CM | POA: Diagnosis not present

## 2020-11-30 DIAGNOSIS — Z23 Encounter for immunization: Secondary | ICD-10-CM | POA: Diagnosis not present

## 2020-11-30 DIAGNOSIS — I10 Essential (primary) hypertension: Secondary | ICD-10-CM | POA: Diagnosis not present

## 2020-11-30 DIAGNOSIS — M48062 Spinal stenosis, lumbar region with neurogenic claudication: Secondary | ICD-10-CM | POA: Diagnosis not present

## 2020-11-30 DIAGNOSIS — J302 Other seasonal allergic rhinitis: Secondary | ICD-10-CM | POA: Diagnosis not present

## 2020-11-30 DIAGNOSIS — M109 Gout, unspecified: Secondary | ICD-10-CM | POA: Diagnosis not present

## 2020-11-30 DIAGNOSIS — N184 Chronic kidney disease, stage 4 (severe): Secondary | ICD-10-CM | POA: Diagnosis not present

## 2020-11-30 DIAGNOSIS — K219 Gastro-esophageal reflux disease without esophagitis: Secondary | ICD-10-CM | POA: Diagnosis not present

## 2020-12-29 DIAGNOSIS — Z01419 Encounter for gynecological examination (general) (routine) without abnormal findings: Secondary | ICD-10-CM | POA: Diagnosis not present

## 2021-01-11 DIAGNOSIS — N1832 Chronic kidney disease, stage 3b: Secondary | ICD-10-CM | POA: Diagnosis not present

## 2021-01-13 DIAGNOSIS — N1832 Chronic kidney disease, stage 3b: Secondary | ICD-10-CM | POA: Diagnosis not present

## 2021-01-13 DIAGNOSIS — N183 Chronic kidney disease, stage 3 unspecified: Secondary | ICD-10-CM | POA: Diagnosis not present

## 2021-01-13 DIAGNOSIS — I129 Hypertensive chronic kidney disease with stage 1 through stage 4 chronic kidney disease, or unspecified chronic kidney disease: Secondary | ICD-10-CM | POA: Diagnosis not present

## 2021-01-13 DIAGNOSIS — E669 Obesity, unspecified: Secondary | ICD-10-CM | POA: Diagnosis not present

## 2021-02-23 DIAGNOSIS — J45909 Unspecified asthma, uncomplicated: Secondary | ICD-10-CM | POA: Diagnosis not present

## 2021-02-23 DIAGNOSIS — I10 Essential (primary) hypertension: Secondary | ICD-10-CM | POA: Diagnosis not present

## 2021-02-23 DIAGNOSIS — E785 Hyperlipidemia, unspecified: Secondary | ICD-10-CM | POA: Diagnosis not present

## 2021-03-02 DIAGNOSIS — M48062 Spinal stenosis, lumbar region with neurogenic claudication: Secondary | ICD-10-CM | POA: Diagnosis not present

## 2021-03-02 DIAGNOSIS — I10 Essential (primary) hypertension: Secondary | ICD-10-CM | POA: Diagnosis not present

## 2021-03-02 DIAGNOSIS — E7800 Pure hypercholesterolemia, unspecified: Secondary | ICD-10-CM | POA: Diagnosis not present

## 2021-03-02 DIAGNOSIS — K219 Gastro-esophageal reflux disease without esophagitis: Secondary | ICD-10-CM | POA: Diagnosis not present

## 2021-03-02 DIAGNOSIS — N184 Chronic kidney disease, stage 4 (severe): Secondary | ICD-10-CM | POA: Diagnosis not present

## 2021-04-18 DIAGNOSIS — N1832 Chronic kidney disease, stage 3b: Secondary | ICD-10-CM | POA: Diagnosis not present

## 2021-04-20 DIAGNOSIS — E669 Obesity, unspecified: Secondary | ICD-10-CM | POA: Diagnosis not present

## 2021-04-20 DIAGNOSIS — N1832 Chronic kidney disease, stage 3b: Secondary | ICD-10-CM | POA: Diagnosis not present

## 2021-04-20 DIAGNOSIS — I129 Hypertensive chronic kidney disease with stage 1 through stage 4 chronic kidney disease, or unspecified chronic kidney disease: Secondary | ICD-10-CM | POA: Diagnosis not present

## 2021-04-20 DIAGNOSIS — Z87891 Personal history of nicotine dependence: Secondary | ICD-10-CM | POA: Diagnosis not present

## 2021-04-20 DIAGNOSIS — Z6835 Body mass index (BMI) 35.0-35.9, adult: Secondary | ICD-10-CM | POA: Diagnosis not present

## 2021-06-02 ENCOUNTER — Telehealth: Payer: Self-pay | Admitting: Physical Medicine and Rehabilitation

## 2021-06-02 NOTE — Telephone Encounter (Signed)
Pt called and would like to sch an appt with Dr.Newton. She would like to repeat bilateral L3 TF. Last injection was 10/28/2020.  ?Same issues  ? ? ?[id number] ?

## 2021-06-03 DIAGNOSIS — E538 Deficiency of other specified B group vitamins: Secondary | ICD-10-CM | POA: Diagnosis not present

## 2021-06-03 DIAGNOSIS — E785 Hyperlipidemia, unspecified: Secondary | ICD-10-CM | POA: Diagnosis not present

## 2021-06-03 DIAGNOSIS — J449 Chronic obstructive pulmonary disease, unspecified: Secondary | ICD-10-CM | POA: Diagnosis not present

## 2021-06-03 DIAGNOSIS — I10 Essential (primary) hypertension: Secondary | ICD-10-CM | POA: Diagnosis not present

## 2021-06-10 DIAGNOSIS — J449 Chronic obstructive pulmonary disease, unspecified: Secondary | ICD-10-CM | POA: Diagnosis not present

## 2021-06-10 DIAGNOSIS — J302 Other seasonal allergic rhinitis: Secondary | ICD-10-CM | POA: Diagnosis not present

## 2021-06-10 DIAGNOSIS — G894 Chronic pain syndrome: Secondary | ICD-10-CM | POA: Diagnosis not present

## 2021-06-10 DIAGNOSIS — N184 Chronic kidney disease, stage 4 (severe): Secondary | ICD-10-CM | POA: Diagnosis not present

## 2021-06-10 DIAGNOSIS — Z0000 Encounter for general adult medical examination without abnormal findings: Secondary | ICD-10-CM | POA: Diagnosis not present

## 2021-06-10 DIAGNOSIS — E7800 Pure hypercholesterolemia, unspecified: Secondary | ICD-10-CM | POA: Diagnosis not present

## 2021-06-10 DIAGNOSIS — K219 Gastro-esophageal reflux disease without esophagitis: Secondary | ICD-10-CM | POA: Diagnosis not present

## 2021-06-10 DIAGNOSIS — I10 Essential (primary) hypertension: Secondary | ICD-10-CM | POA: Diagnosis not present

## 2021-06-22 ENCOUNTER — Encounter: Payer: Self-pay | Admitting: Physical Medicine and Rehabilitation

## 2021-06-22 ENCOUNTER — Ambulatory Visit (INDEPENDENT_AMBULATORY_CARE_PROVIDER_SITE_OTHER): Payer: Medicare HMO | Admitting: Physical Medicine and Rehabilitation

## 2021-06-22 ENCOUNTER — Ambulatory Visit: Payer: Self-pay

## 2021-06-22 VITALS — BP 157/79 | HR 81

## 2021-06-22 DIAGNOSIS — M48062 Spinal stenosis, lumbar region with neurogenic claudication: Secondary | ICD-10-CM

## 2021-06-22 DIAGNOSIS — M5416 Radiculopathy, lumbar region: Secondary | ICD-10-CM | POA: Diagnosis not present

## 2021-06-22 MED ORDER — METHYLPREDNISOLONE ACETATE 80 MG/ML IJ SUSP
80.0000 mg | Freq: Once | INTRAMUSCULAR | Status: AC
  Administered 2021-06-22: 80 mg

## 2021-06-22 NOTE — Patient Instructions (Signed)

## 2021-06-22 NOTE — Progress Notes (Signed)
Pt state lower back pain that travels to her left hip. Pt state walking, standing and sitting makes the pain worse. Pt state she takes pain meds to help ease her pain. ? ?Numeric Pain Rating Scale and Functional Assessment ?Average Pain 7 ? ? ?In the last MONTH (on 0-10 scale) has pain interfered with the following? ? ?1. General activity like being  able to carry out your everyday physical activities such as walking, climbing stairs, carrying groceries, or moving a chair?  ?Rating(9) ? ? ?+Driver, -BT, -Dye Allergies. ? ?

## 2021-06-26 NOTE — Procedures (Signed)
Lumbosacral Transforaminal Epidural Steroid Injection - Sub-Pedicular Approach with Fluoroscopic Guidance ? ?Patient: Joanne Holmes      ?Date of Birth: 1945/11/26 ?MRN: [id number] ?PCP: Caren Macadam, MD      ?Visit Date: 06/22/2021 ?  ?Universal Protocol:    ?Date/Time: 06/22/2021 ? ?Consent Given By: the patient ? ?Position: PRONE ? ?Additional Comments: ?Vital signs were monitored before and after the procedure. ?Patient was prepped and draped in the usual sterile fashion. ?The correct patient, procedure, and site was verified. ? ? ?Injection Procedure Details:  ? ?Procedure diagnoses: Spinal stenosis of lumbar region with neurogenic claudication [M48.062]   ? ?Meds Administered:  ?Meds ordered this encounter  ?Medications  ? methylPREDNISolone acetate (DEPO-MEDROL) injection 80 mg  ? ? ?Laterality: Bilateral ? ?Location/Site: L3 ? ?Needle:5.0 in., 22 ga.  Short bevel or Quincke spinal needle ? ?Needle Placement: Transforaminal ? ?Findings: ?  ? -Comments: Excellent flow of contrast along the nerve, nerve root and into the epidural space. ? ?Procedure Details: ?After squaring off the end-plates to get a true AP view, the C-arm was positioned so that an oblique view of the foramen as noted above was visualized. The target area is just inferior to the "nose of the scotty dog" or sub pedicular. The soft tissues overlying this structure were infiltrated with 2-3 ml. of 1% Lidocaine without Epinephrine. ? ?The spinal needle was inserted toward the target using a "trajectory" view along the fluoroscope beam.  Under AP and lateral visualization, the needle was advanced so it did not puncture dura and was located close the 6 O'Clock position of the pedical in AP tracterory. Biplanar projections were used to confirm position. Aspiration was confirmed to be negative for CSF and/or blood. A 1-2 ml. volume of Isovue-250 was injected and flow of contrast was noted at each level. Radiographs were obtained for documentation  purposes.  ? ?After attaining the desired flow of contrast documented above, a 0.5 to 1.0 ml test dose of 0.25% Marcaine was injected into each respective transforaminal space.  The patient was observed for 90 seconds post injection.  After no sensory deficits were reported, and normal lower extremity motor function was noted,   the above injectate was administered so that equal amounts of the injectate were placed at each foramen (level) into the transforaminal epidural space. ? ? ?Additional Comments:  ?The patient tolerated the procedure well ?Dressing: 2 x 2 sterile gauze and Band-Aid ?  ? ?Post-procedure details: ?Patient was observed during the procedure. ?Post-procedure instructions were reviewed. ? ?Patient left the clinic in stable condition. ? ?

## 2021-06-26 NOTE — Progress Notes (Signed)
? ?[place] PA[facility] - 76 y.o. female MRN [id number]  Date of birth: June 12, 1945 ? ?Office Visit Note: ?Visit Date: 06/22/2021 ?PCP: Caren Macadam, MD ?Referred by: Caren Macadam, MD ? ?Subjective: ?Chief Complaint  ?Patient presents with  ? Lower Back - Pain  ? Left Hip - Pain  ? ?HPI:  Joanne Holmes is a 76 y.o. female who comes in today for planned repeat Bilateral L3-4  Lumbar Transforaminal epidural steroid injection with fluoroscopic guidance.  The patient has failed conservative care including home exercise, medications, time and activity modification.  This injection will be diagnostic and hopefully therapeutic.  Please see requesting physician notes for further details and justification. Patient received more than 50% pain relief from prior injection.  Multilevel stenosis status post lumbar laminectomy.  Consider repeat MRI and consider spinal cord stimulator trial. ? ? ? ?ROS Otherwise per HPI. ? ?Assessment & Plan: ?Visit Diagnoses:  ?  ICD-10-CM   ?1. Spinal stenosis of lumbar region with neurogenic claudication  M48.062 XR C-ARM NO REPORT  ?  Epidural Steroid injection  ?  methylPREDNISolone acetate (DEPO-MEDROL) injection 80 mg  ?  ?2. Lumbar radiculopathy  M54.16 XR C-ARM NO REPORT  ?  Epidural Steroid injection  ?  methylPREDNISolone acetate (DEPO-MEDROL) injection 80 mg  ?  ?  ?Plan: No additional findings.  ? ?Meds & Orders:  ?Meds ordered this encounter  ?Medications  ? methylPREDNISolone acetate (DEPO-MEDROL) injection 80 mg  ?  ?Orders Placed This Encounter  ?Procedures  ? XR C-ARM NO REPORT  ? Epidural Steroid injection  ?  ?Follow-up: Return if symptoms worsen or fail to improve.  ? ?Procedures: ?No procedures performed  ?Lumbosacral Transforaminal Epidural Steroid Injection - Sub-Pedicular Approach with Fluoroscopic Guidance ? ?Patient: Joanne Holmes      ?Date of Birth: 10-15-1945 ?MRN: [id number] ?PCP: Caren Macadam, MD      ?Visit Date: 06/22/2021 ?  ?Universal Protocol:     ?Date/Time: 06/22/2021 ? ?Consent Given By: the patient ? ?Position: PRONE ? ?Additional Comments: ?Vital signs were monitored before and after the procedure. ?Patient was prepped and draped in the usual sterile fashion. ?The correct patient, procedure, and site was verified. ? ? ?Injection Procedure Details:  ? ?Procedure diagnoses: Spinal stenosis of lumbar region with neurogenic claudication [M48.062]   ? ?Meds Administered:  ?Meds ordered this encounter  ?Medications  ? methylPREDNISolone acetate (DEPO-MEDROL) injection 80 mg  ? ? ?Laterality: Bilateral ? ?Location/Site: L3 ? ?Needle:5.0 in., 22 ga.  Short bevel or Quincke spinal needle ? ?Needle Placement: Transforaminal ? ?Findings: ?  ? -Comments: Excellent flow of contrast along the nerve, nerve root and into the epidural space. ? ?Procedure Details: ?After squaring off the end-plates to get a true AP view, the C-arm was positioned so that an oblique view of the foramen as noted above was visualized. The target area is just inferior to the "nose of the scotty dog" or sub pedicular. The soft tissues overlying this structure were infiltrated with 2-3 ml. of 1% Lidocaine without Epinephrine. ? ?The spinal needle was inserted toward the target using a "trajectory" view along the fluoroscope beam.  Under AP and lateral visualization, the needle was advanced so it did not puncture dura and was located close the 6 O'Clock position of the pedical in AP tracterory. Biplanar projections were used to confirm position. Aspiration was confirmed to be negative for CSF and/or blood. A 1-2 ml. volume of Isovue-250 was injected and flow of contrast was noted at each  level. Radiographs were obtained for documentation purposes.  ? ?After attaining the desired flow of contrast documented above, a 0.5 to 1.0 ml test dose of 0.25% Marcaine was injected into each respective transforaminal space.  The patient was observed for 90 seconds post injection.  After no sensory deficits  were reported, and normal lower extremity motor function was noted,   the above injectate was administered so that equal amounts of the injectate were placed at each foramen (level) into the transforaminal epidural space. ? ? ?Additional Comments:  ?The patient tolerated the procedure well ?Dressing: 2 x 2 sterile gauze and Band-Aid ?  ? ?Post-procedure details: ?Patient was observed during the procedure. ?Post-procedure instructions were reviewed. ? ?Patient left the clinic in stable condition. ?  ? ?Clinical History: ?Lumbar spine MRI dated 03/01/2016  ? ?Alignment: 4 mm anterolisthesis L3-4 with mild progression since  ?2013. 6.6 mm anterolisthesis L4-5 unchanged. Mild retrolisthesis  ?L1-2.  ?   ?Vertebrae:  Negative for fracture or mass.  Normal bone marrow  ?   ?Conus medullaris: Extends to the L1-2 level and appears normal.  ?   ?Paraspinal and other soft tissues: No retroperitoneal mass or  ?adenopathy. Paraspinous muscles are symmetric with mild atrophy.  ?   ?Disc levels:  ?   ?L1-2: Mild retrolisthesis. Disc bulging and mild facet degeneration  ?without significant stenosis.  ?   ?L2-3:  Mild disc and facet degeneration.  Mild spinal stenosis.  ?   ?L3-4: Severe spinal stenosis which has progressed in the interval.  ?There is anterior listhesis of L3 on L4. Diffuse disc bulging.  ?Severe facet degeneration has progressed in the interval. There is  ?marked subarticular stenosis bilaterally as well as moderate  ?foraminal encroachment bilaterally.  ?   ?L4-5: Severe spinal stenosis has progressed in the interval. Severe  ?facet degeneration. Grade 1 anterior listhesis unchanged.  ?Subarticular and foraminal encroachment bilaterally.  ?   ?L5-S1: Severe disc degeneration with disc space narrowing and  ?endplate spurring. Prior laminectomy on the left. Diffuse endplate  ?scarring is present. There is soft tissue surrounding the left S1  ?nerve root which is unchanged most likely due to scar tissue. No   ?recurrent disc protrusion. Mild foraminal narrowing bilaterally due  ?to spurring.  ? ? ? ?Objective:  VS:  HT:    WT:   BMI:     BP:(!) 157/79  HR:81bpm  TEMP: ( )  RESP:  ?Physical Exam ?Vitals and nursing note reviewed.  ?Constitutional:   ?   General: She is not in acute distress. ?   Appearance: Normal appearance. She is not ill-appearing.  ?HENT:  ?   Head: Normocephalic and atraumatic.  ?   Right Ear: External ear normal.  ?   Left Ear: External ear normal.  ?Eyes:  ?   Extraocular Movements: Extraocular movements intact.  ?Cardiovascular:  ?   Rate and Rhythm: Normal rate.  ?   Pulses: Normal pulses.  ?Pulmonary:  ?   Effort: Pulmonary effort is normal. No respiratory distress.  ?Abdominal:  ?   General: There is no distension.  ?   Palpations: Abdomen is soft.  ?Musculoskeletal:     ?   General: Tenderness present.  ?   Cervical back: Neck supple.  ?   Right lower leg: No edema.  ?   Left lower leg: No edema.  ?   Comments: Patient has good distal strength with no pain over the greater trochanters.  No clonus or focal  weakness.  ?Skin: ?   Findings: No erythema, lesion or rash.  ?Neurological:  ?   General: No focal deficit present.  ?   Mental Status: She is alert and oriented to person, place, and time.  ?   Sensory: No sensory deficit.  ?   Motor: No weakness or abnormal muscle tone.  ?   Coordination: Coordination normal.  ?Psychiatric:     ?   Mood and Affect: Mood normal.     ?   Behavior: Behavior normal.  ?  ? ?Imaging: ?No results found. ?

## 2021-07-19 DIAGNOSIS — N1832 Chronic kidney disease, stage 3b: Secondary | ICD-10-CM | POA: Diagnosis not present

## 2021-07-20 DIAGNOSIS — I129 Hypertensive chronic kidney disease with stage 1 through stage 4 chronic kidney disease, or unspecified chronic kidney disease: Secondary | ICD-10-CM | POA: Diagnosis not present

## 2021-07-20 DIAGNOSIS — E669 Obesity, unspecified: Secondary | ICD-10-CM | POA: Diagnosis not present

## 2021-07-20 DIAGNOSIS — N1832 Chronic kidney disease, stage 3b: Secondary | ICD-10-CM | POA: Diagnosis not present

## 2021-07-20 DIAGNOSIS — N183 Chronic kidney disease, stage 3 unspecified: Secondary | ICD-10-CM | POA: Diagnosis not present

## 2021-08-18 ENCOUNTER — Telehealth: Payer: Self-pay | Admitting: Physical Medicine and Rehabilitation

## 2021-08-18 NOTE — Telephone Encounter (Signed)
Patient called advised the injection has worn off . Patient said the injection started wearing off last week. Patient said Dr. Ernestina Patches wanted to know when the injection started wearing off. The number to contact patient is [phone]

## 2021-08-19 ENCOUNTER — Other Ambulatory Visit: Payer: Self-pay | Admitting: Physical Medicine and Rehabilitation

## 2021-08-19 DIAGNOSIS — M5416 Radiculopathy, lumbar region: Secondary | ICD-10-CM

## 2021-08-19 DIAGNOSIS — M48062 Spinal stenosis, lumbar region with neurogenic claudication: Secondary | ICD-10-CM

## 2021-08-23 ENCOUNTER — Encounter: Payer: Self-pay | Admitting: Physical Medicine and Rehabilitation

## 2021-08-23 ENCOUNTER — Ambulatory Visit (INDEPENDENT_AMBULATORY_CARE_PROVIDER_SITE_OTHER): Payer: Medicare HMO | Admitting: Physical Medicine and Rehabilitation

## 2021-08-23 DIAGNOSIS — M961 Postlaminectomy syndrome, not elsewhere classified: Secondary | ICD-10-CM

## 2021-08-23 DIAGNOSIS — M5416 Radiculopathy, lumbar region: Secondary | ICD-10-CM

## 2021-08-23 DIAGNOSIS — M48062 Spinal stenosis, lumbar region with neurogenic claudication: Secondary | ICD-10-CM | POA: Diagnosis not present

## 2021-08-23 DIAGNOSIS — G894 Chronic pain syndrome: Secondary | ICD-10-CM | POA: Diagnosis not present

## 2021-08-23 NOTE — Progress Notes (Unsigned)
Joanne Holmes - 76 y.o. female MRN [id number]  Date of birth: 31-Dec-1945  Office Visit Note: Visit Date: 08/23/2021 PCP: Caren Macadam, MD Referred by: Caren Macadam, MD  Subjective: Chief Complaint  Patient presents with   Lower Back - Pain   HPI: Joanne Holmes is a 76 y.o. female who comes in today for evaluation of chronic, worsening and severe bilateral lower back pain radiating to hips. Patient reports pain has been ongoing for several years and is exacerbated by movement, activity and sitting. She describes her pain as sore and aching sensation, currently rates as 8 out of 10. Patient reports some relief of pain with home exercise regimen, rest, hot showers and use of medications. Patient currently taking Tramadol and Norco for moderate/severe pain that is prescribed by her primary care provider Dr. Caren Macadam. Patients lumbar MRI from 2017 exhibits severe spinal canal stenosis and 4 mm listhesis at L3-L4. There is also severe spinal stenosis and 6.6 mm listhesis noted at L4-L5. Patient was previously treated by Dr. Jovita Gamma whom also performed multiple lumbar epidural steroid injections with some relief of pain. Patient does have history of prior lumbar laminectomy on the left at L5-S1 performed by Dr. Regino Schultze. Patient has had multiple lumbar epidural steroid injections performed in our office over the years, most recent was bilateral L3 transforaminal epidural steroid injection on 06/22/2021, patient reports significant pain relief with this injection, however pain relief was very short lived. Patient did bring in TENS unit [place] and would like to know if this is the correct product. Patient denies focal weakness, numbness and tingling. Patient denies recent trauma or falls.   Review of Systems  Musculoskeletal:  Positive for back pain.  Neurological:  Negative for tingling, sensory change, focal weakness and weakness.  All other systems  reviewed and are negative. Otherwise per HPI.  Assessment & Plan: Visit Diagnoses:    ICD-10-CM   1. Lumbar radiculopathy  M54.16     2. Spinal stenosis of lumbar region with neurogenic claudication  M48.062     3. Post laminectomy syndrome  M96.1     4. Chronic pain syndrome  G89.4        Plan: Findings:  Chronic, worsening and severe bilateral lower back pain radiating to hips. Patient continues to have severe pain despite good conservative therapies such as home exercise regimen, rest and use of medications. Patients clinical presentation and exam are consistent with neurogenic claudication as a result of spinal canal stenosis as well as facet mediated pain. We do not feel repeating epidural steroid injection at this time would be beneficial. I did discuss use of TENS unit with her in detail, she should now be able to operate device at home. I also discussed spinal cord stimulator trial with patient in detail using educational material and spine model. I do think patient would be ideal candidate for procedure as she has chronic pain, undergone prior laminectomy, she has also failed lumbar epidural steroid injections and other conservative therapies. Patient would like time to think about spinal cord stimulator trial, I did inform her that we are happy to arrange for Tillie Rung with [facility] to call her with more information when she is ready. Patient states she will let us know her final decision soon. No red flag symptoms noted upon exam today.    Meds & Orders: No orders of the defined types were placed in this encounter.  No orders of the defined  types were placed in this encounter.   Follow-up: Return if symptoms worsen or fail to improve.   Procedures: No procedures performed      Clinical History: Lumbar spine MRI dated 03/01/2016   Alignment: 4 mm anterolisthesis L3-4 with mild progression since  2013. 6.6 mm anterolisthesis L4-5 unchanged. Mild retrolisthesis  L1-2.      Vertebrae:  Negative for fracture or mass.  Normal bone marrow     Conus medullaris: Extends to the L1-2 level and appears normal.     Paraspinal and other soft tissues: No retroperitoneal mass or  adenopathy. Paraspinous muscles are symmetric with mild atrophy.     Disc levels:     L1-2: Mild retrolisthesis. Disc bulging and mild facet degeneration  without significant stenosis.     L2-3:  Mild disc and facet degeneration.  Mild spinal stenosis.     L3-4: Severe spinal stenosis which has progressed in the interval.  There is anterior listhesis of L3 on L4. Diffuse disc bulging.  Severe facet degeneration has progressed in the interval. There is  marked subarticular stenosis bilaterally as well as moderate  foraminal encroachment bilaterally.     L4-5: Severe spinal stenosis has progressed in the interval. Severe  facet degeneration. Grade 1 anterior listhesis unchanged.  Subarticular and foraminal encroachment bilaterally.     L5-S1: Severe disc degeneration with disc space narrowing and  endplate spurring. Prior laminectomy on the left. Diffuse endplate  scarring is present. There is soft tissue surrounding the left S1  nerve root which is unchanged most likely due to scar tissue. No  recurrent disc protrusion. Mild foraminal narrowing bilaterally due  to spurring.   She reports that she quit smoking about 40 years ago. Her smoking use included cigarettes. She has a 40.00 pack-year smoking history. She has never used smokeless tobacco. No results for input(s): HGBA1C, LABURIC in the last 8760 hours.  Objective:  VS:  HT:    WT:   BMI:     BP:   HR: bpm  TEMP: ( )  RESP:  Physical Exam Vitals and nursing note reviewed.  HENT:     Head: Normocephalic and atraumatic.     Right Ear: External ear normal.     Left Ear: External ear normal.     Nose: Nose normal.     Mouth/Throat:     Mouth: Mucous membranes are moist.  Eyes:     Extraocular Movements: Extraocular  movements intact.  Cardiovascular:     Rate and Rhythm: Normal rate.     Pulses: Normal pulses.  Pulmonary:     Effort: Pulmonary effort is normal.  Abdominal:     General: Abdomen is flat. There is no distension.  Musculoskeletal:        General: Tenderness present.     Cervical back: Normal range of motion.     Comments: Pt rises from seated position to standing without difficulty. Good lumbar range of motion. Strong distal strength without clonus, no pain upon palpation of greater trochanters. Sensation intact bilaterally. Walks independently, gait steady.   Skin:    General: Skin is warm and dry.     Capillary Refill: Capillary refill takes less than 2 seconds.  Neurological:     General: No focal deficit present.     Mental Status: She is alert and oriented to person, place, and time.  Psychiatric:        Mood and Affect: Mood normal.        Behavior:  Behavior normal.    Ortho Exam  Imaging: No results found.  Past Medical/Family/Surgical/Social History: Medications & Allergies reviewed per EMR, new medications updated. Patient Active Problem List   Diagnosis Date Noted   Hypoxia 12/22/2016   DOE (dyspnea on exertion) 12/22/2016   Spinal stenosis of lumbar region with neurogenic claudication 04/27/2016   Post laminectomy syndrome 04/27/2016   Essential hypertension 05/11/2014   Persistent vomiting 03/18/2012   Hypertensive urgency 03/18/2012   COPD (chronic obstructive pulmonary disease) ([facility]) 03/18/2012   Past Medical History:  Diagnosis Date   Allergy    Asthma    COPD (chronic obstructive pulmonary disease) ([facility])    Hypertension    Kidney stone    Neuromuscular disorder ([facility])    carpal tunnel- left   Pneumonia    Spinal stenosis    Family History  Problem Relation Age of Onset   CAD Mother    Cancer Father        lymphosarcoma   Breast cancer Neg Hx    Past Surgical History:  Procedure Laterality Date   ABDOMINAL HYSTERECTOMY     BACK SURGERY  25+  yrs ago   lumbar laminectomy L5-S1   BLADDER SUSPENSION  ~10 yrs ago   CARPAL TUNNEL RELEASE  03/08/2012   Procedure: CARPAL TUNNEL RELEASE;  Surgeon: Cammie Sickle., MD;  Location: [facility];  Service: Orthopedics;  Laterality: Left;   SALPINGOOPHORECTOMY  ~8 yrs ago   TONSILLECTOMY     Social History   Occupational History   Not on file  Tobacco Use   Smoking status: Former    Packs/day: 2.00    Years: 20.00    Pack years: 40.00    Types: Cigarettes    Quit date: 03/06/1981    Years since quitting: 40.4   Smokeless tobacco: Never  Vaping Use   Vaping Use: Never used  Substance and Sexual Activity   Alcohol use: No   Drug use: No   Sexual activity: Not Currently

## 2021-08-23 NOTE — Progress Notes (Unsigned)
Pt state lower back pain. Pt state waking, standing and laying down makes the pain worse. Pt state she takes pain meds and uses heat to help ease her pain.   Numeric Pain Rating Scale and Functional Assessment Average Pain 9 Pain Right Now 4 My pain is constant, sharp, dull, stabbing, and aching Pain is worse with: walking, bending, sitting, standing, some activites, and laying down Pain improves with: heat/ice, medication, and TENS   In the last MONTH (on 0-10 scale) has pain interfered with the following?  1. General activity like being  able to carry out your everyday physical activities such as walking, climbing stairs, carrying groceries, or moving a chair?  Rating(5)  2. Relation with others like being able to carry out your usual social activities and roles such as  activities at home, at work and in your community. Rating(6)  3. Enjoyment of life such that you have  been bothered by emotional problems such as feeling anxious, depressed or irritable?  Rating(7)

## 2021-09-01 ENCOUNTER — Other Ambulatory Visit: Payer: Self-pay | Admitting: Family Medicine

## 2021-09-01 DIAGNOSIS — Z1231 Encounter for screening mammogram for malignant neoplasm of breast: Secondary | ICD-10-CM

## 2021-09-02 DIAGNOSIS — I10 Essential (primary) hypertension: Secondary | ICD-10-CM | POA: Diagnosis not present

## 2021-09-02 DIAGNOSIS — E538 Deficiency of other specified B group vitamins: Secondary | ICD-10-CM | POA: Diagnosis not present

## 2021-09-02 DIAGNOSIS — E785 Hyperlipidemia, unspecified: Secondary | ICD-10-CM | POA: Diagnosis not present

## 2021-09-02 DIAGNOSIS — J449 Chronic obstructive pulmonary disease, unspecified: Secondary | ICD-10-CM | POA: Diagnosis not present

## 2021-09-07 ENCOUNTER — Ambulatory Visit
Admission: RE | Admit: 2021-09-07 | Discharge: 2021-09-07 | Disposition: A | Discharge status: Home / Self Care | Payer: Medicare HMO | Source: Ambulatory Visit | Attending: Family Medicine | Admitting: Family Medicine

## 2021-09-07 DIAGNOSIS — Z1231 Encounter for screening mammogram for malignant neoplasm of breast: Secondary | ICD-10-CM

## 2021-09-22 ENCOUNTER — Encounter: Payer: Self-pay | Admitting: Physical Medicine and Rehabilitation

## 2021-09-22 ENCOUNTER — Ambulatory Visit: Payer: Self-pay

## 2021-09-22 ENCOUNTER — Ambulatory Visit (INDEPENDENT_AMBULATORY_CARE_PROVIDER_SITE_OTHER): Payer: Medicare HMO | Admitting: Physical Medicine and Rehabilitation

## 2021-09-22 VITALS — BP 162/69 | HR 71

## 2021-09-22 DIAGNOSIS — M48062 Spinal stenosis, lumbar region with neurogenic claudication: Secondary | ICD-10-CM

## 2021-09-22 DIAGNOSIS — M5416 Radiculopathy, lumbar region: Secondary | ICD-10-CM | POA: Diagnosis not present

## 2021-09-22 DIAGNOSIS — M961 Postlaminectomy syndrome, not elsewhere classified: Secondary | ICD-10-CM

## 2021-09-22 MED ORDER — METHYLPREDNISOLONE ACETATE 80 MG/ML IJ SUSP: 80.0000 mg | Freq: Once | INTRAMUSCULAR | Status: DC

## 2021-09-22 NOTE — Progress Notes (Signed)
Pt state lower back pain. Pt state waking, standing and laying down makes the pain worse. Pt state she takes pain meds and uses heat to help ease her pain.   Numeric Pain Rating Scale and Functional Assessment Average Pain 3   In the last MONTH (on 0-10 scale) has pain interfered with the following?  1. General activity like being  able to carry out your everyday physical activities such as walking, climbing stairs, carrying groceries, or moving a chair?  Rating(9)   +Driver, -BT, -Dye Allergies.

## 2021-09-22 NOTE — Patient Instructions (Signed)

## 2021-10-10 ENCOUNTER — Inpatient Hospital Stay (HOSPITAL_COMMUNITY)
Admission: EM | Admit: 2021-10-10 | Discharge: 2021-10-15 | DRG: 394 | Disposition: A | Discharge status: Home / Self Care | Payer: Medicare HMO | Attending: Internal Medicine | Admitting: Internal Medicine

## 2021-10-10 ENCOUNTER — Emergency Department (HOSPITAL_COMMUNITY): Payer: Medicare HMO

## 2021-10-10 ENCOUNTER — Other Ambulatory Visit: Payer: Self-pay

## 2021-10-10 ENCOUNTER — Encounter (HOSPITAL_COMMUNITY): Payer: Self-pay

## 2021-10-10 DIAGNOSIS — M48061 Spinal stenosis, lumbar region without neurogenic claudication: Secondary | ICD-10-CM | POA: Diagnosis present

## 2021-10-10 DIAGNOSIS — I160 Hypertensive urgency: Secondary | ICD-10-CM | POA: Diagnosis present

## 2021-10-10 DIAGNOSIS — Z6834 Body mass index (BMI) 34.0-34.9, adult: Secondary | ICD-10-CM

## 2021-10-10 DIAGNOSIS — Z7951 Long term (current) use of inhaled steroids: Secondary | ICD-10-CM

## 2021-10-10 DIAGNOSIS — R58 Hemorrhage, not elsewhere classified: Secondary | ICD-10-CM | POA: Diagnosis not present

## 2021-10-10 DIAGNOSIS — R739 Hyperglycemia, unspecified: Secondary | ICD-10-CM

## 2021-10-10 DIAGNOSIS — N1831 Chronic kidney disease, stage 3a: Secondary | ICD-10-CM | POA: Diagnosis present

## 2021-10-10 DIAGNOSIS — Z743 Need for continuous supervision: Secondary | ICD-10-CM | POA: Diagnosis not present

## 2021-10-10 DIAGNOSIS — J45909 Unspecified asthma, uncomplicated: Secondary | ICD-10-CM | POA: Diagnosis present

## 2021-10-10 DIAGNOSIS — K625 Hemorrhage of anus and rectum: Secondary | ICD-10-CM | POA: Diagnosis not present

## 2021-10-10 DIAGNOSIS — Z8249 Family history of ischemic heart disease and other diseases of the circulatory system: Secondary | ICD-10-CM

## 2021-10-10 DIAGNOSIS — I129 Hypertensive chronic kidney disease with stage 1 through stage 4 chronic kidney disease, or unspecified chronic kidney disease: Secondary | ICD-10-CM | POA: Diagnosis present

## 2021-10-10 DIAGNOSIS — R112 Nausea with vomiting, unspecified: Secondary | ICD-10-CM | POA: Diagnosis not present

## 2021-10-10 DIAGNOSIS — K529 Noninfective gastroenteritis and colitis, unspecified: Principal | ICD-10-CM | POA: Diagnosis present

## 2021-10-10 DIAGNOSIS — E66811 Obesity, class 1: Secondary | ICD-10-CM | POA: Diagnosis present

## 2021-10-10 DIAGNOSIS — M4316 Spondylolisthesis, lumbar region: Secondary | ICD-10-CM | POA: Diagnosis present

## 2021-10-10 DIAGNOSIS — K559 Vascular disorder of intestine, unspecified: Secondary | ICD-10-CM | POA: Diagnosis not present

## 2021-10-10 DIAGNOSIS — Z90710 Acquired absence of both cervix and uterus: Secondary | ICD-10-CM

## 2021-10-10 DIAGNOSIS — K5289 Other specified noninfective gastroenteritis and colitis: Secondary | ICD-10-CM | POA: Diagnosis not present

## 2021-10-10 DIAGNOSIS — Z87891 Personal history of nicotine dependence: Secondary | ICD-10-CM

## 2021-10-10 DIAGNOSIS — J449 Chronic obstructive pulmonary disease, unspecified: Secondary | ICD-10-CM | POA: Diagnosis present

## 2021-10-10 DIAGNOSIS — E669 Obesity, unspecified: Secondary | ICD-10-CM | POA: Diagnosis present

## 2021-10-10 DIAGNOSIS — R1084 Generalized abdominal pain: Secondary | ICD-10-CM | POA: Diagnosis not present

## 2021-10-10 DIAGNOSIS — K6389 Other specified diseases of intestine: Secondary | ICD-10-CM | POA: Diagnosis not present

## 2021-10-10 DIAGNOSIS — Z807 Family history of other malignant neoplasms of lymphoid, hematopoietic and related tissues: Secondary | ICD-10-CM

## 2021-10-10 DIAGNOSIS — M109 Gout, unspecified: Secondary | ICD-10-CM | POA: Insufficient documentation

## 2021-10-10 DIAGNOSIS — I10 Essential (primary) hypertension: Secondary | ICD-10-CM | POA: Diagnosis not present

## 2021-10-10 DIAGNOSIS — Z79899 Other long term (current) drug therapy: Secondary | ICD-10-CM

## 2021-10-10 DIAGNOSIS — N179 Acute kidney failure, unspecified: Secondary | ICD-10-CM | POA: Diagnosis present

## 2021-10-10 DIAGNOSIS — A09 Infectious gastroenteritis and colitis, unspecified: Secondary | ICD-10-CM | POA: Diagnosis present

## 2021-10-10 DIAGNOSIS — E876 Hypokalemia: Secondary | ICD-10-CM | POA: Diagnosis present

## 2021-10-10 DIAGNOSIS — K219 Gastro-esophageal reflux disease without esophagitis: Secondary | ICD-10-CM | POA: Insufficient documentation

## 2021-10-10 LAB — COMPREHENSIVE METABOLIC PANEL
ALT: 23 U/L (ref 0–44)
AST: 27 U/L (ref 15–41)
Albumin: 4.3 g/dL (ref 3.5–5.0)
Alkaline Phosphatase: 106 U/L (ref 38–126)
Anion gap: 13 (ref 5–15)
BUN: 47 mg/dL — ABNORMAL HIGH (ref 8–23)
CO2: 19 mmol/L — ABNORMAL LOW (ref 22–32)
Calcium: 10 mg/dL (ref 8.9–10.3)
Chloride: 109 mmol/L (ref 98–111)
Creatinine, Ser: 2.26 mg/dL — ABNORMAL HIGH (ref 0.44–1.00)
GFR, Estimated: 22 mL/min — ABNORMAL LOW (ref >60)
Glucose, Bld: 212 mg/dL — ABNORMAL HIGH (ref 70–99)
Potassium: 4 mmol/L (ref 3.5–5.1)
Sodium: 141 mmol/L (ref 135–145)
Total Bilirubin: 0.3 mg/dL (ref 0.3–1.2)
Total Protein: 7.6 g/dL (ref 6.5–8.1)

## 2021-10-10 LAB — LACTIC ACID, PLASMA
Lactic Acid, Venous: 2.9 mmol/L (ref 0.5–1.9)
Lactic Acid, Venous: 2.6 mmol/L (ref 0.5–1.9)
Lactic Acid, Venous: 2.6 mmol/L (ref 0.5–1.9)

## 2021-10-10 LAB — CBC WITH DIFFERENTIAL/PLATELET
Abs Immature Granulocytes: 0.11 10*3/uL — ABNORMAL HIGH (ref 0.00–0.07)
Basophils Absolute: 0.1 10*3/uL (ref 0.0–0.1)
Basophils Relative: 0 %
Eosinophils Absolute: 0 10*3/uL (ref 0.0–0.5)
Eosinophils Relative: 0 %
HCT: 43.2 % (ref 36.0–46.0)
Hemoglobin: 14.5 g/dL (ref 12.0–15.0)
Immature Granulocytes: 1 %
Lymphocytes Relative: 4 %
Lymphs Abs: 0.9 10*3/uL (ref 0.7–4.0)
MCH: 28.9 pg (ref 26.0–34.0)
MCHC: 33.6 g/dL (ref 30.0–36.0)
MCV: 86.2 fL (ref 80.0–100.0)
Monocytes Absolute: 1.5 10*3/uL — ABNORMAL HIGH (ref 0.1–1.0)
Monocytes Relative: 7 %
Neutro Abs: 19.3 10*3/uL — ABNORMAL HIGH (ref 1.7–7.7)
Neutrophils Relative %: 88 %
Platelets: 257 10*3/uL (ref 150–400)
RBC: 5.01 MIL/uL (ref 3.87–5.11)
RDW: 14.9 % (ref 11.5–15.5)
WBC: 21.9 10*3/uL — ABNORMAL HIGH (ref 4.0–10.5)
nRBC: 0 % (ref 0.0–0.2)

## 2021-10-10 LAB — SAMPLE TO BLOOD BANK

## 2021-10-10 LAB — LIPASE, BLOOD: Lipase: 31 U/L (ref 11–51)

## 2021-10-10 MED ORDER — PANTOPRAZOLE SODIUM 40 MG IV SOLR
40.0000 mg | INTRAVENOUS | Status: DC
  Administered 2021-10-10 – 2021-10-13 (×4): 40 mg via INTRAVENOUS
  Filled 2021-10-10 (×4): qty 10

## 2021-10-10 MED ORDER — FLUTICASONE FUROATE-VILANTEROL 200-25 MCG/ACT IN AEPB
1.0000 | INHALATION_SPRAY | Freq: Every day | RESPIRATORY_TRACT | Status: DC
  Filled 2021-10-10: qty 28

## 2021-10-10 MED ORDER — FLUTICASONE FUROATE-VILANTEROL 200-25 MCG/ACT IN AEPB
1.0000 | INHALATION_SPRAY | Freq: Every day | RESPIRATORY_TRACT | Status: DC
  Administered 2021-10-11 – 2021-10-15 (×5): 1 via RESPIRATORY_TRACT
  Filled 2021-10-10: qty 28

## 2021-10-10 MED ORDER — LACTATED RINGERS IV BOLUS
1000.0000 mL | Freq: Once | INTRAVENOUS | Status: AC
  Administered 2021-10-10: 1000 mL via INTRAVENOUS

## 2021-10-10 MED ORDER — METOPROLOL TARTRATE 5 MG/5ML IV SOLN
5.0000 mg | Freq: Two times a day (BID) | INTRAVENOUS | Status: DC
  Administered 2021-10-10 – 2021-10-11 (×2): 5 mg via INTRAVENOUS
  Filled 2021-10-10 (×2): qty 5

## 2021-10-10 MED ORDER — UMECLIDINIUM BROMIDE 62.5 MCG/ACT IN AEPB
1.0000 | INHALATION_SPRAY | Freq: Every day | RESPIRATORY_TRACT | Status: DC
  Administered 2021-10-11 – 2021-10-15 (×5): 1 via RESPIRATORY_TRACT
  Filled 2021-10-10: qty 7

## 2021-10-10 MED ORDER — ONDANSETRON HCL 4 MG PO TABS: 4.0000 mg | ORAL_TABLET | Freq: Four times a day (QID) | ORAL | Status: DC | PRN

## 2021-10-10 MED ORDER — ACETAMINOPHEN 325 MG PO TABS
650.0000 mg | ORAL_TABLET | Freq: Four times a day (QID) | ORAL | Status: DC | PRN
  Administered 2021-10-10 – 2021-10-14 (×2): 650 mg via ORAL
  Filled 2021-10-10 (×2): qty 2

## 2021-10-10 MED ORDER — HYDROMORPHONE HCL 1 MG/ML IJ SOLN
0.7500 mg | INTRAMUSCULAR | Status: DC | PRN
  Administered 2021-10-10 – 2021-10-15 (×10): 0.75 mg via INTRAVENOUS
  Filled 2021-10-10 (×10): qty 1

## 2021-10-10 MED ORDER — FENTANYL CITRATE PF 50 MCG/ML IJ SOSY
50.0000 ug | PREFILLED_SYRINGE | Freq: Once | INTRAMUSCULAR | Status: AC
  Administered 2021-10-10: 50 ug via INTRAVENOUS
  Filled 2021-10-10: qty 1

## 2021-10-10 MED ORDER — LEVOFLOXACIN IN D5W 500 MG/100ML IV SOLN
500.0000 mg | INTRAVENOUS | Status: DC
  Filled 2021-10-10: qty 100

## 2021-10-10 MED ORDER — ACETAMINOPHEN 325 MG PO TABS
650.0000 mg | ORAL_TABLET | Freq: Once | ORAL | Status: AC
  Administered 2021-10-10: 650 mg via ORAL
  Filled 2021-10-10: qty 2

## 2021-10-10 MED ORDER — METRONIDAZOLE 500 MG/100ML IV SOLN
500.0000 mg | Freq: Two times a day (BID) | INTRAVENOUS | Status: DC
  Administered 2021-10-10 – 2021-10-13 (×6): 500 mg via INTRAVENOUS
  Filled 2021-10-10 (×6): qty 100

## 2021-10-10 MED ORDER — METOPROLOL TARTRATE 5 MG/5ML IV SOLN
5.0000 mg | Freq: Once | INTRAVENOUS | Status: AC
  Administered 2021-10-10: 5 mg via INTRAVENOUS
  Filled 2021-10-10: qty 5

## 2021-10-10 MED ORDER — ONDANSETRON HCL 4 MG/2ML IJ SOLN: 4.0000 mg | Freq: Four times a day (QID) | INTRAMUSCULAR | Status: DC | PRN

## 2021-10-10 MED ORDER — ACETAMINOPHEN 650 MG RE SUPP: 650.0000 mg | Freq: Four times a day (QID) | RECTAL | Status: DC | PRN

## 2021-10-10 MED ORDER — LEVOFLOXACIN IN D5W 750 MG/150ML IV SOLN
750.0000 mg | Freq: Once | INTRAVENOUS | Status: AC
  Administered 2021-10-10: 750 mg via INTRAVENOUS
  Filled 2021-10-10: qty 150

## 2021-10-10 MED ORDER — SODIUM CHLORIDE (PF) 0.9 % IJ SOLN
INTRAMUSCULAR | Status: AC
  Filled 2021-10-10: qty 50

## 2021-10-10 MED ORDER — UMECLIDINIUM BROMIDE 62.5 MCG/ACT IN AEPB
1.0000 | INHALATION_SPRAY | Freq: Every day | RESPIRATORY_TRACT | Status: DC
  Filled 2021-10-10: qty 7

## 2021-10-10 MED ORDER — ONDANSETRON HCL 4 MG/2ML IJ SOLN
4.0000 mg | Freq: Once | INTRAMUSCULAR | Status: AC
  Administered 2021-10-10: 4 mg via INTRAVENOUS
  Filled 2021-10-10: qty 2

## 2021-10-10 MED ORDER — LEVOFLOXACIN IN D5W 500 MG/100ML IV SOLN: 500.0000 mg | INTRAVENOUS | Status: DC

## 2021-10-10 MED ORDER — METRONIDAZOLE 500 MG/100ML IV SOLN
500.0000 mg | Freq: Once | INTRAVENOUS | Status: AC
  Administered 2021-10-10: 500 mg via INTRAVENOUS
  Filled 2021-10-10: qty 100

## 2021-10-10 MED ORDER — IOHEXOL 9 MG/ML PO SOLN
ORAL | Status: AC
  Filled 2021-10-10: qty 1000

## 2021-10-10 MED ORDER — HYDRALAZINE HCL 20 MG/ML IJ SOLN
10.0000 mg | INTRAMUSCULAR | Status: DC | PRN
  Administered 2021-10-12 – 2021-10-15 (×2): 10 mg via INTRAVENOUS
  Filled 2021-10-10 (×2): qty 1

## 2021-10-10 MED ORDER — HYDRALAZINE HCL 20 MG/ML IJ SOLN
15.0000 mg | Freq: Once | INTRAMUSCULAR | Status: AC
  Administered 2021-10-10: 15 mg via INTRAVENOUS
  Filled 2021-10-10: qty 1

## 2021-10-10 MED ORDER — IOHEXOL 9 MG/ML PO SOLN
500.0000 mL | ORAL | Status: AC
  Administered 2021-10-10 (×2): 500 mL via ORAL

## 2021-10-10 MED ORDER — LACTATED RINGERS IV SOLN: INTRAVENOUS | Status: AC

## 2021-10-10 NOTE — ED Notes (Signed)
Rn at bedside collecting labs aware of PT BP

## 2021-10-10 NOTE — Consult Note (Signed)
[facility] Surgery Consult/Admission Note  Joanne Holmes 10-30-1945  [id number].    Requesting MD: Reubin Milan, MD Chief Complaint/Reason for Consult: Concern for ischemic colitis  HPI:  Joanne Holmes is a 76 yo female with PMH of HTN, COPD, remote hemorrhoids, PSH hysterectomy, who presented early AM 7/17 with several hours of nausea, vomiting, and diarrhea which had progressed to hematochezia. CT scan demonstrated "Moderate to severe diffuse edema/thickening of the descending colonic wall extending to the rectosigmoid with moderate pericolonic inflammatory stranding". General surgery was thus consulted to evaluate for ischemic colitis. Patient reports she has not had any hematochezia in recent memory until now. She reports pain was 8-10/10 and "very tender." She reports no bowel function or vomiting since arriving to the hospital.  In the ED, patient was initial tachycardic to 110 with hypertension, but is now in normal sinus rhythm after crystalloid resuscitation and one dose of levofloxacin/metronidazole. Labs notable for WBC 21.9, and lactate 2.9-->2.6 after fluids  ROS: ROS  Family History  Problem Relation Age of Onset   CAD Mother    Cancer Father        lymphosarcoma   Breast cancer Neg Hx     Past Medical History:  Diagnosis Date   Allergy    Asthma    COPD (chronic obstructive pulmonary disease) (HCC)    Hypertension    Kidney stone    Neuromuscular disorder (HCC)    carpal tunnel- left   Pneumonia    Spinal stenosis     Past Surgical History:  Procedure Laterality Date   ABDOMINAL HYSTERECTOMY     BACK SURGERY  25+ yrs ago   lumbar laminectomy L5-S1   BLADDER SUSPENSION  ~10 yrs ago   CARPAL TUNNEL RELEASE  03/08/2012   Procedure: CARPAL TUNNEL RELEASE;  Surgeon: Cammie Sickle., MD;  Location: [facility];  Service: Orthopedics;  Laterality: Left;   SALPINGOOPHORECTOMY  ~8 yrs ago   TONSILLECTOMY      Social History:   reports that she quit smoking about 40 years ago. Her smoking use included cigarettes. She has a 40.00 pack-year smoking history. She has never used smokeless tobacco. She reports that she does not drink alcohol and does not use drugs.  Allergies:  Allergies  Allergen Reactions   Ceclor [Cefaclor] Other (See Comments)    Pt does not remember   Cefzil [Cefprozil] Other (See Comments)    Pt does not remember   Erythromycin Itching and Rash   Griseofulvin Swelling   Morphine And Related Itching   Penicillins Anaphylaxis    Has patient had a PCN reaction causing immediate rash, facial/tongue/throat swelling, SOB or lightheadedness with hypotension: Yes Has patient had a PCN reaction causing severe rash involving mucus membranes or skin necrosis: No Has patient had a PCN reaction that required hospitalization: No Has patient had a PCN reaction occurring within the last 10 years: No If all of the above answers are "NO", then may proceed with Cephalosporin use.   Morpholine Salicylate     (Not in a hospital admission)   Blood pressure (!) 189/75, pulse 87, temperature 99.4 F (37.4 C), temperature source Oral, resp. rate 14, height '5\' 2"'$  (1.575 m), weight 86.2 kg, SpO2 94 %. Physical Exam: Constitutional: Some distress; conversant; no deformities Eyes: Anicteric, moist conjunctivae Neck: Trachea midline; no thyromegaly Lungs: Normal respiratory effort; symmetric chest rise CV: RRR GI: Abd soft, nondistended, exquisitely tender in bilateral lower quadrants MSK: MAE, No clubbing/cyanosis Psychiatric: Appropriate  affect; alert and oriented x3 Lymphatic: No palpable cervical or axillary lymphadenopathy  Results for orders placed or performed during the hospital encounter of 10/10/21 (from the past 48 hour(s))  Sample to Blood Bank     Status: None   Collection Time: 10/10/21  3:25 AM  Result Value Ref Range   Blood Bank Specimen SAMPLE AVAILABLE FOR TESTING    Sample Expiration       10/13/2021,2359 Performed at [facility], [place] [address], [place], [place] [address]   Comprehensive metabolic panel     Status: Abnormal   Collection Time: 10/10/21  3:35 AM  Result Value Ref Range   Sodium 141 135 - 145 mmol/L   Potassium 4.0 3.5 - 5.1 mmol/L   Chloride 109 98 - 111 mmol/L   CO2 19 (L) 22 - 32 mmol/L   Glucose, Bld 212 (H) 70 - 99 mg/dL    Comment: Glucose reference range applies only to samples taken after fasting for at least 8 hours.   BUN 47 (H) 8 - 23 mg/dL   Creatinine, Ser 2.26 (H) 0.44 - 1.00 mg/dL   Calcium 10.0 8.9 - 10.3 mg/dL   Total Protein 7.6 6.5 - 8.1 g/dL   Albumin 4.3 3.5 - 5.0 g/dL   AST 27 15 - 41 U/L   ALT 23 0 - 44 U/L   Alkaline Phosphatase 106 38 - 126 U/L   Total Bilirubin 0.3 0.3 - 1.2 mg/dL   GFR, Estimated 22 (L) >60 mL/min    Comment: (NOTE) Calculated using the CKD-EPI Creatinine Equation (2021)    Anion gap 13 5 - 15    Comment: Performed at [facility], [place] [address], [place], [place] [address]  Lipase, blood     Status: None   Collection Time: 10/10/21  3:35 AM  Result Value Ref Range   Lipase 31 11 - 51 U/L    Comment: Performed at [facility], [place] [address], [place], [place] [address]  CBC with Differential     Status: Abnormal   Collection Time: 10/10/21  3:35 AM  Result Value Ref Range   WBC 21.9 (H) 4.0 - 10.5 K/uL   RBC 5.01 3.87 - 5.11 MIL/uL   Hemoglobin 14.5 12.0 - 15.0 g/dL   HCT 43.2 36.0 - 46.0 %   MCV 86.2 80.0 - 100.0 fL   MCH 28.9 26.0 - 34.0 pg   MCHC 33.6 30.0 - 36.0 g/dL   RDW 14.9 11.5 - 15.5 %   Platelets 257 150 - 400 K/uL   nRBC 0.0 0.0 - 0.2 %   Neutrophils Relative % 88 %   Neutro Abs 19.3 (H) 1.7 - 7.7 K/uL   Lymphocytes Relative 4 %   Lymphs Abs 0.9 0.7 - 4.0 K/uL   Monocytes Relative 7 %   Monocytes Absolute 1.5 (H) 0.1 - 1.0 K/uL   Eosinophils Relative 0 %   Eosinophils Absolute 0.0 0.0 - 0.5 K/uL   Basophils Relative 0 %    Basophils Absolute 0.1 0.0 - 0.1 K/uL   Immature Granulocytes 1 %   Abs Immature Granulocytes 0.11 (H) 0.00 - 0.07 K/uL    Comment: Performed at [facility], [place] [address], [place],  [address]  Culture, blood (routine x 2)     Status: None (Preliminary result)   Collection Time: 10/10/21  3:35 AM   Specimen: BLOOD  Result Value Ref Range   Specimen Description  BLOOD BLOOD RIGHT FOREARM Performed at [facility] [address] [place], [place] [address]    Special Requests      BOTTLES DRAWN AEROBIC AND ANAEROBIC Blood Culture adequate volume Performed at [facility] [address] [place], [place] [address]    Culture      NO GROWTH < 12 HOURS Performed at [facility] [place] [place] [address] [place], [place] [address]    Report Status PENDING   Lactic acid, plasma     Status: Abnormal   Collection Time: 10/10/21  4:15 AM  Result Value Ref Range   Lactic Acid, Venous 2.9 (HH) 0.5 - 1.9 mmol/L    Comment: CRITICAL RESULT CALLED TO, READ BACK BY AND VERIFIED WITH CARTER,S. 10/10/21 '@0604'$  BY SEEL,M. Performed at [facility], [place] [address] [place], [place] [address]   Culture, blood (routine x 2)     Status: None (Preliminary result)   Collection Time: 10/10/21  4:15 AM   Specimen: BLOOD  Result Value Ref Range   Specimen Description      BLOOD LEFT ANTECUBITAL Performed at [facility], [place] [address], [place], [place] [address]    Special Requests      BOTTLES DRAWN AEROBIC ONLY Blood Culture results may not be optimal due to an inadequate volume of blood received in culture bottles Performed at [facility] [address] [place], [place] [address]    Culture      NO GROWTH < 12 HOURS Performed at [facility] [address], [place], [place] [address]    Report Status PENDING   Lactic acid, plasma     Status: Abnormal    Collection Time: 10/10/21  5:35 AM  Result Value Ref Range   Lactic Acid, Venous 2.6 (HH) 0.5 - 1.9 mmol/L    Comment: CRITICAL VALUE NOTED. VALUE IS CONSISTENT WITH PREVIOUSLY REPORTED/CALLED VALUE Performed at [facility] [address] [place], [place] [address]   Lactic acid, plasma     Status: Abnormal   Collection Time: 10/10/21 12:51 PM  Result Value Ref Range   Lactic Acid, Venous 2.6 (HH) 0.5 - 1.9 mmol/L    Comment: CRITICAL RESULT CALLED TO, READ BACK BY AND VERIFIED WITH BOWEN M. RN AT 10/10/2021 BY MECIAL J. Performed at [facility], [place] [address] [place],  [address]    CT ABDOMEN PELVIS WO CONTRAST  Result Date: 10/10/2021 CLINICAL DATA:  Mesenteric ischemia, acute; abdominal pain all over with rectal bleeding EXAM: CT ABDOMEN AND PELVIS WITHOUT CONTRAST TECHNIQUE: Multidetector CT imaging of the abdomen and pelvis was performed following the standard protocol without IV contrast. RADIATION DOSE REDUCTION: This exam was performed according to the departmental dose-optimization program which includes automated exposure control, adjustment of the mA and/or kV according to patient size and/or use of iterative reconstruction technique. COMPARISON:  May 10, 2014 FINDINGS: Lower chest: There is a minor atelectasis versus pleuropulmonary scarring seen at the left lung base. No discrete nodule. Hepatobiliary: No focal liver abnormality is seen. No gallstones, gallbladder wall thickening, or biliary dilatation. Gallbladder is contracted. No portal venous gas. Pancreas: Fatty infiltration of the pancreas. No pancreatic ductal dilatation or surrounding inflammatory changes. Spleen: Normal in size without focal abnormality. Adrenals/Urinary Tract: Adrenal glands are unremarkable. Kidneys are normal, without renal calculi, or focal lesion. Mild prominence of the ureters bilateral greater on the left without filling defect. Bilateral renal  vascular  calcifications. Previously seen perinephric stranding has resolved. Bladder is unremarkable. Stomach/Bowel: Small hiatal hernia. Stomach is within normal limits. Appendix appears normal. There is diffuse moderately severe bowel wall thickening of the descending colon seen extending to the rectosigmoid. There is moderate pericolonic inflammatory stranding seen. No evidence of pneumatosis intestinalis. Vascular/Lymphatic: There are extensive atheromatous calcifications of the abdominal aorta seen extending to the iliac arteries Reproductive: Prostate is unremarkable. Other: There is small fluid seen at the dependent pelvis greater on the left (image 70/2) Musculoskeletal: Moderate lumbar spondylosis with grade 1 anterolisthesis at L3-L4 and L4-L5 with vacuum disc phenomenon at L3-L4 through L5-S1. IMPRESSION: 1. Moderate to severe diffuse edema/thickening of the descending colonic wall extending to the rectosigmoid with moderate pericolonic inflammatory stranding. Small fluid in the dependent pelvis at the left side. Tiny fluid at the left upper paracolic gutter (image 44/9). The differential considerations are ischemic colitis, inflammatory colitis and less likely colonic diverticulitis. No pneumatosis intestinalis or portal venous gas. 2. Extensive atheromatous calcifications of the abdominal aorta extending into the iliac arteries. 3.  Smaller hiatal hernia. Electronically Signed   By: Frazier Richards M.D.   On: 10/10/2021 07:59      Assessment/Plan  Mrs. Hollin presents to the Emergency Department with severe abdominal pain, leukocytosis and CT scan showing diffuse left colon thickening and pelvic stranding. Patient is currently hemodynamically stable. No sign of pneumatosis or PVG. No free air. No drainable fluid collection. However, given extensive abominal aortic calcifications on CT scan, as well as relatively acute onset of abdominal pain and hematochezia, ischemic colitis is  suspected.  Recommendations:  1.  Agree with admission to medicine 2.  NPO, bowel rest, IVF, pain control, antiemetics, continue antibiotics 3.  Serial examinations while on antibiotics 4.  Will require surgical management if not improving on antibiotics  Clerance Lav, MD General Surgery Resident [facility] Surgery Please see Amion for pager number during day hours 7:00am-4:30pm 10/10/2021, 1:43 PM

## 2021-10-10 NOTE — ED Provider Notes (Signed)
[facility] DEPT  Provider Note  CSN: [id number] Arrival date & time: 10/10/21 6759  History Chief Complaint  Patient presents with   Abdominal Pain    Joanne Holmes is a 76 y.o. female reports onset of nausea, vomiting and diarrhea a few hours ago, with moderate aching lower abdominal pain. She noticed that her diarrhea had turned bloody prompting her to come to the ED via EMS. She has had chills but no measured fever. Husband at bedside corroborates history. She has remote history of hemorrhoids. Not on blood thinners.    Home Medications Prior to Admission medications   Medication Sig Start Date End Date Taking? Authorizing Provider  albuterol (PROVENTIL) (2.5 MG/3ML) 0.083% nebulizer solution Inhale 3 mLs into the lungs every 4 (four) hours as needed. 12/04/16  Yes [provider]  amLODipine (NORVASC) 10 MG tablet Take 10 mg by mouth daily.   Yes [provider]  atorvastatin (LIPITOR) 40 MG tablet Take 40 mg by mouth at bedtime.  02/22/16 10/10/21 Yes [provider]  benazepril (LOTENSIN) 40 MG tablet Take 40 mg by mouth 2 (two) times daily. 03/06/16  Yes [provider]  Cholecalciferol (VITAMIN D3) 2000 units capsule Take 2,000 Units by mouth at bedtime.    Yes [provider]  fexofenadine (ALLEGRA) 180 MG tablet Take by mouth. 09/28/19  Yes [provider]  furosemide (LASIX) 40 MG tablet Take 1 tablet (40 mg total) by mouth daily. Patient taking differently: Take 20 mg by mouth daily. 12/23/16 10/10/21 Yes Georgette Shell, MD  HYDROcodone-acetaminophen (NORCO/VICODIN) 5-325 MG per tablet Take 1 tablet by mouth every 4 (four) hours as needed for moderate pain or severe pain.  03/08/12  Yes Dasnoit, Herbie Baltimore, PA-C  metoprolol succinate (TOPROL-XL) 50 MG 24 hr tablet Take 50 mg by mouth daily. 10/07/21  Yes [provider]  montelukast (SINGULAIR) 10 MG tablet Take 10 mg by mouth at  bedtime.   Yes [provider]  pantoprazole (PROTONIX) 40 MG tablet Take 40 mg by mouth daily.   Yes [provider]  traMADol (ULTRAM) 50 MG tablet Take 50 mg by mouth 3 (three) times daily. pain   Yes [provider]  TRELEGY ELLIPTA 200-62.5-25 MCG/INH AEPB Inhale 1 puff into the lungs daily. 11/26/19  Yes [provider]  irbesartan (AVAPRO) 150 MG tablet Take 1 tablet (150 mg total) by mouth daily. Patient not taking: Reported on 10/10/2021 12/24/16   Georgette Shell, MD     Allergies    Ceclor [cefaclor], Cefzil [cefprozil], Erythromycin, Griseofulvin, Morphine and related, Penicillins, and Morpholine salicylate   Review of Systems   Review of Systems Please see HPI for pertinent positives and negatives  Physical Exam BP (!) 192/86   Pulse 100   Temp 99.5 F (37.5 C) (Oral)   Resp 16   SpO2 97%   Physical Exam Vitals and nursing note reviewed.  Constitutional:      Appearance: Normal appearance.  HENT:     Head: Normocephalic and atraumatic.     Nose: Nose normal.     Mouth/Throat:     Mouth: Mucous membranes are moist.  Eyes:     Extraocular Movements: Extraocular movements intact.     Conjunctiva/sclera: Conjunctivae normal.  Cardiovascular:     Rate and Rhythm: Tachycardia present.  Pulmonary:     Effort: Pulmonary effort is normal.     Breath sounds: Normal breath sounds.  Abdominal:     Palpations:  Abdomen is soft.     Tenderness: There is abdominal tenderness in the right lower quadrant, suprapubic area and left lower quadrant. There is guarding. Negative signs include Murphy's sign and McBurney's sign.  Musculoskeletal:        General: No swelling. Normal range of motion.     Cervical back: Neck supple.  Skin:    General: Skin is warm and dry.  Neurological:     General: No focal deficit present.     Mental Status: She is alert.  Psychiatric:        Mood and Affect: Mood normal.     ED Results / Procedures /  Treatments   EKG None  Procedures Procedures  Medications Ordered in the ED Medications  sodium chloride (PF) 0.9 % injection (  Not Given 10/10/21 0547)  lactated ringers bolus 1,000 mL (0 mLs Intravenous Stopped 10/10/21 0614)  ondansetron (ZOFRAN) injection 4 mg (4 mg Intravenous Given 10/10/21 0347)  fentaNYL (SUBLIMAZE) injection 50 mcg (50 mcg Intravenous Given 10/10/21 0430)  acetaminophen (TYLENOL) tablet 650 mg (650 mg Oral Given 10/10/21 0438)  levofloxacin (LEVAQUIN) IVPB 750 mg (0 mg Intravenous Stopped 10/10/21 0727)  metroNIDAZOLE (FLAGYL) IVPB 500 mg (0 mg Intravenous Stopped 10/10/21 0545)  iohexol (OMNIPAQUE) 9 MG/ML oral solution 500 mL (500 mLs Oral Contrast Given 10/10/21 0658)  iohexol (OMNIPAQUE) 9 MG/ML oral solution (  Contrast Given 10/10/21 0547)  fentaNYL (SUBLIMAZE) injection 50 mcg (50 mcg Intravenous Given 10/10/21 0602)  lactated ringers bolus 1,000 mL (0 mLs Intravenous Stopped 10/10/21 0727)    Initial Impression and Plan  Patient with several hours of N/V and bloody diarrhea with lower abdominal pain and fever. Will check labs, including lactic acid and cultures. Send for CTA. Pain/nausea meds and IVF for comfort.   ED Course   Clinical Course as of 10/10/21 0735  Mon Oct 10, 2021  0346 CBC with elevated WBC, normal Hgb. Meets sepsis criteria, will begin Abx for abdominal source.  [CS]  2130 CMP with elevated Creatinine worsened from baseline of 1.7. Lipase is normal. Will need to have a CT without IV contrast. She has not had  bleeding since arrival. Remains hemodynamically stable.  [CS]  0606 Lactic acid is mildly elevated, will recheck after second liter of IVF.  [CS]  [phone] Care of the patient signed out to Dr. Tomi Bamberger at the change of shift pending CT and admission.  [CS]    Clinical Course User Index [CS] Truddie Hidden, MD     MDM Rules/Calculators/A&P Medical Decision Making Amount and/or Complexity of Data Reviewed Labs: ordered.  Decision-making details documented in ED Course. Radiology: ordered and independent interpretation performed. Decision-making details documented in ED Course.  Risk OTC drugs. Prescription drug management.    Final Clinical Impression(s) / ED Diagnoses Final diagnoses:  None    Rx / DC Orders ED Discharge Orders     None        Truddie Hidden, MD 10/10/21 [phone]

## 2021-10-10 NOTE — ED Triage Notes (Signed)
Pt. BIB [facility] from home c/o abdominal pain associated with NVD. Pt. Noticed bleeding with her BM.   EMS VS:  HR: 110 O2: 99% CBG: 215 BP: 220/98

## 2021-10-10 NOTE — H&P (Signed)
History and Physical    Patient: Joanne Holmes ZLD:[id number] DOB: [birth year] DOA: 10/10/2021 DOS: the patient was seen and examined on 10/10/2021 PCP: Caren Macadam, MD  Patient coming from: Home  Chief Complaint:  Chief Complaint  Patient presents with   Abdominal Pain   HPI: GERLEAN CID is a 76 y.o. female with medical history significant of seasonal allergies, asthma,/COPD, hypertension, nephrolithiasis, left-sided carpal tunnel, history of pneumonia, lumbar spinal stenosis, postlaminectomy syndrome, class I obesity who presented to the emergency department due to abdominal pain associated with nausea, vomiting and diarrhea with most recent loose stools being bloody after she ate lasagna, garlic knots and solid yesterday evening.  No constipation or melena history.  No flank pain, dysuria, frequency or hematuria.She has had chills, but denied fever, rhinorrhea, sore throat, wheezing or hemoptysis.  No chest pain, palpitations, diaphoresis, PND, orthopnea or pitting edema of the lower extremities.   No polyuria, polydipsia, polyphagia or blurred vision.   ED course: Initial vital signs were temperature 100 F, pulse 107, respiration 15, BP 219/91 mmHg O2 sat 95% on room air.  The patient received ondansetron 4 mg IVP LR 2000 mL bolus fentanyl 50 mcg IVP x2 and acetaminophen 650 mg p.o. x1 I added metoprolol to tartrate 5 mg IVP, hydralazine 50 mg IVP, LR bolus 1000 mL over 2 hours.  Lab work: Her CBC showed a white count 21.9, hemoglobin 14.5 g/dL and platelets 257.  CMP showed a CO2 of 19 mmol/L, the rest of the electrolytes and LFTs were normal.  Glucose 212, BUN 47 creatinine 2.26 mg/dL.  Lipase was normal.  Lactic acid was 2.9 then 2.6 then 2.6 mmol/L.  Imaging: CT abdomen/pelvis without contrast suspicious for the ascending and rectosigmoid colon ischemic versus inflammatory or less likely infectious colitis.   Review of Systems: As mentioned in the history of present illness.  All other systems reviewed and are negative.  Past Medical History:  Diagnosis Date   Allergy    Asthma    COPD (chronic obstructive pulmonary disease) ([place])    Hypertension    Kidney stone    Neuromuscular disorder (HCC)    carpal tunnel- left   Pneumonia    Spinal stenosis    Past Surgical History:  Procedure Laterality Date   ABDOMINAL HYSTERECTOMY     BACK SURGERY  25+ yrs ago   lumbar laminectomy L5-S1   BLADDER SUSPENSION  ~10 yrs ago   CARPAL TUNNEL RELEASE  03/08/2012   Procedure: CARPAL TUNNEL RELEASE;  Surgeon: Cammie Sickle., MD;  Location: [facility];  Service: Orthopedics;  Laterality: Left;   SALPINGOOPHORECTOMY  ~8 yrs ago   TONSILLECTOMY     Social History:  reports that she quit smoking about 40 years ago. Her smoking use included cigarettes. She has a 40.00 pack-year smoking history. She has never used smokeless tobacco. She reports that she does not drink alcohol and does not use drugs.  Allergies  Allergen Reactions   Ceclor [Cefaclor] Other (See Comments)    Pt does not remember   Cefzil [Cefprozil] Other (See Comments)    Pt does not remember   Erythromycin Itching and Rash   Griseofulvin Swelling   Morphine And Related Itching   Penicillins Anaphylaxis    Has patient had a PCN reaction causing immediate rash, facial/tongue/throat swelling, SOB or lightheadedness with hypotension: Yes Has patient had a PCN reaction causing severe rash involving mucus membranes or skin necrosis: No Has patient had a PCN  reaction that required hospitalization: No Has patient had a PCN reaction occurring within the last 10 years: No If all of the above answers are "NO", then may proceed with Cephalosporin use.   Morpholine Salicylate     Family History  Problem Relation Age of Onset   CAD Mother    Cancer Father        lymphosarcoma   Breast cancer Neg Hx     Prior to Admission medications   Medication Sig Start Date End Date Taking?  Authorizing Provider  albuterol (PROVENTIL) (2.5 MG/3ML) 0.083% nebulizer solution Inhale 3 mLs into the lungs every 4 (four) hours as needed. 12/04/16  Yes [provider]  amLODipine (NORVASC) 10 MG tablet Take 10 mg by mouth daily.   Yes [provider]  atorvastatin (LIPITOR) 40 MG tablet Take 40 mg by mouth at bedtime.  02/22/16 10/10/21 Yes [provider]  benazepril (LOTENSIN) 40 MG tablet Take 40 mg by mouth 2 (two) times daily. 03/06/16  Yes [provider]  Cholecalciferol (VITAMIN D3) 2000 units capsule Take 2,000 Units by mouth at bedtime.    Yes [provider]  fexofenadine (ALLEGRA) 180 MG tablet Take by mouth. 09/28/19  Yes [provider]  furosemide (LASIX) 40 MG tablet Take 1 tablet (40 mg total) by mouth daily. Patient taking differently: Take 20 mg by mouth daily. 12/23/16 10/10/21 Yes Georgette Shell, MD  HYDROcodone-acetaminophen (NORCO/VICODIN) 5-325 MG per tablet Take 1 tablet by mouth every 4 (four) hours as needed for moderate pain or severe pain.  03/08/12  Yes Dasnoit, Herbie [place], PA-C  metoprolol succinate (TOPROL-XL) 50 MG 24 hr tablet Take 50 mg by mouth daily. 10/07/21  Yes [provider]  montelukast (SINGULAIR) 10 MG tablet Take 10 mg by mouth at bedtime.   Yes [provider]  pantoprazole (PROTONIX) 40 MG tablet Take 40 mg by mouth daily.   Yes [provider]  traMADol (ULTRAM) 50 MG tablet Take 50 mg by mouth 3 (three) times daily. pain   Yes [provider]  TRELEGY ELLIPTA 200-62.5-25 MCG/INH AEPB Inhale 1 puff into the lungs daily. 11/26/19  Yes [provider]  irbesartan (AVAPRO) 150 MG tablet Take 1 tablet (150 mg total) by mouth daily. Patient not taking: Reported on 10/10/2021 12/24/16   Georgette Shell, MD    Physical Exam: Vitals:   10/10/21 1030 10/10/21 1100 10/10/21 1130 10/10/21 1133  BP: (!) 202/108 (!) 210/88 (!) 189/75 (!) 189/75  Pulse: 86 89  87   Resp: '12 12 14   '$ Temp:  99.4 F (37.4 C)    TempSrc:  Oral    SpO2: 95% 95% 94%   Weight:      Height:       Physical Exam Vitals reviewed.  Constitutional:      General: She is awake.     Appearance: She is well-developed. She is obese. She is ill-appearing.  HENT:     Head: Normocephalic.     Mouth/Throat:     Mouth: Mucous membranes are dry.     Pharynx: Oropharynx is clear.  Eyes:     General: No scleral icterus.    Pupils: Pupils are equal, round, and reactive to light.  Neck:     Vascular: No JVD.  Cardiovascular:     Rate and Rhythm: Normal rate and regular rhythm.     Heart sounds: S1 normal and S2 normal.  Pulmonary:     Effort: Pulmonary effort is  normal.     Breath sounds: Normal breath sounds. No wheezing, rhonchi or rales.  Abdominal:     General: Abdomen is flat. There is no distension.     Palpations: Abdomen is soft.     Tenderness: There is abdominal tenderness in the left lower quadrant. There is left CVA tenderness. There is no right CVA tenderness.  Musculoskeletal:     Cervical back: Neck supple.     Right lower leg: No edema.     Left lower leg: No edema.  Skin:    General: Skin is warm and dry.  Neurological:     General: No focal deficit present.     Mental Status: She is alert and oriented to person, place, and time.  Psychiatric:        Mood and Affect: Mood normal.        Behavior: Behavior normal. Behavior is cooperative.    Data Reviewed:  There are no new results to review at this time.  Assessment and Plan: Principal Problem:   Acute colitis Observation/MedSurg. Keep NPO. Continue IV fluids. Analgesics as needed. Antiemetics as needed. Pantoprazole 40 mg IVP every 24 hours. Continue levofloxacin and metronidazole. Keep electrolytes optimized. Follow-up CBC and CMP in AM. General surgery input appreciated.  Active Problems:   Essential hypertension With   Hypertensive urgency Oral medications held. Pain  control. Metoprolol 5 mg IVP twice daily. Hydralazine 10 mg every 4 hours as needed.    AKI (acute kidney injury) ([place]) Continue IV fluids. Hold ARB/ACE. Hold diuretic. Avoid hypotension. Avoid nephrotoxins. Monitor intake and output. Monitor renal function electrolytes.    Class 1 obesity BMI is 34.75 kg/m. High risk for cardiovascular complications. Will need lifestyle modifications. Follow-up closely with PCP.    Hyperglycemia Currently NPO. CBG monitoring every 6 hours. Follow-up fasting glucose. Check hemoglobin A1c if still elevated.    COPD (chronic obstructive pulmonary disease) (HCC) No signs of decompensation at this time. Continue Trelegy Ellipta. Supplemental oxygen as needed. Nebulized SABA as needed.    Advance Care Planning:   Code Status: Full Code   Consults: General surgery ( Dr. Ninfa Linden).  Family Communication:   Severity of Illness: The appropriate patient status for this patient is OBSERVATION. Observation status is judged to be reasonable and necessary in order to provide the required intensity of service to ensure the patient's safety. The patient's presenting symptoms, physical exam findings, and initial radiographic and laboratory data in the context of their medical condition is felt to place them at decreased risk for further clinical deterioration. Furthermore, it is anticipated that the patient will be medically stable for discharge from the hospital within 2 midnights of admission.   Author: Reubin Milan, MD 10/10/2021 1:41 PM  For on call review [email or web address].   This document was prepared using Dragon voice recognition software and may contain some unintended transcription errors.

## 2021-10-10 NOTE — ED Provider Notes (Addendum)
Patient initially seen by Dr. Karle Starch.  Please see his notes.  CT scan does show findings suggestive of colitis and less likely diverticulitis.  Initial lactic acid slightly elevated although decreasing.  Favor infectious etiology over ischemic.  Patient has been started on antibiotics.  I will consult the medical service for admission  Case discussed with Dr. Olevia Bowens regarding admission.  I will also consult with general surgery considering her findings of possible ischemic etiology  Case discussed with Claiborne Billings, general surgery. Will see pt   Dorie Rank, MD 10/10/21 1096    Dorie Rank, MD 10/10/21 [phone]

## 2021-10-11 DIAGNOSIS — I160 Hypertensive urgency: Secondary | ICD-10-CM | POA: Diagnosis not present

## 2021-10-11 DIAGNOSIS — Z79899 Other long term (current) drug therapy: Secondary | ICD-10-CM | POA: Diagnosis not present

## 2021-10-11 DIAGNOSIS — I129 Hypertensive chronic kidney disease with stage 1 through stage 4 chronic kidney disease, or unspecified chronic kidney disease: Secondary | ICD-10-CM | POA: Diagnosis not present

## 2021-10-11 DIAGNOSIS — A09 Infectious gastroenteritis and colitis, unspecified: Secondary | ICD-10-CM | POA: Diagnosis not present

## 2021-10-11 DIAGNOSIS — E669 Obesity, unspecified: Secondary | ICD-10-CM | POA: Diagnosis not present

## 2021-10-11 DIAGNOSIS — Z90710 Acquired absence of both cervix and uterus: Secondary | ICD-10-CM | POA: Diagnosis not present

## 2021-10-11 DIAGNOSIS — Z6834 Body mass index (BMI) 34.0-34.9, adult: Secondary | ICD-10-CM | POA: Diagnosis not present

## 2021-10-11 DIAGNOSIS — Z8249 Family history of ischemic heart disease and other diseases of the circulatory system: Secondary | ICD-10-CM | POA: Diagnosis not present

## 2021-10-11 DIAGNOSIS — K529 Noninfective gastroenteritis and colitis, unspecified: Principal | ICD-10-CM | POA: Diagnosis present

## 2021-10-11 DIAGNOSIS — E876 Hypokalemia: Secondary | ICD-10-CM | POA: Diagnosis not present

## 2021-10-11 DIAGNOSIS — Z807 Family history of other malignant neoplasms of lymphoid, hematopoietic and related tissues: Secondary | ICD-10-CM | POA: Diagnosis not present

## 2021-10-11 DIAGNOSIS — N179 Acute kidney failure, unspecified: Secondary | ICD-10-CM | POA: Diagnosis not present

## 2021-10-11 DIAGNOSIS — M48061 Spinal stenosis, lumbar region without neurogenic claudication: Secondary | ICD-10-CM | POA: Diagnosis not present

## 2021-10-11 DIAGNOSIS — Z7951 Long term (current) use of inhaled steroids: Secondary | ICD-10-CM | POA: Diagnosis not present

## 2021-10-11 DIAGNOSIS — J45909 Unspecified asthma, uncomplicated: Secondary | ICD-10-CM | POA: Diagnosis not present

## 2021-10-11 DIAGNOSIS — K559 Vascular disorder of intestine, unspecified: Secondary | ICD-10-CM | POA: Diagnosis not present

## 2021-10-11 DIAGNOSIS — I10 Essential (primary) hypertension: Secondary | ICD-10-CM | POA: Diagnosis not present

## 2021-10-11 DIAGNOSIS — N1831 Chronic kidney disease, stage 3a: Secondary | ICD-10-CM | POA: Diagnosis not present

## 2021-10-11 DIAGNOSIS — Z87891 Personal history of nicotine dependence: Secondary | ICD-10-CM | POA: Diagnosis not present

## 2021-10-11 DIAGNOSIS — K5289 Other specified noninfective gastroenteritis and colitis: Secondary | ICD-10-CM | POA: Diagnosis not present

## 2021-10-11 DIAGNOSIS — M4316 Spondylolisthesis, lumbar region: Secondary | ICD-10-CM | POA: Diagnosis not present

## 2021-10-11 DIAGNOSIS — J449 Chronic obstructive pulmonary disease, unspecified: Secondary | ICD-10-CM | POA: Diagnosis not present

## 2021-10-11 DIAGNOSIS — R739 Hyperglycemia, unspecified: Secondary | ICD-10-CM | POA: Diagnosis not present

## 2021-10-11 LAB — COMPREHENSIVE METABOLIC PANEL
ALT: 15 U/L (ref 0–44)
AST: 17 U/L (ref 15–41)
Albumin: 2.9 g/dL — ABNORMAL LOW (ref 3.5–5.0)
Alkaline Phosphatase: 63 U/L (ref 38–126)
Anion gap: 7 (ref 5–15)
BUN: 20 mg/dL (ref 8–23)
CO2: 22 mmol/L (ref 22–32)
Calcium: 8.9 mg/dL (ref 8.9–10.3)
Chloride: 109 mmol/L (ref 98–111)
Creatinine, Ser: 1.3 mg/dL — ABNORMAL HIGH (ref 0.44–1.00)
GFR, Estimated: 42 mL/min — ABNORMAL LOW (ref >60)
Glucose, Bld: 102 mg/dL — ABNORMAL HIGH (ref 70–99)
Potassium: 3.6 mmol/L (ref 3.5–5.1)
Sodium: 138 mmol/L (ref 135–145)
Total Bilirubin: 0.7 mg/dL (ref 0.3–1.2)
Total Protein: 5.4 g/dL — ABNORMAL LOW (ref 6.5–8.1)

## 2021-10-11 LAB — CBC
HCT: 34.4 % — ABNORMAL LOW (ref 36.0–46.0)
Hemoglobin: 11.1 g/dL — ABNORMAL LOW (ref 12.0–15.0)
MCH: 28.5 pg (ref 26.0–34.0)
MCHC: 32.3 g/dL (ref 30.0–36.0)
MCV: 88.2 fL (ref 80.0–100.0)
Platelets: 142 10*3/uL — ABNORMAL LOW (ref 150–400)
RBC: 3.9 MIL/uL (ref 3.87–5.11)
RDW: 15.3 % (ref 11.5–15.5)
WBC: 15.6 10*3/uL — ABNORMAL HIGH (ref 4.0–10.5)
nRBC: 0 % (ref 0.0–0.2)

## 2021-10-11 LAB — GLUCOSE, CAPILLARY
Glucose-Capillary: 105 mg/dL — ABNORMAL HIGH (ref 70–99)
Glucose-Capillary: 114 mg/dL — ABNORMAL HIGH (ref 70–99)
Glucose-Capillary: 99 mg/dL (ref 70–99)

## 2021-10-11 LAB — LACTIC ACID, PLASMA
Lactic Acid, Venous: 0.9 mmol/L (ref 0.5–1.9)
Lactic Acid, Venous: 0.7 mmol/L (ref 0.5–1.9)

## 2021-10-11 MED ORDER — METOPROLOL TARTRATE 5 MG/5ML IV SOLN
5.0000 mg | Freq: Three times a day (TID) | INTRAVENOUS | Status: DC
  Administered 2021-10-11 – 2021-10-12 (×3): 5 mg via INTRAVENOUS
  Filled 2021-10-11 (×3): qty 5

## 2021-10-11 MED ORDER — LEVOFLOXACIN IN D5W 500 MG/100ML IV SOLN
500.0000 mg | INTRAVENOUS | Status: DC
  Administered 2021-10-11 – 2021-10-12 (×2): 500 mg via INTRAVENOUS
  Filled 2021-10-11 (×3): qty 100

## 2021-10-11 MED ORDER — LACTATED RINGERS IV SOLN
INTRAVENOUS | Status: AC
Start: 2021-10-11 — End: 2021-10-12

## 2021-10-11 NOTE — Progress Notes (Signed)
Progress Note     Subjective: Pt reports she is still having LLQ abdominal pain, maybe slightly less severe than initially but requiring pain medication every few hrs. She is not having nausea or vomiting. She is passing flatus but has not passed any stool. Some blood on urinary wick this AM, unclear of source. Husband at bedside as well. Discussed that if we end up having to proceed to surgery then she will need a colostomy at least temporarily given extensive inflammation of colon. She understands this and is hopeful to avoid surgery but willing to proceed if not improving.   Objective: Vital signs in last 24 hours: Temp:  [98.4 F (36.9 C)-99.4 F (37.4 C)] 98.6 F (37 C) (07/18 0801) Pulse Rate:  [86-99] 98 (07/18 0801) Resp:  [11-20] 14 (07/18 0801) BP: (105-210)/(60-108) 189/60 (07/18 0801) SpO2:  [92 %-98 %] 94 % (07/18 0801) Weight:  [86.2 kg] 86.2 kg (07/17 1014) Last BM Date : 10/09/21  Intake/Output from previous day: 07/17 0701 - 07/18 0700 In: 1936.6 [I.V.:1736.6; IV Piggyback:200] Out: 2100 [Urine:2100] Intake/Output this shift: No intake/output data recorded.  PE: General: pleasant, WD, elderly female who is laying in bed and appears uncomfortable Heart: regular, rate, and rhythm.  Lungs: CTAB, no wheezes, rhonchi, or rales noted.  Respiratory effort nonlabored Abd: soft, ttp in LLQ and suprapubically, no peritonitis or rebound ttp, ND, +BS, no masses, hernias, or organomegaly MS: all 4 extremities are symmetrical with no cyanosis, clubbing, or edema. Skin: warm and dry with no masses, lesions, or rashes Psych: A&Ox3 with an appropriate affect.    Lab Results:  Recent Labs    10/10/21 0335 10/11/21 0517  WBC 21.9* 15.6*  HGB 14.5 11.1*  HCT 43.2 34.4*  PLT 257 142*   BMET Recent Labs    10/10/21 0335 10/11/21 0517  NA 141 138  K 4.0 3.6  CL 109 109  CO2 19* 22  GLUCOSE 212* 102*  BUN 47* 20  CREATININE 2.26* 1.30*  CALCIUM 10.0 8.9    PT/INR No results for input(s): "LABPROT", "INR" in the last 72 hours. CMP     Component Value Date/Time   NA 138 10/11/2021 0517   K 3.6 10/11/2021 0517   CL 109 10/11/2021 0517   CO2 22 10/11/2021 0517   GLUCOSE 102 (H) 10/11/2021 0517   BUN 20 10/11/2021 0517   CREATININE 1.30 (H) 10/11/2021 0517   CREATININE 0.98 08/16/2011 1224   CALCIUM 8.9 10/11/2021 0517   PROT 5.4 (L) 10/11/2021 0517   ALBUMIN 2.9 (L) 10/11/2021 0517   AST 17 10/11/2021 0517   ALT 15 10/11/2021 0517   ALKPHOS 63 10/11/2021 0517   BILITOT 0.7 10/11/2021 0517   GFRNONAA 42 (L) 10/11/2021 0517   GFRAA >60 12/22/2016 0432   Lipase     Component Value Date/Time   LIPASE 31 10/10/2021 0335       Studies/Results: CT ABDOMEN PELVIS WO CONTRAST  Result Date: 10/10/2021 CLINICAL DATA:  Mesenteric ischemia, acute; abdominal pain all over with rectal bleeding EXAM: CT ABDOMEN AND PELVIS WITHOUT CONTRAST TECHNIQUE: Multidetector CT imaging of the abdomen and pelvis was performed following the standard protocol without IV contrast. RADIATION DOSE REDUCTION: This exam was performed according to the departmental dose-optimization program which includes automated exposure control, adjustment of the mA and/or kV according to patient size and/or use of iterative reconstruction technique. COMPARISON:  May 10, 2014 FINDINGS: Lower chest: There is a minor atelectasis versus pleuropulmonary scarring seen at the  left lung base. No discrete nodule. Hepatobiliary: No focal liver abnormality is seen. No gallstones, gallbladder wall thickening, or biliary dilatation. Gallbladder is contracted. No portal venous gas. Pancreas: Fatty infiltration of the pancreas. No pancreatic ductal dilatation or surrounding inflammatory changes. Spleen: Normal in size without focal abnormality. Adrenals/Urinary Tract: Adrenal glands are unremarkable. Kidneys are normal, without renal calculi, or focal lesion. Mild prominence of the ureters  bilateral greater on the left without filling defect. Bilateral renal vascular calcifications. Previously seen perinephric stranding has resolved. Bladder is unremarkable. Stomach/Bowel: Small hiatal hernia. Stomach is within normal limits. Appendix appears normal. There is diffuse moderately severe bowel wall thickening of the descending colon seen extending to the rectosigmoid. There is moderate pericolonic inflammatory stranding seen. No evidence of pneumatosis intestinalis. Vascular/Lymphatic: There are extensive atheromatous calcifications of the abdominal aorta seen extending to the iliac arteries Reproductive: Prostate is unremarkable. Other: There is small fluid seen at the dependent pelvis greater on the left (image 70/2) Musculoskeletal: Moderate lumbar spondylosis with grade 1 anterolisthesis at L3-L4 and L4-L5 with vacuum disc phenomenon at L3-L4 through L5-S1. IMPRESSION: 1. Moderate to severe diffuse edema/thickening of the descending colonic wall extending to the rectosigmoid with moderate pericolonic inflammatory stranding. Small fluid in the dependent pelvis at the left side. Tiny fluid at the left upper paracolic gutter (image 81/0). The differential considerations are ischemic colitis, inflammatory colitis and less likely colonic diverticulitis. No pneumatosis intestinalis or portal venous gas. 2. Extensive atheromatous calcifications of the abdominal aorta extending into the iliac arteries. 3.  Smaller hiatal hernia. Electronically Signed   By: Frazier Richards M.D.   On: 10/10/2021 07:59    Anti-infectives: Anti-infectives (From admission, onward)    Start     Dose/Rate Route Frequency Ordered Stop   10/12/21 0600  levofloxacin (LEVAQUIN) IVPB 500 mg        500 mg 100 mL/hr over 60 Minutes Intravenous Every 48 hours 10/10/21 2105     10/11/21 0600  levofloxacin (LEVAQUIN) IVPB 500 mg  Status:  Discontinued        500 mg 100 mL/hr over 60 Minutes Intravenous Every 24 hours 10/10/21 1351  10/10/21 2105   10/10/21 1600  metroNIDAZOLE (FLAGYL) IVPB 500 mg        500 mg 100 mL/hr over 60 Minutes Intravenous Every 12 hours 10/10/21 1351     10/10/21 0400  levofloxacin (LEVAQUIN) IVPB 750 mg        750 mg 100 mL/hr over 90 Minutes Intravenous  Once 10/10/21 0349 10/10/21 0727   10/10/21 0400  metroNIDAZOLE (FLAGYL) IVPB 500 mg        500 mg 100 mL/hr over 60 Minutes Intravenous  Once 10/10/21 0349 10/10/21 0545        Assessment/Plan Colitis - ischemic vs infectious  - CT 7/17 with above and small free fluid within the pelvis and left paracolic gutter - WBC 15 from 21 on admit, repeat lactic pending this AM - pt still having a lot of ttp in LLQ but not peritonitic this AM - afebrile and HD stable  - will discuss with MD - pt would like to avoid going to surgery if possible with understanding that she would need a colostomy at least temporarily for a few months if she requires surgery this admission. But certainly concerned with tenderness that she may require surgical intervention. Keep NPO for now and on IV abx and we will continue to monitor closely.   FEN: NPO  VTE: ok to have  SQH or LMWH from a surgical standpoint  ID: flagyl/levaquin 7/18>>  - below per TRH -  COPD HTN Spinal stenosis   LOS: 0 days   I reviewed hospitalist notes, last 24 h vitals and pain scores, last 48 h intake and output, and last 24 h labs and trends.    Norm Parcel, [facility] Surgery 10/11/2021, 9:30 AM Please see Amion for pager number during day hours 7:00am-4:30pm

## 2021-10-11 NOTE — Plan of Care (Signed)
  Problem: Education: Goal: Knowledge of General Education information will improve Description Including pain rating scale, medication(s)/side effects and non-pharmacologic comfort measures Outcome: Progressing   

## 2021-10-11 NOTE — Progress Notes (Signed)
  Transition of Care [facility]) Screening Note   Patient Details  Name: Joanne Holmes Date of Birth: 1945-06-01   Transition of Care [facility]) CM/SW Contact:    Lennart Pall, LCSW Phone Number: 10/11/2021, 10:21 AM    Transition of Care Department [facility]) has reviewed patient and no TOC needs have been identified at this time. We will continue to monitor patient advancement through interdisciplinary progression rounds. If new patient transition needs arise, please place a TOC consult.

## 2021-10-11 NOTE — Progress Notes (Signed)
PROGRESS NOTE    Joanne Holmes  RUE:[id number] DOB: [birth year] DOA: 10/10/2021 PCP: Caren Macadam, MD    Brief Narrative:  Joanne Holmes is a 76 y.o. female with medical history significant of seasonal allergies, asthma,/COPD, hypertension, nephrolithiasis, left-sided carpal tunnel, history of pneumonia, lumbar spinal stenosis, postlaminectomy syndrome, class I obesity who presented to the emergency department due to abdominal pain associated with nausea, vomiting and diarrhea with most recent loose stools being bloody after she ate lasagna.  Found to have colitis.  Conservative treatment for now.   Assessment and Plan:  Acute colitis- unclear whether ischemic or infectious Keep NPO. Continue IV abx Analgesics as needed. Antiemetics as needed. Pantoprazole 40 mg IVP every 24 hours. General surgery input appreciated- if does not improve, may need surgery     Essential hypertension With Hypertensive urgency Oral medications held. Pain control. Metoprolol 5 mg IVP TID Hydralazine 10 mg every 4 hours as needed.     AKI (acute kidney injury) ([place]) Continue IV fluids. Hold ARB/ACE. Hold diuretic. Avoid hypotension. Avoid nephrotoxins. Monitor intake and output. Monitor renal function electrolytes.     Class 1 obesity Estimated body mass index is 34.75 kg/m as calculated from the following:   Height as of this encounter: '5\' 2"'$  (1.575 m).   Weight as of this encounter: 86.2 kg.      Hyperglycemia Currently NPO. CBG monitoring every 6 hours.     COPD (chronic obstructive pulmonary disease) (HCC) No signs of decompensation at this time. Continue Trelegy Ellipta. Supplemental oxygen as needed. Nebulized SABA as needed.    DVT prophylaxis: SCDs Start: 10/10/21 0943    Code Status: Full Code Family Communication:   Disposition Plan:  Level of care: Telemetry Status is: Observation The patient will require care spanning > 2 midnights and should be moved to  inpatient because: needs IVF and IV abx and may need surgery    Consultants:  GS   Subjective: Still with pain in LLQ  Objective: Vitals:   10/11/21 0055 10/11/21 0622 10/11/21 0753 10/11/21 0801  BP: (!) 167/64 (!) 167/61  (!) 189/60  Pulse: 94 98  98  Resp: '17 18  14  '$ Temp: 99 F (37.2 C) 98.9 F (37.2 C)  98.6 F (37 C)  TempSrc: Oral Oral  Oral  SpO2: 95% 92% 95% 94%  Weight:      Height:        Intake/Output Summary (Last 24 hours) at 10/11/2021 0943 Last data filed at 10/11/2021 0600 Gross per 24 hour  Intake 1936.56 ml  Output 2100 ml  Net -163.44 ml   Filed Weights   10/10/21 1014  Weight: 86.2 kg    Examination:   General: Appearance:    Obese female who appears uncomfortable     Lungs:     respirations unlabored  Heart:    Normal heart rate. Normal rhythm. No murmurs, rubs, or gallops.    MS:   All extremities are intact.    Neurologic:   Awake, alert, oriented x 3. No apparent focal neurological           defect.        Data Reviewed: I have personally reviewed following labs and imaging studies  CBC: Recent Labs  Lab 10/10/21 0335 10/11/21 0517  WBC 21.9* 15.6*  NEUTROABS 19.3*  --   HGB 14.5 11.1*  HCT 43.2 34.4*  MCV 86.2 88.2  PLT 257 147*   Basic Metabolic Panel: Recent Labs  Lab 10/10/21  0335 10/11/21 0517  NA 141 138  K 4.0 3.6  CL 109 109  CO2 19* 22  GLUCOSE 212* 102*  BUN 47* 20  CREATININE 2.26* 1.30*  CALCIUM 10.0 8.9   GFR: Estimated Creatinine Clearance: 36.3 mL/min (A) (by C-G formula based on SCr of 1.3 mg/dL (H)). Liver Function Tests: Recent Labs  Lab 10/10/21 0335 10/11/21 0517  AST 27 17  ALT 23 15  ALKPHOS 106 63  BILITOT 0.3 0.7  PROT 7.6 5.4*  ALBUMIN 4.3 2.9*   Recent Labs  Lab 10/10/21 0335  LIPASE 31   No results for input(s): "AMMONIA" in the last 168 hours. Coagulation Profile: No results for input(s): "INR", "PROTIME" in the last 168 hours. Cardiac Enzymes: No results for  input(s): "CKTOTAL", "CKMB", "CKMBINDEX", "TROPONINI" in the last 168 hours. BNP (last 3 results) No results for input(s): "PROBNP" in the last 8760 hours. HbA1C: No results for input(s): "HGBA1C" in the last 72 hours. CBG: Recent Labs  Lab 10/11/21 0052 10/11/21 0757  GLUCAP 105* 99   Lipid Profile: No results for input(s): "CHOL", "HDL", "LDLCALC", "TRIG", "CHOLHDL", "LDLDIRECT" in the last 72 hours. Thyroid Function Tests: No results for input(s): "TSH", "T4TOTAL", "FREET4", "T3FREE", "THYROIDAB" in the last 72 hours. Anemia Panel: No results for input(s): "VITAMINB12", "FOLATE", "FERRITIN", "TIBC", "IRON", "RETICCTPCT" in the last 72 hours. Sepsis Labs: Recent Labs  Lab 10/10/21 0415 10/10/21 0535 10/10/21 1251  LATICACIDVEN 2.9* 2.6* 2.6*    Recent Results (from the past 240 hour(s))  Culture, blood (routine x 2)     Status: None (Preliminary result)   Collection Time: 10/10/21  3:35 AM   Specimen: BLOOD  Result Value Ref Range Status   Specimen Description   Final    BLOOD BLOOD RIGHT FOREARM Performed at [facility] [address], [place], [place] [address]    Special Requests   Final    BOTTLES DRAWN AEROBIC AND ANAEROBIC Blood Culture adequate volume Performed at [facility] [address] [place], [place] [address]    Culture   Final    NO GROWTH < 12 HOURS Performed at [facility] [address], [place], [place] [address]    Report Status PENDING  Incomplete  Culture, blood (routine x 2)     Status: None (Preliminary result)   Collection Time: 10/10/21  4:15 AM   Specimen: BLOOD  Result Value Ref Range Status   Specimen Description   Final    BLOOD LEFT ANTECUBITAL Performed at [facility] [address] [place], [place] [address]    Special Requests   Final    BOTTLES DRAWN AEROBIC ONLY Blood Culture results may not be optimal due to an inadequate volume of blood received in  culture bottles Performed at [facility] [address] [place], [place] [address]    Culture   Final    NO GROWTH < 12 HOURS Performed at [facility] [address] [place],  [address]    Report Status PENDING  Incomplete         Radiology Studies: CT ABDOMEN PELVIS WO CONTRAST  Result Date: 10/10/2021 CLINICAL DATA:  Mesenteric ischemia, acute; abdominal pain all over with rectal bleeding EXAM: CT ABDOMEN AND PELVIS WITHOUT CONTRAST TECHNIQUE: Multidetector CT imaging of the abdomen and pelvis was performed following the standard protocol without IV contrast. RADIATION DOSE REDUCTION: This exam was performed according to the departmental dose-optimization program which includes  automated exposure control, adjustment of the mA and/or kV according to patient size and/or use of iterative reconstruction technique. COMPARISON:  May 10, 2014 FINDINGS: Lower chest: There is a minor atelectasis versus pleuropulmonary scarring seen at the left lung base. No discrete nodule. Hepatobiliary: No focal liver abnormality is seen. No gallstones, gallbladder wall thickening, or biliary dilatation. Gallbladder is contracted. No portal venous gas. Pancreas: Fatty infiltration of the pancreas. No pancreatic ductal dilatation or surrounding inflammatory changes. Spleen: Normal in size without focal abnormality. Adrenals/Urinary Tract: Adrenal glands are unremarkable. Kidneys are normal, without renal calculi, or focal lesion. Mild prominence of the ureters bilateral greater on the left without filling defect. Bilateral renal vascular calcifications. Previously seen perinephric stranding has resolved. Bladder is unremarkable. Stomach/Bowel: Small hiatal hernia. Stomach is within normal limits. Appendix appears normal. There is diffuse moderately severe bowel wall thickening of the descending colon seen extending to the rectosigmoid. There is moderate pericolonic inflammatory  stranding seen. No evidence of pneumatosis intestinalis. Vascular/Lymphatic: There are extensive atheromatous calcifications of the abdominal aorta seen extending to the iliac arteries Reproductive: Prostate is unremarkable. Other: There is small fluid seen at the dependent pelvis greater on the left (image 70/2) Musculoskeletal: Moderate lumbar spondylosis with grade 1 anterolisthesis at L3-L4 and L4-L5 with vacuum disc phenomenon at L3-L4 through L5-S1. IMPRESSION: 1. Moderate to severe diffuse edema/thickening of the descending colonic wall extending to the rectosigmoid with moderate pericolonic inflammatory stranding. Small fluid in the dependent pelvis at the left side. Tiny fluid at the left upper paracolic gutter (image 78/4). The differential considerations are ischemic colitis, inflammatory colitis and less likely colonic diverticulitis. No pneumatosis intestinalis or portal venous gas. 2. Extensive atheromatous calcifications of the abdominal aorta extending into the iliac arteries. 3.  Smaller hiatal hernia. Electronically Signed   By: Frazier Richards M.D.   On: 10/10/2021 07:59        Scheduled Meds:  fluticasone furoate-vilanterol  1 puff Inhalation Daily   And   umeclidinium bromide  1 puff Inhalation Daily   metoprolol tartrate  5 mg Intravenous Q8H   pantoprazole (PROTONIX) IV  40 mg Intravenous Q24H   Continuous Infusions:  lactated ringers     [START ON 10/12/2021] levofloxacin (LEVAQUIN) IV     metronidazole 500 mg (10/11/21 0307)     LOS: 0 days    Time spent: 45 minutes spent on chart review, discussion with nursing staff, consultants, updating family and interview/physical exam; more than 50% of that time was spent in counseling and/or coordination of care.    Geradine Girt, DO [facility] Available via Epic secure chat 7am-7pm After these hours, please refer to coverage provider listed on [email or web address] 10/11/2021, 9:43 AM

## 2021-10-11 NOTE — Progress Notes (Signed)
PHARMACY NOTE:  ANTIMICROBIAL RENAL DOSAGE ADJUSTMENT  Current antimicrobial regimen includes a mismatch between antimicrobial dosage and estimated renal function.  As per policy approved by the Pharmacy & Therapeutics and Medical Executive Committees, the antimicrobial dosage will be adjusted accordingly.  Current antimicrobial dosage:  levaquin '750mg'$  q48h  Indication: intra-abdominal infection  Renal Function:  Estimated Creatinine Clearance: 36.3 mL/min (A) (by C-G formula based on SCr of 1.3 mg/dL (H)). '[]'$      On intermittent HD, scheduled: '[]'$      On CRRT    Antimicrobial dosage has been changed to:  '500mg'$  q24h  Additional comments:   Thank you for allowing pharmacy to be a part of this patient's care.  Peggyann Juba, PharmD, BCPS Pharmacy: [phone] 10/11/2021 1:10 PM

## 2021-10-12 DIAGNOSIS — K529 Noninfective gastroenteritis and colitis, unspecified: Secondary | ICD-10-CM | POA: Diagnosis not present

## 2021-10-12 LAB — CBC
HCT: 34.6 % — ABNORMAL LOW (ref 36.0–46.0)
Hemoglobin: 11.2 g/dL — ABNORMAL LOW (ref 12.0–15.0)
MCH: 28.4 pg (ref 26.0–34.0)
MCHC: 32.4 g/dL (ref 30.0–36.0)
MCV: 87.6 fL (ref 80.0–100.0)
Platelets: 162 10*3/uL (ref 150–400)
RBC: 3.95 MIL/uL (ref 3.87–5.11)
RDW: 15.1 % (ref 11.5–15.5)
WBC: 13 10*3/uL — ABNORMAL HIGH (ref 4.0–10.5)
nRBC: 0 % (ref 0.0–0.2)

## 2021-10-12 LAB — BASIC METABOLIC PANEL
Anion gap: 10 (ref 5–15)
BUN: 11 mg/dL (ref 8–23)
CO2: 22 mmol/L (ref 22–32)
Calcium: 9.2 mg/dL (ref 8.9–10.3)
Chloride: 108 mmol/L (ref 98–111)
Creatinine, Ser: 1.13 mg/dL — ABNORMAL HIGH (ref 0.44–1.00)
GFR, Estimated: 50 mL/min — ABNORMAL LOW (ref >60)
Glucose, Bld: 95 mg/dL (ref 70–99)
Potassium: 3.5 mmol/L (ref 3.5–5.1)
Sodium: 140 mmol/L (ref 135–145)

## 2021-10-12 LAB — GLUCOSE, CAPILLARY: Glucose-Capillary: 90 mg/dL (ref 70–99)

## 2021-10-12 MED ORDER — METOPROLOL TARTRATE 5 MG/5ML IV SOLN
5.0000 mg | Freq: Four times a day (QID) | INTRAVENOUS | Status: DC | PRN
  Administered 2021-10-14: 5 mg via INTRAVENOUS
  Filled 2021-10-12: qty 5

## 2021-10-12 MED ORDER — FLUTICASONE PROPIONATE 50 MCG/ACT NA SUSP
2.0000 | Freq: Every day | NASAL | Status: DC
  Administered 2021-10-13 – 2021-10-15 (×3): 2 via NASAL
  Filled 2021-10-12: qty 16

## 2021-10-12 MED ORDER — AMLODIPINE BESYLATE 10 MG PO TABS
10.0000 mg | ORAL_TABLET | Freq: Every day | ORAL | Status: DC
  Administered 2021-10-12 – 2021-10-15 (×4): 10 mg via ORAL
  Filled 2021-10-12 (×4): qty 1

## 2021-10-12 MED ORDER — MONTELUKAST SODIUM 10 MG PO TABS
10.0000 mg | ORAL_TABLET | Freq: Every day | ORAL | Status: DC
  Administered 2021-10-12 – 2021-10-14 (×3): 10 mg via ORAL
  Filled 2021-10-12 (×3): qty 1

## 2021-10-12 MED ORDER — MELATONIN 3 MG PO TABS
3.0000 mg | ORAL_TABLET | Freq: Every day | ORAL | Status: AC
  Administered 2021-10-12 – 2021-10-13 (×2): 3 mg via ORAL
  Filled 2021-10-12 (×2): qty 1

## 2021-10-12 MED ORDER — METOPROLOL SUCCINATE ER 50 MG PO TB24
50.0000 mg | ORAL_TABLET | Freq: Every day | ORAL | Status: DC
  Administered 2021-10-12 – 2021-10-13 (×2): 50 mg via ORAL
  Filled 2021-10-12 (×2): qty 1

## 2021-10-12 NOTE — Progress Notes (Signed)
Subjective/Chief Complaint: Stable over night.  Has had some blood in her BM's Abdominal pain less   Objective: Vital signs in last 24 hours: Temp:  [98.6 F (37 C)-99.1 F (37.3 C)] 98.6 F (37 C) (07/19 0624) Pulse Rate:  [95-100] 95 (07/18 2018) Resp:  [14-18] 18 (07/18 2018) BP: (160-189)/(60-73) 176/68 (07/18 2018) SpO2:  [92 %-95 %] 93 % (07/18 2018) Last BM Date : 10/09/21  Intake/Output from previous day: 07/18 0701 - 07/19 0700 In: 1931.2 [P.O.:680; I.V.:951.2; IV Piggyback:300] Out: 2300 [Urine:2300] Intake/Output this shift: No intake/output data recorded.  Exam: Awake and alert Much more comfortable in appearance Abdomen soft, still tender with guarding on the left upper and lower abdomen but much less since admission  Lab Results:  Recent Labs    10/11/21 0517 10/12/21 0509  WBC 15.6* 13.0*  HGB 11.1* 11.2*  HCT 34.4* 34.6*  PLT 142* 162   BMET Recent Labs    10/11/21 0517 10/12/21 0509  NA 138 140  K 3.6 3.5  CL 109 108  CO2 22 22  GLUCOSE 102* 95  BUN 20 11  CREATININE 1.30* 1.13*  CALCIUM 8.9 9.2   PT/INR No results for input(s): "LABPROT", "INR" in the last 72 hours. ABG No results for input(s): "PHART", "HCO3" in the last 72 hours.  Invalid input(s): "PCO2", "PO2"  Studies/Results: CT ABDOMEN PELVIS WO CONTRAST  Result Date: 10/10/2021 CLINICAL DATA:  Mesenteric ischemia, acute; abdominal pain all over with rectal bleeding EXAM: CT ABDOMEN AND PELVIS WITHOUT CONTRAST TECHNIQUE: Multidetector CT imaging of the abdomen and pelvis was performed following the standard protocol without IV contrast. RADIATION DOSE REDUCTION: This exam was performed according to the departmental dose-optimization program which includes automated exposure control, adjustment of the mA and/or kV according to patient size and/or use of iterative reconstruction technique. COMPARISON:  May 10, 2014 FINDINGS: Lower chest: There is a minor atelectasis  versus pleuropulmonary scarring seen at the left lung base. No discrete nodule. Hepatobiliary: No focal liver abnormality is seen. No gallstones, gallbladder wall thickening, or biliary dilatation. Gallbladder is contracted. No portal venous gas. Pancreas: Fatty infiltration of the pancreas. No pancreatic ductal dilatation or surrounding inflammatory changes. Spleen: Normal in size without focal abnormality. Adrenals/Urinary Tract: Adrenal glands are unremarkable. Kidneys are normal, without renal calculi, or focal lesion. Mild prominence of the ureters bilateral greater on the left without filling defect. Bilateral renal vascular calcifications. Previously seen perinephric stranding has resolved. Bladder is unremarkable. Stomach/Bowel: Small hiatal hernia. Stomach is within normal limits. Appendix appears normal. There is diffuse moderately severe bowel wall thickening of the descending colon seen extending to the rectosigmoid. There is moderate pericolonic inflammatory stranding seen. No evidence of pneumatosis intestinalis. Vascular/Lymphatic: There are extensive atheromatous calcifications of the abdominal aorta seen extending to the iliac arteries Reproductive: Prostate is unremarkable. Other: There is small fluid seen at the dependent pelvis greater on the left (image 70/2) Musculoskeletal: Moderate lumbar spondylosis with grade 1 anterolisthesis at L3-L4 and L4-L5 with vacuum disc phenomenon at L3-L4 through L5-S1. IMPRESSION: 1. Moderate to severe diffuse edema/thickening of the descending colonic wall extending to the rectosigmoid with moderate pericolonic inflammatory stranding. Small fluid in the dependent pelvis at the left side. Tiny fluid at the left upper paracolic gutter (image 72/5). The differential considerations are ischemic colitis, inflammatory colitis and less likely colonic diverticulitis. No pneumatosis intestinalis or portal venous gas. 2. Extensive atheromatous calcifications of the  abdominal aorta extending into the iliac arteries. 3.  Smaller hiatal  hernia. Electronically Signed   By: Frazier Richards M.D.   On: 10/10/2021 07:59    Anti-infectives: Anti-infectives (From admission, onward)    Start     Dose/Rate Route Frequency Ordered Stop   10/12/21 0600  levofloxacin (LEVAQUIN) IVPB 500 mg  Status:  Discontinued        500 mg 100 mL/hr over 60 Minutes Intravenous Every 48 hours 10/10/21 2105 10/11/21 1309   10/11/21 1400  levofloxacin (LEVAQUIN) IVPB 500 mg        500 mg 100 mL/hr over 60 Minutes Intravenous Every 24 hours 10/11/21 1309     10/11/21 0600  levofloxacin (LEVAQUIN) IVPB 500 mg  Status:  Discontinued        500 mg 100 mL/hr over 60 Minutes Intravenous Every 24 hours 10/10/21 1351 10/10/21 2105   10/10/21 1600  metroNIDAZOLE (FLAGYL) IVPB 500 mg        500 mg 100 mL/hr over 60 Minutes Intravenous Every 12 hours 10/10/21 1351     10/10/21 0400  levofloxacin (LEVAQUIN) IVPB 750 mg        750 mg 100 mL/hr over 90 Minutes Intravenous  Once 10/10/21 0349 10/10/21 0727   10/10/21 0400  metroNIDAZOLE (FLAGYL) IVPB 500 mg        500 mg 100 mL/hr over 60 Minutes Intravenous  Once 10/10/21 0349 10/10/21 0545       Assessment/Plan: Colitis of uncertain etiology  Clinically she is improving so will continue conservative management with antibiotics WBC continuing to trend down. Creatinine better Will advance to full liquid diet  Moderately complex medical decision making  Coralie Keens MD 10/12/2021

## 2021-10-12 NOTE — Progress Notes (Signed)
PROGRESS NOTE    Joanne Holmes  JSH:[id number] DOB: [birth year] DOA: 10/10/2021 PCP: Caren Macadam, MD    Brief Narrative:  Joanne Holmes is a 76 y.o. female with medical history significant of seasonal allergies, asthma,/COPD, hypertension, nephrolithiasis, left-sided carpal tunnel, history of pneumonia, lumbar spinal stenosis, postlaminectomy syndrome, class I obesity who presented to the emergency department due to abdominal pain associated with nausea, vomiting and diarrhea with most recent loose stools being bloody after she ate lasagna.  Found to have colitis.  Conservative treatment for now.   Assessment and Plan:  Acute colitis- unclear whether ischemic or infectious Seen by general surgery, appear improving on IV antibiotics, wbc coming down from 21.9 to 13 continue conservative management, diet per general surgery    AKI (acute kidney injury) ([place]) Creatinine coming down from 2.26-1.13 today Hold ARB/ACE. Hold diuretic. Avoid hypotension. Avoid nephrotoxins. Monitor intake and output. Monitor renal function electrolytes.    Essential hypertension With Hypertensive urgency Was on Metoprolol 5 mg IVP TID initially due to npo, now she is started on full liquid diet, will resume norvasc and metoprolol,  continue hold avapro due to elevated cr, may be able to resume tomorrow if creatinine continues to improve Hydralazine 10 mg every 4 hours as needed.    Hyperglycemia Fasting Blood glucose 212 on presentation Likely due to stress, no prior history of diabetes Blood glucose 95 this morning Monitor     COPD (chronic obstructive pulmonary disease) (HCC) Not on home O2 No signs of decompensation at this time. Continue Trelegy Ellipta. Nebulized SABA as needed.    Class 1 obesity Estimated body mass index is 34.75 kg/m as calculated from the following:   Height as of this encounter: '5\' 2"'$  (1.575 m).   Weight as of this encounter: 86.2 kg.                DVT prophylaxis: SCDs Start: 10/10/21 0943    Code Status: Full Code Family Communication: Husband at bedside  Disposition Plan:  Level of care: Telemetry The patient will require care on IV abx , surgery following     Consultants:  GS   Subjective: Still with pain in LLQ, but improved, denies n/v Reports allergic rhinitis, wants home meds allegra and flonase Bp elevated Husband at bedside   Objective: Vitals:   10/11/21 1245 10/11/21 2018 10/12/21 0624 10/12/21 0747  BP: (!) 160/73 (!) 176/68    Pulse: 100 95    Resp: 18 18    Temp: 99.1 F (37.3 C) 99 F (37.2 C) 98.6 F (37 C)   TempSrc: Oral Oral Oral   SpO2: 92% 93%  95%  Weight:      Height:        Intake/Output Summary (Last 24 hours) at 10/12/2021 1145 Last data filed at 10/12/2021 0630 Gross per 24 hour  Intake 1931.18 ml  Output 2300 ml  Net -368.82 ml   Filed Weights   10/10/21 1014  Weight: 86.2 kg    Examination:   General: Appearance:    Obese female who appears uncomfortable     Lungs:     respirations unlabored  Heart:    Normal heart rate. Normal rhythm. No murmurs, rubs, or gallops.    MS:   All extremities are intact.    Neurologic:   Awake, alert, oriented x 3. No apparent focal neurological           defect.        Data Reviewed: I  have personally reviewed following labs and imaging studies  CBC: Recent Labs  Lab 10/10/21 0335 10/11/21 0517 10/12/21 0509  WBC 21.9* 15.6* 13.0*  NEUTROABS 19.3*  --   --   HGB 14.5 11.1* 11.2*  HCT 43.2 34.4* 34.6*  MCV 86.2 88.2 87.6  PLT 257 142* 539   Basic Metabolic Panel: Recent Labs  Lab 10/10/21 0335 10/11/21 0517 10/12/21 0509  NA 141 138 140  K 4.0 3.6 3.5  CL 109 109 108  CO2 19* 22 22  GLUCOSE 212* 102* 95  BUN 47* 20 11  CREATININE 2.26* 1.30* 1.13*  CALCIUM 10.0 8.9 9.2   GFR: Estimated Creatinine Clearance: 41.8 mL/min (A) (by C-G formula based on SCr of 1.13 mg/dL (H)). Liver Function Tests: Recent Labs   Lab 10/10/21 0335 10/11/21 0517  AST 27 17  ALT 23 15  ALKPHOS 106 63  BILITOT 0.3 0.7  PROT 7.6 5.4*  ALBUMIN 4.3 2.9*   Recent Labs  Lab 10/10/21 0335  LIPASE 31   No results for input(s): "AMMONIA" in the last 168 hours. Coagulation Profile: No results for input(s): "INR", "PROTIME" in the last 168 hours. Cardiac Enzymes: No results for input(s): "CKTOTAL", "CKMB", "CKMBINDEX", "TROPONINI" in the last 168 hours. BNP (last 3 results) No results for input(s): "PROBNP" in the last 8760 hours. HbA1C: No results for input(s): "HGBA1C" in the last 72 hours. CBG: Recent Labs  Lab 10/11/21 0052 10/11/21 0757 10/11/21 1723  GLUCAP 105* 99 114*   Lipid Profile: No results for input(s): "CHOL", "HDL", "LDLCALC", "TRIG", "CHOLHDL", "LDLDIRECT" in the last 72 hours. Thyroid Function Tests: No results for input(s): "TSH", "T4TOTAL", "FREET4", "T3FREE", "THYROIDAB" in the last 72 hours. Anemia Panel: No results for input(s): "VITAMINB12", "FOLATE", "FERRITIN", "TIBC", "IRON", "RETICCTPCT" in the last 72 hours. Sepsis Labs: Recent Labs  Lab 10/10/21 0535 10/10/21 1251 10/11/21 0750 10/11/21 1010  LATICACIDVEN 2.6* 2.6* 0.9 0.7    Recent Results (from the past 240 hour(s))  Culture, blood (routine x 2)     Status: None (Preliminary result)   Collection Time: 10/10/21  3:35 AM   Specimen: BLOOD  Result Value Ref Range Status   Specimen Description   Final    BLOOD BLOOD RIGHT FOREARM Performed at [facility] [address], [place], [place] [address]    Special Requests   Final    BOTTLES DRAWN AEROBIC AND ANAEROBIC Blood Culture adequate volume Performed at [facility] [address], [place], [place] [address]    Culture   Final    NO GROWTH 2 DAYS Performed at [facility] [address], [place], [place] [address]    Report Status PENDING  Incomplete  Culture, blood (routine x 2)     Status: None (Preliminary  result)   Collection Time: 10/10/21  4:15 AM   Specimen: BLOOD  Result Value Ref Range Status   Specimen Description   Final    BLOOD LEFT ANTECUBITAL Performed at [facility] [address], [place], [place] [address]    Special Requests   Final    BOTTLES DRAWN AEROBIC ONLY Blood Culture results may not be optimal due to an inadequate volume of blood received in culture bottles Performed at [facility] [address], [place], [place] [address]    Culture   Final    NO GROWTH 2 DAYS Performed at [facility] [address] [place], [place] [address]  Report Status PENDING  Incomplete         Radiology Studies: No results found.      Scheduled Meds:  amLODipine  10 mg Oral Daily   fluticasone  2 spray Each Nare Daily   fluticasone furoate-vilanterol  1 puff Inhalation Daily   And   umeclidinium bromide  1 puff Inhalation Daily   metoprolol succinate  50 mg Oral QHS   montelukast  10 mg Oral QHS   pantoprazole (PROTONIX) IV  40 mg Intravenous Q24H   Continuous Infusions:  levofloxacin (LEVAQUIN) IV 500 mg (10/11/21 1448)   metronidazole 500 mg (10/12/21 0348)     LOS: 1 day    Time spent: 45 minutes spent on chart review, discussion with nursing staff, consultants, updating family and interview/physical exam; more than 50% of that time was spent in counseling and/or coordination of care.    Florencia Reasons, MD PhD FACP [facility] Available via Epic secure chat 7am-7pm After these hours, please refer to coverage provider listed on [email or web address] 10/12/2021, 11:45 AM

## 2021-10-13 DIAGNOSIS — K529 Noninfective gastroenteritis and colitis, unspecified: Secondary | ICD-10-CM | POA: Diagnosis not present

## 2021-10-13 LAB — URINALYSIS, ROUTINE W REFLEX MICROSCOPIC
Bacteria, UA: NONE SEEN
Bilirubin Urine: NEGATIVE
Glucose, UA: NEGATIVE mg/dL
Ketones, ur: 5 mg/dL — AB
Nitrite: NEGATIVE
Protein, ur: 30 mg/dL — AB
Specific Gravity, Urine: 1.006 (ref 1.005–1.030)
pH: 7 (ref 5.0–8.0)

## 2021-10-13 LAB — CBC WITH DIFFERENTIAL/PLATELET
Abs Immature Granulocytes: 0.1 10*3/uL — ABNORMAL HIGH (ref 0.00–0.07)
Basophils Absolute: 0 10*3/uL (ref 0.0–0.1)
Basophils Relative: 0 %
Eosinophils Absolute: 0.1 10*3/uL (ref 0.0–0.5)
Eosinophils Relative: 1 %
HCT: 33 % — ABNORMAL LOW (ref 36.0–46.0)
Hemoglobin: 10.8 g/dL — ABNORMAL LOW (ref 12.0–15.0)
Immature Granulocytes: 1 %
Lymphocytes Relative: 11 %
Lymphs Abs: 1.4 10*3/uL (ref 0.7–4.0)
MCH: 28.2 pg (ref 26.0–34.0)
MCHC: 32.7 g/dL (ref 30.0–36.0)
MCV: 86.2 fL (ref 80.0–100.0)
Monocytes Absolute: 0.9 10*3/uL (ref 0.1–1.0)
Monocytes Relative: 7 %
Neutro Abs: 9.6 10*3/uL — ABNORMAL HIGH (ref 1.7–7.7)
Neutrophils Relative %: 80 %
Platelets: 177 10*3/uL (ref 150–400)
RBC: 3.83 MIL/uL — ABNORMAL LOW (ref 3.87–5.11)
RDW: 14.9 % (ref 11.5–15.5)
WBC: 12.1 10*3/uL — ABNORMAL HIGH (ref 4.0–10.5)
nRBC: 0 % (ref 0.0–0.2)

## 2021-10-13 LAB — GLUCOSE, CAPILLARY
Glucose-Capillary: 117 mg/dL — ABNORMAL HIGH (ref 70–99)
Glucose-Capillary: 113 mg/dL — ABNORMAL HIGH (ref 70–99)
Glucose-Capillary: 102 mg/dL — ABNORMAL HIGH (ref 70–99)

## 2021-10-13 LAB — BASIC METABOLIC PANEL
Anion gap: 9 (ref 5–15)
BUN: 12 mg/dL (ref 8–23)
CO2: 21 mmol/L — ABNORMAL LOW (ref 22–32)
Calcium: 8.7 mg/dL — ABNORMAL LOW (ref 8.9–10.3)
Chloride: 107 mmol/L (ref 98–111)
Creatinine, Ser: 1.24 mg/dL — ABNORMAL HIGH (ref 0.44–1.00)
GFR, Estimated: 45 mL/min — ABNORMAL LOW (ref >60)
Glucose, Bld: 103 mg/dL — ABNORMAL HIGH (ref 70–99)
Potassium: 3 mmol/L — ABNORMAL LOW (ref 3.5–5.1)
Sodium: 137 mmol/L (ref 135–145)

## 2021-10-13 LAB — LACTIC ACID, PLASMA: Lactic Acid, Venous: 0.8 mmol/L (ref 0.5–1.9)

## 2021-10-13 LAB — MAGNESIUM: Magnesium: 1.5 mg/dL — ABNORMAL LOW (ref 1.7–2.4)

## 2021-10-13 LAB — TSH: TSH: 0.701 u[IU]/mL (ref 0.350–4.500)

## 2021-10-13 MED ORDER — LEVOFLOXACIN 500 MG PO TABS
500.0000 mg | ORAL_TABLET | Freq: Every day | ORAL | Status: DC
Start: 2021-10-13 — End: 2021-10-15
  Administered 2021-10-13 – 2021-10-15 (×3): 500 mg via ORAL
  Filled 2021-10-13 (×3): qty 1

## 2021-10-13 MED ORDER — METRONIDAZOLE 500 MG PO TABS
500.0000 mg | ORAL_TABLET | Freq: Three times a day (TID) | ORAL | Status: DC
  Administered 2021-10-13 – 2021-10-15 (×6): 500 mg via ORAL
  Filled 2021-10-13 (×6): qty 1

## 2021-10-13 MED ORDER — PANTOPRAZOLE SODIUM 40 MG PO TBEC
40.0000 mg | DELAYED_RELEASE_TABLET | Freq: Every day | ORAL | Status: DC
  Administered 2021-10-14 – 2021-10-15 (×2): 40 mg via ORAL
  Filled 2021-10-13 (×2): qty 1

## 2021-10-13 MED ORDER — MAGNESIUM SULFATE 2 GM/50ML IV SOLN
2.0000 g | Freq: Once | INTRAVENOUS | Status: AC
  Administered 2021-10-13: 2 g via INTRAVENOUS
  Filled 2021-10-13: qty 50

## 2021-10-13 MED ORDER — POTASSIUM CHLORIDE 20 MEQ PO PACK
40.0000 meq | PACK | ORAL | Status: AC
  Administered 2021-10-13 (×2): 40 meq via ORAL
  Filled 2021-10-13 (×2): qty 2

## 2021-10-13 NOTE — Progress Notes (Signed)
   10/13/21 1400  Mobility  Activity Ambulated with assistance in room;Ambulated with assistance to bathroom;Ambulated with assistance in hallway  Range of Motion/Exercises Active;All extremities  Level of Assistance Minimal assist, patient does 75% or more  Assistive Device None  Distance Ambulated (ft) 360 ft  Activity Response Tolerated well   Pt ambulated well. Contact guard assist due to history of back pain. Pt left with necessities in reach & lunch.  [facility]

## 2021-10-13 NOTE — Progress Notes (Signed)
PHARMACIST - PHYSICIAN COMMUNICATION  DR:   Erlinda Hong  CONCERNING: IV to Oral Route Change Policy  RECOMMENDATION: This patient is receiving protonix/levaquin by the intravenous route.  Based on criteria approved by the Pharmacy and Therapeutics Committee, the intravenous medication(s) is/are being converted to the equivalent oral dose form(s).   DESCRIPTION: These criteria include: The patient is eating (either orally or via tube) and/or has been taking other orally administered medications for a least 24 hours The patient has no evidence of active gastrointestinal bleeding or impaired GI absorption (gastrectomy, short bowel, patient on TNA or NPO).  If you have questions about this conversion, please contact the Pharmacy Department  '[]'$   [phone] )  Joanne Holmes '[]'$   [phone] )  Joanne Holmes '[]'$   [phone] )  Joanne Holmes '[]'$   [phone] )  [facility] '[x]'$   [phone] )  [place], PharmD, BCPS 10/13/2021 12:52 PM

## 2021-10-13 NOTE — Progress Notes (Signed)
PT Cancellation Note  Patient Details Name: Joanne Holmes MRN: [id number] DOB: [birth year]   Cancelled Treatment:    Reason Eval/Treat Not Completed: PT screened, no needs identified, will sign off Pt ambulated with mobility specialist.  Pt reports having stair lift at home as well as SPC and RW if needed however typically ambulatory without assistive device.  Pt states she has chronic back pain from spinal stenosis however aware this is degenerative in nature and prefers not to have surgery.  Pt aware she can try OPPT to assist with symptoms management.  Pt agreeable to continue mobilizing with staff.  PT to sign off at this time.   Myrtis Hopping [place] 10/13/2021, 2:32 PM Jannette Spanner PT, DPT Physical Therapist Acute Rehabilitation Services Preferred contact method: Secure Chat Weekend Pager Only: [phone] Office: [phone]

## 2021-10-13 NOTE — Progress Notes (Signed)
   Subjective/Chief Complaint: Reports having no abdominal pain now Having loose BM's   Objective: Vital signs in last 24 hours: Temp:  [98.6 F (37 C)-99 F (37.2 C)] 99 F (37.2 C) (07/20 0619) Pulse Rate:  [86-122] 86 (07/20 0619) Resp:  [18] 18 (07/20 0619) BP: (155-187)/(60-95) 159/60 (07/20 0619) SpO2:  [94 %-99 %] 95 % (07/20 0619) Last BM Date : 10/09/21  Intake/Output from previous day: 07/19 0701 - 07/20 0700 In: 784.7 [P.O.:480; IV Piggyback:304.7] Out: 1100 [Urine:1100] Intake/Output this shift: No intake/output data recorded.  Exam: Awake and alert Abdominal exam significantly improved with minimal tenderness and guarding in the LUQ,LLQ  Lab Results:  Recent Labs    10/12/21 0509 10/13/21 0506  WBC 13.0* 12.1*  HGB 11.2* 10.8*  HCT 34.6* 33.0*  PLT 162 177   BMET Recent Labs    10/12/21 0509 10/13/21 0506  NA 140 137  K 3.5 3.0*  CL 108 107  CO2 22 21*  GLUCOSE 95 103*  BUN 11 12  CREATININE 1.13* 1.24*  CALCIUM 9.2 8.7*   PT/INR No results for input(s): "LABPROT", "INR" in the last 72 hours. ABG No results for input(s): "PHART", "HCO3" in the last 72 hours.  Invalid input(s): "PCO2", "PO2"  Studies/Results: No results found.  Anti-infectives: Anti-infectives (From admission, onward)    Start     Dose/Rate Route Frequency Ordered Stop   10/12/21 0600  levofloxacin (LEVAQUIN) IVPB 500 mg  Status:  Discontinued        500 mg 100 mL/hr over 60 Minutes Intravenous Every 48 hours 10/10/21 2105 10/11/21 1309   10/11/21 1400  levofloxacin (LEVAQUIN) IVPB 500 mg        500 mg 100 mL/hr over 60 Minutes Intravenous Every 24 hours 10/11/21 1309     10/11/21 0600  levofloxacin (LEVAQUIN) IVPB 500 mg  Status:  Discontinued        500 mg 100 mL/hr over 60 Minutes Intravenous Every 24 hours 10/10/21 1351 10/10/21 2105   10/10/21 1600  metroNIDAZOLE (FLAGYL) IVPB 500 mg        500 mg 100 mL/hr over 60 Minutes Intravenous Every 12 hours  10/10/21 1351     10/10/21 0400  levofloxacin (LEVAQUIN) IVPB 750 mg        750 mg 100 mL/hr over 90 Minutes Intravenous  Once 10/10/21 0349 10/10/21 0727   10/10/21 0400  metroNIDAZOLE (FLAGYL) IVPB 500 mg        500 mg 100 mL/hr over 60 Minutes Intravenous  Once 10/10/21 0349 10/10/21 0545       Assessment/Plan: Colitis  Clinically she continues significant improvement.  This does lean more towards infectious than ischemic colitis No plans for surgery or CT scan unless she clinically changes  Will try a soft diet today    Joanne Holmes 10/13/2021

## 2021-10-13 NOTE — Plan of Care (Signed)
  Problem: Health Behavior/Discharge Planning: Goal: Ability to manage health-related needs will improve Outcome: Progressing   Problem: Activity: Goal: Risk for activity intolerance will decrease Outcome: Progressing   Problem: Pain Managment: Goal: General experience of comfort will improve Outcome: Progressing   

## 2021-10-13 NOTE — Progress Notes (Addendum)
PROGRESS NOTE    DAVINIA RICCARDI  EPP:[id number] DOB: Mar 20, 1946 DOA: 10/10/2021 PCP: Caren Macadam, MD    Brief Narrative:  Joanne Holmes is a 76 y.o. female with medical history significant of seasonal allergies, asthma,/COPD, hypertension, nephrolithiasis, left-sided carpal tunnel, history of pneumonia, lumbar spinal stenosis, postlaminectomy syndrome, class I obesity who presented to the emergency department due to abdominal pain associated with nausea, vomiting and diarrhea with most recent loose stools being bloody after she ate lasagna.  Found to have colitis.  Conservative treatment for now.   Assessment and Plan:  Acute colitis- unclear whether ischemic or infectious Seen by general surgery, appear improving on IV antibiotics, wbc coming down from 21.9 to 13, diet advanced by gen surg, no plan for surgery, change abx to oral Appreciate general surgery input    AKI (acute kidney injury) ([place]) on CKD IIIa -CT ab on presentation kidney unremarkable, but did show "Kidneys are normal, without renal calculi, or focal lesion. Mild prominence of the ureters bilateral greater on the left without filling defect. Bilateral renal vascular calcifications" -will get bladder scan to measure PVR, addendum, per RN bladder scan did not show any urine retaining  -patient reports her pcp recently referred her to a nephrologist Creatinine coming down from 2.26-1.13 -1.24  Hold ARB/ACE. Hold diuretic. Avoid hypotension. Avoid nephrotoxins. Monitor intake and output. Monitor renal function electrolytes.    Essential hypertension With Hypertensive urgency Was on Metoprolol 5 mg IVP TID initially due to npo,  now back to  oral meds norvasc and metoprolol,  continue hold avapro due to elevated cr, may be able to resume tomorrow if creatinine continues to improve Hydralazine 10 mg every 4 hours as needed.    Hyperglycemia Fasting Blood glucose 212 on presentation Likely due to stress, no  prior history of diabetes Blood glucose 95 this morning Monitor   Hypokalemia/hypomagnesemia Replace    COPD (chronic obstructive pulmonary disease) (HCC) Not on home O2 No signs of decompensation at this time. Continue Trelegy Ellipta. Nebulized SABA as needed.    Class 1 obesity Estimated body mass index is 34.75 kg/m as calculated from the following:   Height as of this encounter: '5\' 2"'$  (1.575 m).   Weight as of this encounter: 86.2 kg.        h/o spinal stenosis, will have Pt see her       DVT prophylaxis: SCDs Start: 10/10/21 0943    Code Status: Full Code Family Communication: Husband at bedside  Disposition Plan:  Level of care: Telemetry Possible discharge in 24-48hrs, need gen surg clearance    Consultants:  GS   Subjective:   Brown bm documented , reports started to have diarrhea, ab pain has much improved, tolerating diet advancement, no n/v Wbc improving, cr fluctuating  Low K/mag   Husband at bedside   Objective: Vitals:   10/12/21 1833 10/12/21 2103 10/12/21 2200 10/13/21 0619  BP: (!) 157/77 (!) 163/73 (!) 155/72 (!) 159/60  Pulse: (!) 120 (!) 122 (!) 101 86  Resp:  '18 18 18  '$ Temp:  98.6 F (37 C) 98.6 F (37 C) 99 F (37.2 C)  TempSrc:  Oral Oral Oral  SpO2:  95% 94% 95%  Weight:      Height:        Intake/Output Summary (Last 24 hours) at 10/13/2021 0743 Last data filed at 10/13/2021 0600 Gross per 24 hour  Intake 784.65 ml  Output 1100 ml  Net -315.35 ml   [employer]  10/10/21 1014  Weight: 86.2 kg    Examination:   General: Appearance:    Obese female NAD, sitting up in chair, pleasant      Lungs:     respirations unlabored  Heart:    Normal heart rate. Normal rhythm. No murmurs, rubs, or gallops.    MS:   All extremities are intact.    Neurologic:   Awake, alert, oriented x 3. No apparent focal neurological           defect.        Data Reviewed: I have personally reviewed following labs and imaging  studies  CBC: Recent Labs  Lab 10/10/21 0335 10/11/21 0517 10/12/21 0509 10/13/21 0506  WBC 21.9* 15.6* 13.0* 12.1*  NEUTROABS 19.3*  --   --  9.6*  HGB 14.5 11.1* 11.2* 10.8*  HCT 43.2 34.4* 34.6* 33.0*  MCV 86.2 88.2 87.6 86.2  PLT 257 142* 162 825   Basic Metabolic Panel: Recent Labs  Lab 10/10/21 0335 10/11/21 0517 10/12/21 0509 10/13/21 0506  NA 141 138 140 137  K 4.0 3.6 3.5 3.0*  CL 109 109 108 107  CO2 19* 22 22 21*  GLUCOSE 212* 102* 95 103*  BUN 47* '20 11 12  '$ CREATININE 2.26* 1.30* 1.13* 1.24*  CALCIUM 10.0 8.9 9.2 8.7*  MG  --   --   --  1.5*   GFR: Estimated Creatinine Clearance: 38.1 mL/min (A) (by C-G formula based on SCr of 1.24 mg/dL (H)). Liver Function Tests: Recent Labs  Lab 10/10/21 0335 10/11/21 0517  AST 27 17  ALT 23 15  ALKPHOS 106 63  BILITOT 0.3 0.7  PROT 7.6 5.4*  ALBUMIN 4.3 2.9*   Recent Labs  Lab 10/10/21 0335  LIPASE 31   No results for input(s): "AMMONIA" in the last 168 hours. Coagulation Profile: No results for input(s): "INR", "PROTIME" in the last 168 hours. Cardiac Enzymes: No results for input(s): "CKTOTAL", "CKMB", "CKMBINDEX", "TROPONINI" in the last 168 hours. BNP (last 3 results) No results for input(s): "PROBNP" in the last 8760 hours. HbA1C: No results for input(s): "HGBA1C" in the last 72 hours. CBG: Recent Labs  Lab 10/11/21 0757 10/11/21 1723 10/12/21 1649 10/13/21 0226 10/13/21 0722  GLUCAP 99 114* 90 117* 113*   Lipid Profile: No results for input(s): "CHOL", "HDL", "LDLCALC", "TRIG", "CHOLHDL", "LDLDIRECT" in the last 72 hours. Thyroid Function Tests: Recent Labs    10/13/21 0506  TSH 0.701   Anemia Panel: No results for input(s): "VITAMINB12", "FOLATE", "FERRITIN", "TIBC", "IRON", "RETICCTPCT" in the last 72 hours. Sepsis Labs: Recent Labs  Lab 10/10/21 1251 10/11/21 0750 10/11/21 1010 10/13/21 0506  LATICACIDVEN 2.6* 0.9 0.7 0.8    Recent Results (from the past 240 hour(s))   Culture, blood (routine x 2)     Status: None (Preliminary result)   Collection Time: 10/10/21  3:35 AM   Specimen: BLOOD  Result Value Ref Range Status   Specimen Description   Final    BLOOD BLOOD RIGHT FOREARM Performed at [facility] [address], [place], [place] [address]    Special Requests   Final    BOTTLES DRAWN AEROBIC AND ANAEROBIC Blood Culture adequate volume Performed at [facility] [address] [place], [place] [address]    Culture   Final    NO GROWTH 2 DAYS Performed at [facility] [address] [place], [place] [address]    Report Status PENDING  Incomplete  Culture, blood (routine x 2)     Status: None (Preliminary result)   Collection Time: 10/10/21  4:15 AM   Specimen: BLOOD  Result Value Ref Range Status   Specimen Description   Final    BLOOD LEFT ANTECUBITAL Performed at [facility] [address], [place], [place] [address]    Special Requests   Final    BOTTLES DRAWN AEROBIC ONLY Blood Culture results may not be optimal due to an inadequate volume of blood received in culture bottles Performed at [facility] [address], [place], [place] [address]    Culture   Final    NO GROWTH 2 DAYS Performed at [facility] [address], [place], [place] [address]    Report Status PENDING  Incomplete         Radiology Studies: No results found.      Scheduled Meds:  amLODipine  10 mg Oral Daily   fluticasone  2 spray Each Nare Daily   fluticasone furoate-vilanterol  1 puff Inhalation Daily   And   umeclidinium bromide  1 puff Inhalation Daily   melatonin  3 mg Oral QHS   metoprolol succinate  50 mg Oral QHS   montelukast  10 mg Oral QHS   pantoprazole (PROTONIX) IV  40 mg Intravenous Q24H   potassium chloride  40 mEq Oral Q4H   Continuous Infusions:  levofloxacin (LEVAQUIN) IV 500 mg (10/12/21 1343)   magnesium sulfate bolus IVPB      metronidazole 500 mg (10/13/21 0421)     LOS: 2 days       Florencia Reasons, MD PhD FACP [facility] Available via Epic secure chat 7am-7pm After these hours, please refer to coverage provider listed on [email or web address] 10/13/2021, 7:43 AM

## 2021-10-14 DIAGNOSIS — K529 Noninfective gastroenteritis and colitis, unspecified: Secondary | ICD-10-CM | POA: Diagnosis not present

## 2021-10-14 LAB — CBC WITH DIFFERENTIAL/PLATELET
Abs Immature Granulocytes: 0.11 10*3/uL — ABNORMAL HIGH (ref 0.00–0.07)
Basophils Absolute: 0.1 10*3/uL (ref 0.0–0.1)
Basophils Relative: 1 %
Eosinophils Absolute: 0.1 10*3/uL (ref 0.0–0.5)
Eosinophils Relative: 1 %
HCT: 31.7 % — ABNORMAL LOW (ref 36.0–46.0)
Hemoglobin: 10.5 g/dL — ABNORMAL LOW (ref 12.0–15.0)
Immature Granulocytes: 1 %
Lymphocytes Relative: 13 %
Lymphs Abs: 1.2 10*3/uL (ref 0.7–4.0)
MCH: 28.5 pg (ref 26.0–34.0)
MCHC: 33.1 g/dL (ref 30.0–36.0)
MCV: 86.1 fL (ref 80.0–100.0)
Monocytes Absolute: 0.8 10*3/uL (ref 0.1–1.0)
Monocytes Relative: 8 %
Neutro Abs: 7.5 10*3/uL (ref 1.7–7.7)
Neutrophils Relative %: 76 %
Platelets: 198 10*3/uL (ref 150–400)
RBC: 3.68 MIL/uL — ABNORMAL LOW (ref 3.87–5.11)
RDW: 15 % (ref 11.5–15.5)
WBC: 9.8 10*3/uL (ref 4.0–10.5)
nRBC: 0 % (ref 0.0–0.2)

## 2021-10-14 LAB — BASIC METABOLIC PANEL
Anion gap: 9 (ref 5–15)
BUN: 10 mg/dL (ref 8–23)
CO2: 19 mmol/L — ABNORMAL LOW (ref 22–32)
Calcium: 8.9 mg/dL (ref 8.9–10.3)
Chloride: 109 mmol/L (ref 98–111)
Creatinine, Ser: 1.16 mg/dL — ABNORMAL HIGH (ref 0.44–1.00)
GFR, Estimated: 48 mL/min — ABNORMAL LOW (ref >60)
Glucose, Bld: 108 mg/dL — ABNORMAL HIGH (ref 70–99)
Potassium: 3.3 mmol/L — ABNORMAL LOW (ref 3.5–5.1)
Sodium: 137 mmol/L (ref 135–145)

## 2021-10-14 LAB — GASTROINTESTINAL PANEL BY PCR, STOOL (REPLACES STOOL CULTURE)

## 2021-10-14 LAB — GLUCOSE, CAPILLARY
Glucose-Capillary: 101 mg/dL — ABNORMAL HIGH (ref 70–99)
Glucose-Capillary: 106 mg/dL — ABNORMAL HIGH (ref 70–99)
Glucose-Capillary: 114 mg/dL — ABNORMAL HIGH (ref 70–99)

## 2021-10-14 LAB — MAGNESIUM: Magnesium: 1.6 mg/dL — ABNORMAL LOW (ref 1.7–2.4)

## 2021-10-14 MED ORDER — POTASSIUM CHLORIDE 20 MEQ PO PACK
40.0000 meq | PACK | ORAL | Status: AC
  Administered 2021-10-14 (×2): 40 meq via ORAL
  Filled 2021-10-14 (×2): qty 2

## 2021-10-14 MED ORDER — SACCHAROMYCES BOULARDII 250 MG PO CAPS
250.0000 mg | ORAL_CAPSULE | Freq: Two times a day (BID) | ORAL | Status: DC
  Administered 2021-10-14 – 2021-10-15 (×3): 250 mg via ORAL
  Filled 2021-10-14 (×3): qty 1

## 2021-10-14 MED ORDER — METOPROLOL SUCCINATE ER 25 MG PO TB24
75.0000 mg | ORAL_TABLET | Freq: Every day | ORAL | Status: DC
  Administered 2021-10-14: 75 mg via ORAL
  Filled 2021-10-14: qty 1

## 2021-10-14 MED ORDER — SODIUM BICARBONATE 650 MG PO TABS
650.0000 mg | ORAL_TABLET | Freq: Two times a day (BID) | ORAL | Status: DC
  Administered 2021-10-14 – 2021-10-15 (×3): 650 mg via ORAL
  Filled 2021-10-14 (×3): qty 1

## 2021-10-14 MED ORDER — MAGNESIUM SULFATE 2 GM/50ML IV SOLN
2.0000 g | Freq: Once | INTRAVENOUS | Status: AC
  Administered 2021-10-14: 2 g via INTRAVENOUS
  Filled 2021-10-14: qty 50

## 2021-10-14 NOTE — Progress Notes (Signed)
   Subjective/Chief Complaint: Having multiple loose bowel movements Tolerating soft diet Denies any abdominal pain   Objective: Vital signs in last 24 hours: Temp:  [98.3 F (36.8 C)-98.6 F (37 C)] 98.5 F (36.9 C) (07/21 0500) Pulse Rate:  [90-105] 90 (07/21 0500) Resp:  [18-20] 18 (07/21 0500) BP: (166-178)/(59-74) 167/59 (07/21 0500) SpO2:  [94 %-99 %] 94 % (07/21 0500) Last BM Date : 10/13/21  Intake/Output from previous day: 07/20 0701 - 07/21 0700 In: 1000 [P.O.:1000] Out: 1170 [Urine:1170] Intake/Output this shift: No intake/output data recorded.  Exam: Awake and alert Abdomen soft, non-tender, no guarding, no distension  Lab Results:  Recent Labs    10/13/21 0506 10/14/21 0419  WBC 12.1* 9.8  HGB 10.8* 10.5*  HCT 33.0* 31.7*  PLT 177 198   BMET Recent Labs    10/13/21 0506 10/14/21 0419  NA 137 137  K 3.0* 3.3*  CL 107 109  CO2 21* 19*  GLUCOSE 103* 108*  BUN 12 10  CREATININE 1.24* 1.16*  CALCIUM 8.7* 8.9   PT/INR No results for input(s): "LABPROT", "INR" in the last 72 hours. ABG No results for input(s): "PHART", "HCO3" in the last 72 hours.  Invalid input(s): "PCO2", "PO2"  Studies/Results: No results found.  Anti-infectives: Anti-infectives (From admission, onward)    Start     Dose/Rate Route Frequency Ordered Stop   10/13/21 1400  metroNIDAZOLE (FLAGYL) tablet 500 mg        500 mg Oral Every 8 hours 10/13/21 1236     10/13/21 1400  levofloxacin (LEVAQUIN) tablet 500 mg        500 mg Oral Daily 10/13/21 1251     10/12/21 0600  levofloxacin (LEVAQUIN) IVPB 500 mg  Status:  Discontinued        500 mg 100 mL/hr over 60 Minutes Intravenous Every 48 hours 10/10/21 2105 10/11/21 1309   10/11/21 1400  levofloxacin (LEVAQUIN) IVPB 500 mg  Status:  Discontinued        500 mg 100 mL/hr over 60 Minutes Intravenous Every 24 hours 10/11/21 1309 10/13/21 1251   10/11/21 0600  levofloxacin (LEVAQUIN) IVPB 500 mg  Status:  Discontinued         500 mg 100 mL/hr over 60 Minutes Intravenous Every 24 hours 10/10/21 1351 10/10/21 2105   10/10/21 1600  metroNIDAZOLE (FLAGYL) IVPB 500 mg  Status:  Discontinued        500 mg 100 mL/hr over 60 Minutes Intravenous Every 12 hours 10/10/21 1351 10/13/21 1236   10/10/21 0400  levofloxacin (LEVAQUIN) IVPB 750 mg        750 mg 100 mL/hr over 90 Minutes Intravenous  Once 10/10/21 0349 10/10/21 0727   10/10/21 0400  metroNIDAZOLE (FLAGYL) IVPB 500 mg        500 mg 100 mL/hr over 60 Minutes Intravenous  Once 10/10/21 0349 10/10/21 0545       Assessment/Plan: Colitis  Abdominal exam now completely benign and she is having loose BM which I expected to happen given her original colitis on CT.  Her pain is now resolved and her abdominal exam is benign.  From a surgical standpoint, she can be discharged when ok with Internal Medicine. She will not need surgical follow-up We will sign off   Coralie Keens MD 10/14/2021

## 2021-10-14 NOTE — Progress Notes (Signed)
Spoke to Dr Erlinda Hong via phone w/description of only 2 stools today and pt status, and MD decided no further testing needed to done on stools. Order to dc Enteric precautions received.

## 2021-10-14 NOTE — Progress Notes (Signed)
PROGRESS NOTE    BERLYN SAYLOR  YIR:[id number] DOB: 07-20-1945 DOA: 10/10/2021 PCP: Caren Macadam, MD    Brief Narrative:  Joanne Holmes is a 76 y.o. female with medical history significant of seasonal allergies, asthma,/COPD, hypertension, nephrolithiasis, left-sided carpal tunnel, history of pneumonia, lumbar spinal stenosis, postlaminectomy syndrome, class I obesity who presented to the emergency department due to abdominal pain associated with nausea, vomiting and diarrhea with most recent loose stools being bloody after she ate lasagna.  Found to have colitis.  Conservative treatment for now.   Assessment and Plan:  Acute colitis- unclear whether ischemic or infectious Seen by general surgery, improving, wbc normalized, diet advanced by gen surg, no plan for surgery, change abx to oral Appreciate general surgery input  Diarrhea Denies ab pain, no blood, clinically improving, start probiotics,   Hypokalemia/hypomagnesemia -Likely due to diarrhea Remain low, continue to replace, recheck in the morning    AKI (acute kidney injury) ([place]) on CKD IIIa -CT ab on presentation kidney unremarkable, but did show "Kidneys are normal, without renal calculi, or focal lesion. Mild prominence of the ureters bilateral greater on the left without filling defect. Bilateral renal vascular calcifications" -per RN bladder scan did not show any urine retaining  -patient reports her pcp recently referred her to a nephrologist Creatinine coming down from 2.26-1.13 -1.24 -1.16 Hold ARB/ACE. Hold diuretic. Avoid hypotension. Avoid nephrotoxins. Monitor intake and output. Monitor renal function electrolytes.    Essential hypertension With Hypertensive urgency on presentation Was on Metoprolol 5 mg IVP TID initially due to npo,  now back to  oral meds norvasc and metoprolol, increase metoprolol as BP remains elevated continue hold avapro due to elevated cr, may be able to resume tomorrow  if creatinine continues to improve Hydralazine 10 mg every 4 hours as needed.    Hyperglycemia Fasting Blood glucose 212 on presentation Likely due to stress, no prior history of diabetes Blood glucose 95 this morning Monitor      COPD (chronic obstructive pulmonary disease) (HCC) Not on home O2 No signs of decompensation at this time. Continue Trelegy Ellipta. Nebulized SABA as needed.    Class 1 obesity Body mass index is 34.75 kg/m.    h/o spinal stenosis, will have Pt see her       DVT prophylaxis: SCDs Start: 10/10/21 0943    Code Status: Full Code Family Communication: Granddaughter at bedside  Disposition Plan:  Level of care: Telemetry On 7/22 if lytes ok and diarrhea improves  Consultants:  GS   Subjective:  Having diarrhea, stool is brown reportedly, denies ab pain, no n/v, no fever, bp slightly elevated   WBC normalized, creatinine improved, K and mag remain low   Objective: Vitals:   10/13/21 0619 10/13/21 1245 10/13/21 2044 10/14/21 0500  BP: (!) 159/60 (!) 166/74 (!) 178/74 (!) 167/59  Pulse: 86 90 (!) 105 90  Resp: '18 18 20 18  '$ Temp: 99 F (37.2 C) 98.6 F (37 C) 98.3 F (36.8 C) 98.5 F (36.9 C)  TempSrc: Oral Oral Oral Oral  SpO2: 95% 97% 99% 94%  Weight:      Height:        Intake/Output Summary (Last 24 hours) at 10/14/2021 0734 Last data filed at 10/14/2021 5009 Gross per 24 hour  Intake 1000 ml  Output 1170 ml  Net -170 ml   Filed Weights   10/10/21 1014  Weight: 86.2 kg    Examination:   General: Appearance:    Obese female  NAD,  pleasant      Lungs:     respirations unlabored  Heart:    Normal heart rate. Normal rhythm. No murmurs, rubs, or gallops.    MS:   All extremities are intact.    Neurologic:   Awake, alert, oriented x 3. No apparent focal neurological           defect.        Data Reviewed: I have personally reviewed following labs and imaging studies  CBC: Recent Labs  Lab 10/10/21 0335  10/11/21 0517 10/12/21 0509 10/13/21 0506 10/14/21 0419  WBC 21.9* 15.6* 13.0* 12.1* 9.8  NEUTROABS 19.3*  --   --  9.6* 7.5  HGB 14.5 11.1* 11.2* 10.8* 10.5*  HCT 43.2 34.4* 34.6* 33.0* 31.7*  MCV 86.2 88.2 87.6 86.2 86.1  PLT 257 142* 162 177 962   Basic Metabolic Panel: Recent Labs  Lab 10/10/21 0335 10/11/21 0517 10/12/21 0509 10/13/21 0506 10/14/21 0419  NA 141 138 140 137 137  K 4.0 3.6 3.5 3.0* 3.3*  CL 109 109 108 107 109  CO2 19* 22 22 21* 19*  GLUCOSE 212* 102* 95 103* 108*  BUN 47* '20 11 12 10  '$ CREATININE 2.26* 1.30* 1.13* 1.24* 1.16*  CALCIUM 10.0 8.9 9.2 8.7* 8.9  MG  --   --   --  1.5* 1.6*   GFR: Estimated Creatinine Clearance: 40.7 mL/min (A) (by C-G formula based on SCr of 1.16 mg/dL (H)). Liver Function Tests: Recent Labs  Lab 10/10/21 0335 10/11/21 0517  AST 27 17  ALT 23 15  ALKPHOS 106 63  BILITOT 0.3 0.7  PROT 7.6 5.4*  ALBUMIN 4.3 2.9*   Recent Labs  Lab 10/10/21 0335  LIPASE 31   No results for input(s): "AMMONIA" in the last 168 hours. Coagulation Profile: No results for input(s): "INR", "PROTIME" in the last 168 hours. Cardiac Enzymes: No results for input(s): "CKTOTAL", "CKMB", "CKMBINDEX", "TROPONINI" in the last 168 hours. BNP (last 3 results) No results for input(s): "PROBNP" in the last 8760 hours. HbA1C: No results for input(s): "HGBA1C" in the last 72 hours. CBG: Recent Labs  Lab 10/12/21 1649 10/13/21 0226 10/13/21 0722 10/13/21 1540 10/14/21 0004  GLUCAP 90 117* 113* 102* 101*   Lipid Profile: No results for input(s): "CHOL", "HDL", "LDLCALC", "TRIG", "CHOLHDL", "LDLDIRECT" in the last 72 hours. Thyroid Function Tests: Recent Labs    10/13/21 0506  TSH 0.701   Anemia Panel: No results for input(s): "VITAMINB12", "FOLATE", "FERRITIN", "TIBC", "IRON", "RETICCTPCT" in the last 72 hours. Sepsis Labs: Recent Labs  Lab 10/10/21 1251 10/11/21 0750 10/11/21 1010 10/13/21 0506  LATICACIDVEN 2.6* 0.9 0.7 0.8     Recent Results (from the past 240 hour(s))  Culture, blood (routine x 2)     Status: None (Preliminary result)   Collection Time: 10/10/21  3:35 AM   Specimen: BLOOD  Result Value Ref Range Status   Specimen Description   Final    BLOOD BLOOD RIGHT FOREARM Performed at [facility] [address] [place], [place] [address]    Special Requests   Final    BOTTLES DRAWN AEROBIC AND ANAEROBIC Blood Culture adequate volume Performed at [facility] [address] [place], [place] [address]    Culture   Final    NO GROWTH 4 DAYS Performed at [facility], [place] [address] [place], [place] [address]    Report Status PENDING  Incomplete  Culture, blood (routine  x 2)     Status: None (Preliminary result)   Collection Time: 10/10/21  4:15 AM   Specimen: BLOOD  Result Value Ref Range Status   Specimen Description   Final    BLOOD LEFT ANTECUBITAL Performed at [facility] [address] [place], [place] [address]    Special Requests   Final    BOTTLES DRAWN AEROBIC ONLY Blood Culture results may not be optimal due to an inadequate volume of blood received in culture bottles Performed at [facility] [address] [place], [place] [address]    Culture   Final    NO GROWTH 4 DAYS Performed at [facility], [place] [address], [place], [place] [address]    Report Status PENDING  Incomplete         Radiology Studies: No results found.      Scheduled Meds:  amLODipine  10 mg Oral Daily   fluticasone  2 spray Each Nare Daily   fluticasone furoate-vilanterol  1 puff Inhalation Daily   And   umeclidinium bromide  1 puff Inhalation Daily   levofloxacin  500 mg Oral Daily   metoprolol succinate  50 mg Oral QHS   metroNIDAZOLE  500 mg Oral Q8H   montelukast  10 mg Oral QHS   pantoprazole  40 mg Oral Daily   potassium chloride  40 mEq Oral Q4H   sodium bicarbonate  650 mg Oral BID    Continuous Infusions:  magnesium sulfate bolus IVPB       LOS: 3 days       Florencia Reasons, MD PhD FACP [facility] Available via Epic secure chat 7am-7pm After these hours, please refer to coverage provider listed on [email or web address] 10/14/2021, 7:34 AM

## 2021-10-14 NOTE — Progress Notes (Signed)
  Transition of Care [facility]) Screening Note   Patient Details  Name: Joanne Holmes Date of Birth: March 23, 1946   Transition of Care [facility]) CM/SW Contact:    Lennart Pall, LCSW Phone Number: 10/14/2021, 11:53 AM    Transition of Care Department [facility]) has reviewed patient and no TOC needs have been identified at this time. We will continue to monitor patient advancement through interdisciplinary progression rounds. If new patient transition needs arise, please place a TOC consult.

## 2021-10-14 NOTE — Care Management Important Message (Signed)
Important Message  Patient Details IM Letter given Name: Joanne Holmes MRN: [id number] Date of Birth: March 03, 1946   Medicare Important Message Given:  Yes     Kerin Salen 10/14/2021, 11:51 AM

## 2021-10-15 ENCOUNTER — Other Ambulatory Visit (HOSPITAL_COMMUNITY): Payer: Self-pay

## 2021-10-15 DIAGNOSIS — K529 Noninfective gastroenteritis and colitis, unspecified: Secondary | ICD-10-CM | POA: Diagnosis not present

## 2021-10-15 LAB — CBC WITH DIFFERENTIAL/PLATELET
Abs Immature Granulocytes: 0.16 10*3/uL — ABNORMAL HIGH (ref 0.00–0.07)
Basophils Absolute: 0.1 10*3/uL (ref 0.0–0.1)
Basophils Relative: 1 %
Eosinophils Absolute: 0.2 10*3/uL (ref 0.0–0.5)
Eosinophils Relative: 3 %
HCT: 31.8 % — ABNORMAL LOW (ref 36.0–46.0)
Hemoglobin: 10.6 g/dL — ABNORMAL LOW (ref 12.0–15.0)
Immature Granulocytes: 2 %
Lymphocytes Relative: 16 %
Lymphs Abs: 1.5 10*3/uL (ref 0.7–4.0)
MCH: 28.6 pg (ref 26.0–34.0)
MCHC: 33.3 g/dL (ref 30.0–36.0)
MCV: 85.9 fL (ref 80.0–100.0)
Monocytes Absolute: 0.8 10*3/uL (ref 0.1–1.0)
Monocytes Relative: 9 %
Neutro Abs: 6.3 10*3/uL (ref 1.7–7.7)
Neutrophils Relative %: 69 %
Platelets: 215 10*3/uL (ref 150–400)
RBC: 3.7 MIL/uL — ABNORMAL LOW (ref 3.87–5.11)
RDW: 15.2 % (ref 11.5–15.5)
WBC: 9.1 10*3/uL (ref 4.0–10.5)
nRBC: 0 % (ref 0.0–0.2)

## 2021-10-15 LAB — CULTURE, BLOOD (ROUTINE X 2)
Culture: NO GROWTH
Culture: NO GROWTH
Special Requests: ADEQUATE

## 2021-10-15 LAB — MAGNESIUM: Magnesium: 1.9 mg/dL (ref 1.7–2.4)

## 2021-10-15 LAB — GLUCOSE, CAPILLARY
Glucose-Capillary: 107 mg/dL — ABNORMAL HIGH (ref 70–99)
Glucose-Capillary: 91 mg/dL (ref 70–99)

## 2021-10-15 LAB — BASIC METABOLIC PANEL
Anion gap: 8 (ref 5–15)
BUN: 10 mg/dL (ref 8–23)
CO2: 19 mmol/L — ABNORMAL LOW (ref 22–32)
Calcium: 8.7 mg/dL — ABNORMAL LOW (ref 8.9–10.3)
Chloride: 110 mmol/L (ref 98–111)
Creatinine, Ser: 1.03 mg/dL — ABNORMAL HIGH (ref 0.44–1.00)
GFR, Estimated: 56 mL/min — ABNORMAL LOW (ref >60)
Glucose, Bld: 103 mg/dL — ABNORMAL HIGH (ref 70–99)
Potassium: 3.6 mmol/L (ref 3.5–5.1)
Sodium: 137 mmol/L (ref 135–145)

## 2021-10-15 MED ORDER — FUROSEMIDE 40 MG PO TABS: 20.0000 mg | ORAL_TABLET | Freq: Every day | ORAL | Status: AC

## 2021-10-15 MED ORDER — SODIUM BICARBONATE 650 MG PO TABS: 650.0000 mg | ORAL_TABLET | Freq: Two times a day (BID) | ORAL | 0 refills | Status: AC

## 2021-10-15 MED ORDER — METRONIDAZOLE 500 MG PO TABS: 500.0000 mg | ORAL_TABLET | Freq: Three times a day (TID) | ORAL | 0 refills | Status: AC

## 2021-10-15 MED ORDER — LEVOFLOXACIN 500 MG PO TABS: 500.0000 mg | ORAL_TABLET | Freq: Every day | ORAL | 0 refills | Status: AC

## 2021-10-15 MED ORDER — SACCHAROMYCES BOULARDII 250 MG PO CAPS: 250.0000 mg | ORAL_CAPSULE | Freq: Two times a day (BID) | ORAL | 0 refills | Status: AC

## 2021-10-15 MED ORDER — SACCHAROMYCES BOULARDII 250 MG PO CAPS
250.0000 mg | ORAL_CAPSULE | Freq: Two times a day (BID) | ORAL | 0 refills | Status: DC
  Filled 2021-10-15: qty 28, 14d supply, fill #0

## 2021-10-15 NOTE — Progress Notes (Signed)
Assessment unchanged. Pt verbalized understanding of dc instructions including medications and follow up care. Discharged to front entrance via wc accompanied by NT.

## 2021-10-15 NOTE — Discharge Summary (Signed)
Discharge Summary  Joanne Holmes PHX:[id number] DOB: October 13, 1945  PCP: Caren Macadam, MD  Admit date: 10/10/2021 Discharge date: 10/15/2021    Time spent:  58mns  Recommendations for Outpatient Follow-up:  F/u with PCP within a week  for hospital discharge follow up, repeat cbc/bmp at follow up F/u with nephrology     Discharge Diagnoses:  Active Hospital Problems   Diagnosis Date Noted   Acute colitis 10/10/2021   Colitis 10/11/2021   Class 1 obesity 10/10/2021   Hyperglycemia 10/10/2021   Essential hypertension 05/11/2014   AKI (acute kidney injury) ([facility] 05/11/2014   COPD (chronic obstructive pulmonary disease) ([facility] 03/18/2012   Hypertensive urgency 03/18/2012    Resolved Hospital Problems  No resolved problems to display.    Discharge Condition: stable  Diet recommendation: heart healthy  Filed Weights   10/10/21 1014  Weight: 86.2 kg    History of present illness: ( admitting MD Dr OOlevia Bowens HPI: BVICIE CECHis a 76y.o. female with medical history significant of seasonal allergies, asthma,/COPD, hypertension, nephrolithiasis, left-sided carpal tunnel, history of pneumonia, lumbar spinal stenosis, postlaminectomy syndrome, class I obesity who presented to the emergency department due to abdominal pain associated with nausea, vomiting and diarrhea with most recent loose stools being bloody after she ate lasagna, garlic knots and solid yesterday evening.  No constipation or melena history.  No flank pain, dysuria, frequency or hematuria.She has had chills, but denied fever, rhinorrhea, sore throat, wheezing or hemoptysis.  No chest pain, palpitations, diaphoresis, PND, orthopnea or pitting edema of the lower extremities.   No polyuria, polydipsia, polyphagia or blurred vision.    ED course: Initial vital signs were temperature 100 F, pulse 107, respiration 15, BP 219/91 mmHg O2 sat 95% on room air.  The patient received ondansetron 4 mg IVP LR 2000 mL bolus  fentanyl 50 mcg IVP x2 and acetaminophen 650 mg p.o. x1 I added metoprolol to tartrate 5 mg IVP, hydralazine 50 mg IVP, LR bolus 1000 mL over 2 hours.   Lab work: Her CBC showed a white count 21.9, hemoglobin 14.5 g/dL and platelets 257.  CMP showed a CO2 of 19 mmol/L, the rest of the electrolytes and LFTs were normal.  Glucose 212, BUN 47 creatinine 2.26 mg/dL.  Lipase was normal.  Lactic acid was 2.9 then 2.6 then 2.6 mmol/L.   Imaging: CT abdomen/pelvis without contrast suspicious for the ascending and rectosigmoid colon ischemic versus inflammatory or less likely infectious colitis.  Hospital Course:  Principal Problem:   Acute colitis Active Problems:   Hypertensive urgency   COPD (chronic obstructive pulmonary disease) (HCC)   AK[facility] (acute kidney injury) ([facility]   Essential hypertension   Class 1 obesity   Hyperglycemia   Colitis   Assessment and Plan:   Acute colitis- unclear whether ischemic or infectious Seen by general surgery, improving, wbc normalized, diet advanced by gen surg, no plan for surgery, change abx to oral  ( levaquin/flagyl) to finish treatment course ( h/o pcn and cefaclor allergy) She is cleared to discharge home with pcp follow up and to discuss with colonoscopy with pcp   Diarrhea Gi prc panel negative, Denies ab pain, no blood, clinically improving, start probiotics,  F/u with pcp   Hypokalemia/hypomagnesemia -Likely due to diarrhea -replaced, improved, diarrhea almost resolved     AK[facility] [place]acute kidney injury) ([facility] on CKD IIIa -CT ab on presentation kidney unremarkable, but did show "Kidneys are normal, without renal calculi, or focal lesion. Mild prominence of the ureters  bilateral greater on the left without filling defect. Bilateral renal vascular calcifications" -per RN bladder scan did not show any urine retaining  -Creatinine coming down from 2.26-1.13 -1.24 -1.16-1.03 Home meds ACEi and diuretic held in the hospital, resumed at discharge F/u  with pcp and nephrology.     Essential hypertension With Hypertensive urgency on presentation Bp improved, discharged on home meds F/u with pcp    Hyperglycemia Fasting Blood glucose 212 on presentation Likely due to stress, no prior history of diabetes Blood glucose 103  this morning F/u with pcp        COPD (chronic obstructive pulmonary disease) ([place]) Not on home O2 No signs of decompensation at this time. Continue Trelegy Ellipta. Nebulized SABA as needed.  H/o spinal stenosis, no acute issues, did well with PT     Class 1 obesity Body mass index is 34.75 kg/m.    h/o spinal stenosis, will have Pt see her     Discharge Exam: BP (!) 168/67 (BP Location: Left Arm)   Pulse 92   Temp 98.4 F (36.9 C)   Resp 16   Ht '5\' 2"'$  (1.575 m)   Wt 86.2 kg   SpO2 96%   BMI 34.75 kg/m   General: NAD Cardiovascular: RRR Respiratory: normal respiratory effort     Discharge Instructions     Diet - low sodium heart healthy   Complete by: As directed    Increase activity slowly   Complete by: As directed       Allergies as of 10/15/2021       Reactions   Ceclor [cefaclor] Other (See Comments)   Pt does not remember   Cefzil [cefprozil] Other (See Comments)   Pt does not remember   Erythromycin Itching, Rash   Griseofulvin Swelling   Morphine And Related Itching   Penicillins Anaphylaxis   Has patient had a PCN reaction causing immediate rash, facial/tongue/throat swelling, SOB or lightheadedness with hypotension: Yes Has patient had a PCN reaction causing severe rash involving mucus membranes or skin necrosis: No Has patient had a PCN reaction that required hospitalization: No Has patient had a PCN reaction occurring within the last 10 years: No If all of the above answers are "NO", then may proceed with Cephalosporin use.   Morpholine Salicylate         Medication List     STOP taking these medications    irbesartan 150 MG tablet Commonly known as:  AVAPRO   pantoprazole 40 MG tablet Commonly known as: PROTONIX       TAKE these medications    amLODipine 10 MG tablet Commonly known as: NORVASC Take 10 mg by mouth daily.   atorvastatin 40 MG tablet Commonly known as: LIPITOR Take 40 mg by mouth at bedtime.   benazepril 40 MG tablet Commonly known as: LOTENSIN Take 40 mg by mouth 2 (two) times daily.   fexofenadine 180 MG tablet Commonly known as: ALLEGRA Take by mouth.   furosemide 40 MG tablet Commonly known as: Lasix Take 0.5 tablets (20 mg total) by mouth daily.   levofloxacin 500 MG tablet Commonly known as: LEVAQUIN Take 1 tablet (500 mg total) by mouth daily for 4 days.   metoprolol succinate 50 MG 24 hr tablet Commonly known as: TOPROL-XL Take 50 mg by mouth daily.   metroNIDAZOLE 500 MG tablet Commonly known as: FLAGYL Take 1 tablet (500 mg total) by mouth every 8 (eight) hours for 8 days.   montelukast 10 MG tablet Commonly  known as: SINGULAIR Take 10 mg by mouth at bedtime.   saccharomyces boulardii 250 MG capsule Commonly known as: FLORASTOR Take 1 capsule (250 mg total) by mouth 2 (two) times daily for 14 days.   sodium bicarbonate 650 MG tablet Take 1 tablet (650 mg total) by mouth 2 (two) times daily for 2 days.   traMADol 50 MG tablet Commonly known as: ULTRAM Take 50 mg by mouth 3 (three) times daily. pain   Trelegy Ellipta 200-62.5-25 MCG/ACT Aepb Generic drug: Fluticasone-Umeclidin-Vilant Inhale 1 puff into the lungs daily.   Vitamin D3 50 MCG (2000 UT) capsule Take 2,000 Units by mouth at bedtime.       Allergies  Allergen Reactions   Ceclor [Cefaclor] Other (See Comments)    Pt does not remember   Cefzil [Cefprozil] Other (See Comments)    Pt does not remember   Erythromycin Itching and Rash   Griseofulvin Swelling   Morphine And Related Itching   Penicillins Anaphylaxis    Has patient had a PCN reaction causing immediate rash, facial/tongue/throat swelling, SOB or  lightheadedness with hypotension: Yes Has patient had a PCN reaction causing severe rash involving mucus membranes or skin necrosis: No Has patient had a PCN reaction that required hospitalization: No Has patient had a PCN reaction occurring within the last 10 years: No If all of the above answers are "NO", then may proceed with Cephalosporin use.   Morpholine Salicylate     Follow-up Information     Caren Macadam, MD. Schedule an appointment as soon as possible for a visit in 1 week(s).   Specialty: Family Medicine Why: To follow up on improvement from colitis - consider GI referral to consider colonoscopy at some point in the future. pcp to repeat basic labs including cbc/bmp at follow up. Contact information: [address] [place] [address] [phone]         Tracie Harrier, MD Follow up.   Specialties: Internal Medicine, Nephrology Contact information: [address] [place] [address] [phone]                  The results of significant diagnostics from this hospitalization (including imaging, microbiology, ancillary and laboratory) are listed below for reference.    Significant Diagnostic Studies: CT ABDOMEN PELVIS WO CONTRAST  Result Date: 10/10/2021 CLINICAL DATA:  Mesenteric ischemia, acute; abdominal pain all over with rectal bleeding EXAM: CT ABDOMEN AND PELVIS WITHOUT CONTRAST TECHNIQUE: Multidetector CT imaging of the abdomen and pelvis was performed following the standard protocol without IV contrast. RADIATION DOSE REDUCTION: This exam was performed according to the departmental dose-optimization program which includes automated exposure control, adjustment of the mA and/or kV according to patient size and/or use of iterative reconstruction technique. COMPARISON:  May 10, 2014 FINDINGS: Lower chest: There is a minor atelectasis versus pleuropulmonary scarring seen at the left lung base. No discrete nodule.  Hepatobiliary: No focal liver abnormality is seen. No gallstones, gallbladder wall thickening, or biliary dilatation. Gallbladder is contracted. No portal venous gas. Pancreas: Fatty infiltration of the pancreas. No pancreatic ductal dilatation or surrounding inflammatory changes. Spleen: Normal in size without focal abnormality. Adrenals/Urinary Tract: Adrenal glands are unremarkable. Kidneys are normal, without renal calculi, or focal lesion. Mild prominence of the ureters bilateral greater on the left without filling defect. Bilateral renal vascular calcifications. Previously seen perinephric stranding has resolved. Bladder is unremarkable. Stomach/Bowel: Small hiatal hernia. Stomach is within normal limits. Appendix appears normal. There is diffuse moderately  severe bowel wall thickening of the descending colon seen extending to the rectosigmoid. There is moderate pericolonic inflammatory stranding seen. No evidence of pneumatosis intestinalis. Vascular/Lymphatic: There are extensive atheromatous calcifications of the abdominal aorta seen extending to the iliac arteries Reproductive: Prostate is unremarkable. Other: There is small fluid seen at the dependent pelvis greater on the left (image 70/2) Musculoskeletal: Moderate lumbar spondylosis with grade 1 anterolisthesis at L3-L4 and L4-L5 with vacuum disc phenomenon at L3-L4 through L5-S1. IMPRESSION: 1. Moderate to severe diffuse edema/thickening of the descending colonic wall extending to the rectosigmoid with moderate pericolonic inflammatory stranding. Small fluid in the dependent pelvis at the left side. Tiny fluid at the left upper paracolic gutter (image 95/2). The differential considerations are ischemic colitis, inflammatory colitis and less likely colonic diverticulitis. No pneumatosis intestinalis or portal venous gas. 2. Extensive atheromatous calcifications of the abdominal aorta extending into the iliac arteries. 3.  Smaller hiatal hernia.  Electronically Signed   By: Frazier Richards M.D.   On: 10/10/2021 07:59   XR C-ARM NO REPORT  Result Date: 09/22/2021 Please see Notes tab for imaging impression.   Microbiology: Recent Results (from the past 240 hour(s))  Culture, blood (routine x 2)     Status: None   Collection Time: 10/10/21  3:35 AM   Specimen: BLOOD  Result Value Ref Range Status   Specimen Description   Final    BLOOD BLOOD RIGHT FOREARM Performed at [facility] [address] [place], [place] [address]    Special Requests   Final    BOTTLES DRAWN AEROBIC AND ANAEROBIC Blood Culture adequate volume Performed at [facility] [address], [place], [place] [address]    Culture   Final    NO GROWTH 5 DAYS Performed at [facility], [place] [address] [place], [place] [address]    Report Status 10/15/2021 FINAL  Final  Culture, blood (routine x 2)     Status: None   Collection Time: 10/10/21  4:15 AM   Specimen: BLOOD  Result Value Ref Range Status   Specimen Description   Final    BLOOD LEFT ANTECUBITAL Performed at [facility] [address], [place], [place] [address]    Special Requests   Final    BOTTLES DRAWN AEROBIC ONLY Blood Culture results may not be optimal due to an inadequate volume of blood received in culture bottles Performed at [facility] [address] [place], [place] [address]    Culture   Final    NO GROWTH 5 DAYS Performed at [facility], [place] [address], [place], [place] [address]    Report Status 10/15/2021 FINAL  Final  Gastrointestinal Panel by PCR , Stool     Status: None   Collection Time: 10/13/21  3:55 PM   Specimen: Stool  Result Value Ref Range Status   Campylobacter species NOT DETECTED NOT DETECTED Final   Plesimonas shigelloides NOT DETECTED NOT DETECTED Final   Salmonella species NOT DETECTED NOT DETECTED Final   Yersinia enterocolitica NOT DETECTED NOT DETECTED Final    Vibrio species NOT DETECTED NOT DETECTED Final   Vibrio cholerae NOT DETECTED NOT DETECTED Final   Enteroaggregative E coli (EAEC) NOT DETECTED NOT DETECTED Final   Enteropathogenic E coli (EPEC) NOT DETECTED NOT DETECTED Final   Enterotoxigenic E coli (ETEC) NOT DETECTED NOT DETECTED Final   Shiga like toxin producing E coli (STEC) NOT DETECTED NOT DETECTED Final  Shigella/Enteroinvasive E coli (EIEC) NOT DETECTED NOT DETECTED Final   Cryptosporidium NOT DETECTED NOT DETECTED Final   Cyclospora cayetanensis NOT DETECTED NOT DETECTED Final   Entamoeba histolytica NOT DETECTED NOT DETECTED Final   Giardia lamblia NOT DETECTED NOT DETECTED Final   Adenovirus F40/41 NOT DETECTED NOT DETECTED Final   Astrovirus NOT DETECTED NOT DETECTED Final   Norovirus GI/GII NOT DETECTED NOT DETECTED Final   Rotavirus A NOT DETECTED NOT DETECTED Final   Sapovirus (I, II, IV, and V) NOT DETECTED NOT DETECTED Final    Comment: Performed at [facility], [address] [place], [place] [address]     Labs: Basic Metabolic Panel: Recent Labs  Lab 10/11/21 0517 10/12/21 0509 10/13/21 0506 10/14/21 0419 10/15/21 0530  NA 138 140 137 137 137  K 3.6 3.5 3.0* 3.3* 3.6  CL 109 108 107 109 110  CO2 22 22 21* 19* 19*  GLUCOSE 102* 95 103* 108* 103*  BUN '20 11 12 10 10  '$ CREATININE 1.30* 1.13* 1.24* 1.16* 1.03*  CALCIUM 8.9 9.2 8.7* 8.9 8.7*  MG  --   --  1.5* 1.6* 1.9   Liver Function Tests: Recent Labs  Lab 10/10/21 0335 10/11/21 0517  AST 27 17  ALT 23 15  ALKPHOS 106 63  BILITOT 0.3 0.7  PROT 7.6 5.4*  ALBUMIN 4.3 2.9*   Recent Labs  Lab 10/10/21 0335  LIPASE 31   No results for input(s): "AMMONIA" in the last 168 hours. CBC: Recent Labs  Lab 10/10/21 0335 10/11/21 0517 10/12/21 0509 10/13/21 0506 10/14/21 0419 10/15/21 0530  WBC 21.9* 15.6* 13.0* 12.1* 9.8 9.1  NEUTROABS 19.3*  --   --  9.6* 7.5 6.3  HGB 14.5 11.1* 11.2* 10.8* 10.5* 10.6*  HCT 43.2 34.4* 34.6*  33.0* 31.7* 31.8*  MCV 86.2 88.2 87.6 86.2 86.1 85.9  PLT 257 142* 162 177 198 215   Cardiac Enzymes: No results for input(s): "CKTOTAL", "CKMB", "CKMBINDEX", "TROPONINI" in the last 168 hours. BNP: BNP (last 3 results) No results for input(s): "BNP" in the last 8760 hours.  ProBNP (last 3 results) No results for input(s): "PROBNP" in the last 8760 hours.  CBG: Recent Labs  Lab 10/14/21 0004 10/14/21 0734 10/14/21 1537 10/15/21 0038 10/15/21 0748  GLUCAP 101* 106* 114* 91 107*    FURTHER DISCHARGE INSTRUCTIONS:   Get Medicines reviewed and adjusted: Please take all your medications with you for your next visit with your Primary MD   Laboratory/radiological data: Please request your Primary MD to go over all hospital tests and procedure/radiological results at the follow up, please ask your Primary MD to get all Hospital records sent to his/her office.   In some cases, they will be blood work, cultures and biopsy results pending at the time of your discharge. Please request that your primary care M.D. goes through all the records of your hospital data and follows up on these results.   Also Note the following: If you experience worsening of your admission symptoms, develop shortness of breath, life threatening emergency, suicidal or homicidal thoughts you must seek medical attention immediately by calling 911 or calling your MD immediately  if symptoms less severe.   You must read complete instructions/literature along with all the possible adverse reactions/side effects for all the Medicines you take and that have been prescribed to you. Take any new Medicines after you have completely understood and accpet all the possible adverse reactions/side effects.    Do not drive when  taking Pain medications or sleeping medications (Benzodaizepines)   Do not take more than prescribed Pain, Sleep and Anxiety Medications. It is not advisable to combine anxiety,sleep and pain medications  without talking with your primary care practitioner   Special Instructions: If you have smoked or chewed Tobacco  in the last 2 yrs please stop smoking, stop any regular Alcohol  and or any Recreational drug use.   Wear Seat belts while driving.   Please note: You were cared for by a hospitalist during your hospital stay. Once you are discharged, your primary care physician will handle any further medical issues. Please note that NO REFILLS for any discharge medications will be authorized once you are discharged, as it is imperative that you return to your primary care physician (or establish a relationship with a primary care physician if you do not have one) for your post hospital discharge needs so that they can reassess your need for medications and monitor your lab values.     Signed:  Florencia Reasons MD, PhD, FACP  [facility] 10/15/2021, 7:27 PM

## 2021-10-15 NOTE — Plan of Care (Signed)

## 2021-10-18 NOTE — Procedures (Signed)
Lumbosacral Transforaminal Epidural Steroid Injection - Sub-Pedicular Approach with Fluoroscopic Guidance  Patient: Joanne Holmes      Date of Birth: 17-Aug-1945 MRN: [id number] PCP: Caren Macadam, MD      Visit Date: 09/22/2021   Universal Protocol:    Date/Time: 09/22/2021  Consent Given By: the patient  Position: PRONE  Additional Comments: Vital signs were monitored before and after the procedure. Patient was prepped and draped in the usual sterile fashion. The correct patient, procedure, and site was verified.   Injection Procedure Details:   Procedure diagnoses: Lumbar radiculopathy [M54.16]    Meds Administered:  Meds ordered this encounter  Medications   DISCONTD: methylPREDNISolone acetate (DEPO-MEDROL) injection 80 mg    Laterality: Bilateral  Location/Site: L3  Needle:5.0 in., 22 ga.  Short bevel or Quincke spinal needle  Needle Placement: Transforaminal  Findings:    -Comments: Excellent flow of contrast along the nerve, nerve root and into the epidural space.  Procedure Details: After squaring off the end-plates to get a true AP view, the C-arm was positioned so that an oblique view of the foramen as noted above was visualized. The target area is just inferior to the "nose of the scotty dog" or sub pedicular. The soft tissues overlying this structure were infiltrated with 2-3 ml. of 1% Lidocaine without Epinephrine.  The spinal needle was inserted toward the target using a "trajectory" view along the fluoroscope beam.  Under AP and lateral visualization, the needle was advanced so it did not puncture dura and was located close the 6 O'Clock position of the pedical in AP tracterory. Biplanar projections were used to confirm position. Aspiration was confirmed to be negative for CSF and/or blood. A 1-2 ml. volume of Isovue-250 was injected and flow of contrast was noted at each level. Radiographs were obtained for documentation purposes.   After attaining  the desired flow of contrast documented above, a 0.5 to 1.0 ml test dose of 0.25% Marcaine was injected into each respective transforaminal space.  The patient was observed for 90 seconds post injection.  After no sensory deficits were reported, and normal lower extremity motor function was noted,   the above injectate was administered so that equal amounts of the injectate were placed at each foramen (level) into the transforaminal epidural space.   Additional Comments:  No complications occurred Dressing: 2 x 2 sterile gauze and Band-Aid    Post-procedure details: Patient was observed during the procedure. Post-procedure instructions were reviewed.  Patient left the clinic in stable condition.

## 2021-10-18 NOTE — Progress Notes (Signed)
ENGLISH TOMER - 76 y.o. female MRN [id number]  Date of birth: [birth year]  Office Visit Note: Visit Date: 09/22/2021 PCP: Caren Macadam, MD Referred by: Caren Macadam, MD  Subjective: Chief Complaint  Patient presents with   Lower Back - Pain   HPI:  Joanne Holmes is a 76 y.o. female who comes in today for planned repeat Bilateral L3-4  Lumbar Transforaminal epidural steroid injection with fluoroscopic guidance.  The patient has failed conservative care including home exercise, medications, time and activity modification.  This injection will be diagnostic and hopefully therapeutic.  Please see requesting physician notes for further details and justification. Patient received more than 50% pain relief from prior injection.   Referring: Barnet Pall, FNP   ROS Otherwise per HPI.  Assessment & Plan: Visit Diagnoses:    ICD-10-CM   1. Lumbar radiculopathy  M54.16 XR C-ARM NO REPORT    Epidural Steroid injection    DISCONTINUED: methylPREDNISolone acetate (DEPO-MEDROL) injection 80 mg    2. Spinal stenosis of lumbar region with neurogenic claudication  M48.062     3. Post laminectomy syndrome  M96.1       Plan: No additional findings.   Meds & Orders:  Meds ordered this encounter  Medications   DISCONTD: methylPREDNISolone acetate (DEPO-MEDROL) injection 80 mg    Orders Placed This Encounter  Procedures   XR C-ARM NO REPORT   Epidural Steroid injection    Follow-up: Return for visit to requesting provider as needed.   Procedures: No procedures performed  Lumbosacral Transforaminal Epidural Steroid Injection - Sub-Pedicular Approach with Fluoroscopic Guidance  Patient: Joanne Holmes      Date of Birth: [birth year] MRN: [id number] PCP: Caren Macadam, MD      Visit Date: 09/22/2021   Universal Protocol:    Date/Time: 09/22/2021  Consent Given By: the patient  Position: PRONE  Additional Comments: Vital signs were monitored before and after the  procedure. Patient was prepped and draped in the usual sterile fashion. The correct patient, procedure, and site was verified.   Injection Procedure Details:   Procedure diagnoses: Lumbar radiculopathy [M54.16]    Meds Administered:  Meds ordered this encounter  Medications   DISCONTD: methylPREDNISolone acetate (DEPO-MEDROL) injection 80 mg    Laterality: Bilateral  Location/Site: L3  Needle:5.0 in., 22 ga.  Short bevel or Quincke spinal needle  Needle Placement: Transforaminal  Findings:    -Comments: Excellent flow of contrast along the nerve, nerve root and into the epidural space.  Procedure Details: After squaring off the end-plates to get a true AP view, the C-arm was positioned so that an oblique view of the foramen as noted above was visualized. The target area is just inferior to the "nose of the scotty dog" or sub pedicular. The soft tissues overlying this structure were infiltrated with 2-3 ml. of 1% Lidocaine without Epinephrine.  The spinal needle was inserted toward the target using a "trajectory" view along the fluoroscope beam.  Under AP and lateral visualization, the needle was advanced so it did not puncture dura and was located close the 6 O'Clock position of the pedical in AP tracterory. Biplanar projections were used to confirm position. Aspiration was confirmed to be negative for CSF and/or blood. A 1-2 ml. volume of Isovue-250 was injected and flow of contrast was noted at each level. Radiographs were obtained for documentation purposes.   After attaining the desired flow of contrast documented above, a 0.5 to 1.0 ml test dose of 0.25% Marcaine was  injected into each respective transforaminal space.  The patient was observed for 90 seconds post injection.  After no sensory deficits were reported, and normal lower extremity motor function was noted,   the above injectate was administered so that equal amounts of the injectate were placed at each foramen (level)  into the transforaminal epidural space.   Additional Comments:  No complications occurred Dressing: 2 x 2 sterile gauze and Band-Aid    Post-procedure details: Patient was observed during the procedure. Post-procedure instructions were reviewed.  Patient left the clinic in stable condition.    Clinical History: Lumbar spine MRI dated 03/01/2016   Alignment: 4 mm anterolisthesis L3-4 with mild progression since  2013. 6.6 mm anterolisthesis L4-5 unchanged. Mild retrolisthesis  L1-2.     Vertebrae:  Negative for fracture or mass.  Normal bone marrow     Conus medullaris: Extends to the L1-2 level and appears normal.     Paraspinal and other soft tissues: No retroperitoneal mass or  adenopathy. Paraspinous muscles are symmetric with mild atrophy.     Disc levels:     L1-2: Mild retrolisthesis. Disc bulging and mild facet degeneration  without significant stenosis.     L2-3:  Mild disc and facet degeneration.  Mild spinal stenosis.     L3-4: Severe spinal stenosis which has progressed in the interval.  There is anterior listhesis of L3 on L4. Diffuse disc bulging.  Severe facet degeneration has progressed in the interval. There is  marked subarticular stenosis bilaterally as well as moderate  foraminal encroachment bilaterally.     L4-5: Severe spinal stenosis has progressed in the interval. Severe  facet degeneration. Grade 1 anterior listhesis unchanged.  Subarticular and foraminal encroachment bilaterally.     L5-S1: Severe disc degeneration with disc space narrowing and  endplate spurring. Prior laminectomy on the left. Diffuse endplate  scarring is present. There is soft tissue surrounding the left S1  nerve root which is unchanged most likely due to scar tissue. No  recurrent disc protrusion. Mild foraminal narrowing bilaterally due  to spurring.     Objective:  VS:  HT:    WT:   BMI:     BP:(!) 162/69  HR:71bpm  TEMP: ( )  RESP:  Physical Exam Vitals  and nursing note reviewed.  Constitutional:      General: She is not in acute distress.    Appearance: Normal appearance. She is not ill-appearing.  HENT:     Head: Normocephalic and atraumatic.     Right Ear: External ear normal.     Left Ear: External ear normal.  Eyes:     Extraocular Movements: Extraocular movements intact.  Cardiovascular:     Rate and Rhythm: Normal rate.     Pulses: Normal pulses.  Pulmonary:     Effort: Pulmonary effort is normal. No respiratory distress.  Abdominal:     General: There is no distension.     Palpations: Abdomen is soft.  Musculoskeletal:        General: Tenderness present.     Cervical back: Neck supple.     Right lower leg: No edema.     Left lower leg: No edema.     Comments: Patient has good distal strength with no pain over the greater trochanters.  No clonus or focal weakness.  Skin:    Findings: No erythema, lesion or rash.  Neurological:     General: No focal deficit present.     Mental Status: She is alert  and oriented to person, place, and time.     Sensory: No sensory deficit.     Motor: No weakness or abnormal muscle tone.     Coordination: Coordination normal.  Psychiatric:        Mood and Affect: Mood normal.        Behavior: Behavior normal.      Imaging: No results found.

## 2021-10-31 DIAGNOSIS — N184 Chronic kidney disease, stage 4 (severe): Secondary | ICD-10-CM | POA: Diagnosis not present

## 2021-10-31 DIAGNOSIS — Z8719 Personal history of other diseases of the digestive system: Secondary | ICD-10-CM | POA: Diagnosis not present

## 2021-10-31 DIAGNOSIS — I10 Essential (primary) hypertension: Secondary | ICD-10-CM | POA: Diagnosis not present

## 2021-10-31 DIAGNOSIS — E7800 Pure hypercholesterolemia, unspecified: Secondary | ICD-10-CM | POA: Diagnosis not present

## 2021-10-31 DIAGNOSIS — G894 Chronic pain syndrome: Secondary | ICD-10-CM | POA: Diagnosis not present

## 2021-10-31 DIAGNOSIS — J449 Chronic obstructive pulmonary disease, unspecified: Secondary | ICD-10-CM | POA: Diagnosis not present

## 2021-11-08 DIAGNOSIS — N184 Chronic kidney disease, stage 4 (severe): Secondary | ICD-10-CM | POA: Diagnosis not present

## 2021-11-08 DIAGNOSIS — D649 Anemia, unspecified: Secondary | ICD-10-CM | POA: Diagnosis not present

## 2021-11-23 DIAGNOSIS — N183 Chronic kidney disease, stage 3 unspecified: Secondary | ICD-10-CM | POA: Diagnosis not present

## 2021-11-23 DIAGNOSIS — N1832 Chronic kidney disease, stage 3b: Secondary | ICD-10-CM | POA: Diagnosis not present

## 2021-11-23 DIAGNOSIS — I129 Hypertensive chronic kidney disease with stage 1 through stage 4 chronic kidney disease, or unspecified chronic kidney disease: Secondary | ICD-10-CM | POA: Diagnosis not present

## 2021-11-23 DIAGNOSIS — E669 Obesity, unspecified: Secondary | ICD-10-CM | POA: Diagnosis not present

## 2021-12-02 DIAGNOSIS — E785 Hyperlipidemia, unspecified: Secondary | ICD-10-CM | POA: Diagnosis not present

## 2021-12-02 DIAGNOSIS — I10 Essential (primary) hypertension: Secondary | ICD-10-CM | POA: Diagnosis not present

## 2021-12-02 DIAGNOSIS — R609 Edema, unspecified: Secondary | ICD-10-CM | POA: Diagnosis not present

## 2021-12-02 DIAGNOSIS — E538 Deficiency of other specified B group vitamins: Secondary | ICD-10-CM | POA: Diagnosis not present

## 2021-12-13 DIAGNOSIS — G894 Chronic pain syndrome: Secondary | ICD-10-CM | POA: Diagnosis not present

## 2021-12-13 DIAGNOSIS — E7800 Pure hypercholesterolemia, unspecified: Secondary | ICD-10-CM | POA: Diagnosis not present

## 2021-12-13 DIAGNOSIS — D649 Anemia, unspecified: Secondary | ICD-10-CM | POA: Diagnosis not present

## 2021-12-13 DIAGNOSIS — I509 Heart failure, unspecified: Secondary | ICD-10-CM | POA: Diagnosis not present

## 2021-12-13 DIAGNOSIS — I878 Other specified disorders of veins: Secondary | ICD-10-CM | POA: Diagnosis not present

## 2021-12-13 DIAGNOSIS — I10 Essential (primary) hypertension: Secondary | ICD-10-CM | POA: Diagnosis not present

## 2021-12-13 DIAGNOSIS — Z23 Encounter for immunization: Secondary | ICD-10-CM | POA: Diagnosis not present

## 2022-02-27 ENCOUNTER — Telehealth: Payer: Self-pay | Admitting: Physical Medicine and Rehabilitation

## 2022-02-27 DIAGNOSIS — J45909 Unspecified asthma, uncomplicated: Secondary | ICD-10-CM | POA: Diagnosis not present

## 2022-02-27 DIAGNOSIS — E785 Hyperlipidemia, unspecified: Secondary | ICD-10-CM | POA: Diagnosis not present

## 2022-02-27 DIAGNOSIS — I10 Essential (primary) hypertension: Secondary | ICD-10-CM | POA: Diagnosis not present

## 2022-02-27 DIAGNOSIS — N189 Chronic kidney disease, unspecified: Secondary | ICD-10-CM | POA: Diagnosis not present

## 2022-02-27 NOTE — Telephone Encounter (Signed)
Patient states she need appt for her Knee please advise [phone]

## 2022-03-03 ENCOUNTER — Other Ambulatory Visit: Payer: Self-pay | Admitting: Physical Medicine and Rehabilitation

## 2022-03-03 DIAGNOSIS — M48062 Spinal stenosis, lumbar region with neurogenic claudication: Secondary | ICD-10-CM

## 2022-03-03 DIAGNOSIS — M5416 Radiculopathy, lumbar region: Secondary | ICD-10-CM

## 2022-03-03 NOTE — Telephone Encounter (Signed)
Spoke with patient and informed her that one of the orthopedic doctors would have to see her. She stated she is having the same type of back pain as before when she had her last injection. No new injury, accident or fall. She stated it is hard to tell how long the injection lasted because she was admitted into the hospital in July for colitis. Please advise

## 2022-03-07 DIAGNOSIS — Z8249 Family history of ischemic heart disease and other diseases of the circulatory system: Secondary | ICD-10-CM | POA: Diagnosis not present

## 2022-03-07 DIAGNOSIS — Z79891 Long term (current) use of opiate analgesic: Secondary | ICD-10-CM | POA: Diagnosis not present

## 2022-03-07 DIAGNOSIS — Z7951 Long term (current) use of inhaled steroids: Secondary | ICD-10-CM | POA: Diagnosis not present

## 2022-03-07 DIAGNOSIS — J45909 Unspecified asthma, uncomplicated: Secondary | ICD-10-CM | POA: Diagnosis not present

## 2022-03-07 DIAGNOSIS — K5900 Constipation, unspecified: Secondary | ICD-10-CM | POA: Diagnosis not present

## 2022-03-07 DIAGNOSIS — M1990 Unspecified osteoarthritis, unspecified site: Secondary | ICD-10-CM | POA: Diagnosis not present

## 2022-03-07 DIAGNOSIS — Z87891 Personal history of nicotine dependence: Secondary | ICD-10-CM | POA: Diagnosis not present

## 2022-03-07 DIAGNOSIS — I739 Peripheral vascular disease, unspecified: Secondary | ICD-10-CM | POA: Diagnosis not present

## 2022-03-07 DIAGNOSIS — E785 Hyperlipidemia, unspecified: Secondary | ICD-10-CM | POA: Diagnosis not present

## 2022-03-07 DIAGNOSIS — M545 Low back pain, unspecified: Secondary | ICD-10-CM | POA: Diagnosis not present

## 2022-03-07 DIAGNOSIS — I509 Heart failure, unspecified: Secondary | ICD-10-CM | POA: Diagnosis not present

## 2022-03-07 DIAGNOSIS — I110 Hypertensive heart disease with heart failure: Secondary | ICD-10-CM | POA: Diagnosis not present

## 2022-03-07 DIAGNOSIS — Z008 Encounter for other general examination: Secondary | ICD-10-CM | POA: Diagnosis not present

## 2022-03-08 NOTE — Telephone Encounter (Signed)
Pt returning call. Pt requesting callback.  °

## 2022-03-08 NOTE — Telephone Encounter (Signed)
Spoke with patient and scheduled for 04/11/22

## 2022-03-09 DIAGNOSIS — M48062 Spinal stenosis, lumbar region with neurogenic claudication: Secondary | ICD-10-CM | POA: Diagnosis not present

## 2022-03-09 DIAGNOSIS — D509 Iron deficiency anemia, unspecified: Secondary | ICD-10-CM | POA: Diagnosis not present

## 2022-03-09 DIAGNOSIS — G894 Chronic pain syndrome: Secondary | ICD-10-CM | POA: Diagnosis not present

## 2022-03-09 DIAGNOSIS — M25561 Pain in right knee: Secondary | ICD-10-CM | POA: Diagnosis not present

## 2022-03-09 DIAGNOSIS — I10 Essential (primary) hypertension: Secondary | ICD-10-CM | POA: Diagnosis not present

## 2022-03-09 DIAGNOSIS — E7800 Pure hypercholesterolemia, unspecified: Secondary | ICD-10-CM | POA: Diagnosis not present

## 2022-03-13 DIAGNOSIS — L570 Actinic keratosis: Secondary | ICD-10-CM | POA: Diagnosis not present

## 2022-03-13 DIAGNOSIS — D485 Neoplasm of uncertain behavior of skin: Secondary | ICD-10-CM | POA: Diagnosis not present

## 2022-03-13 DIAGNOSIS — C44319 Basal cell carcinoma of skin of other parts of face: Secondary | ICD-10-CM | POA: Diagnosis not present

## 2022-03-13 DIAGNOSIS — D2239 Melanocytic nevi of other parts of face: Secondary | ICD-10-CM | POA: Diagnosis not present

## 2022-03-13 DIAGNOSIS — L821 Other seborrheic keratosis: Secondary | ICD-10-CM | POA: Diagnosis not present

## 2022-03-13 DIAGNOSIS — D220 Melanocytic nevi of lip: Secondary | ICD-10-CM | POA: Diagnosis not present

## 2022-03-14 ENCOUNTER — Ambulatory Visit (INDEPENDENT_AMBULATORY_CARE_PROVIDER_SITE_OTHER): Payer: Medicare HMO

## 2022-03-14 ENCOUNTER — Ambulatory Visit (INDEPENDENT_AMBULATORY_CARE_PROVIDER_SITE_OTHER): Payer: Medicare HMO | Admitting: Physician Assistant

## 2022-03-14 ENCOUNTER — Encounter: Payer: Self-pay | Admitting: Physician Assistant

## 2022-03-14 DIAGNOSIS — M1711 Unilateral primary osteoarthritis, right knee: Secondary | ICD-10-CM

## 2022-03-14 DIAGNOSIS — M25561 Pain in right knee: Secondary | ICD-10-CM | POA: Diagnosis not present

## 2022-03-14 MED ORDER — METHYLPREDNISOLONE ACETATE 40 MG/ML IJ SUSP
40.0000 mg | INTRAMUSCULAR | Status: AC | PRN
  Administered 2022-03-14: 40 mg via INTRA_ARTICULAR

## 2022-03-14 MED ORDER — LIDOCAINE HCL 1 % IJ SOLN
3.0000 mL | INTRAMUSCULAR | Status: AC | PRN
  Administered 2022-03-14: 3 mL

## 2022-03-14 NOTE — Progress Notes (Signed)
Office Visit Note   Patient: Joanne Holmes           Date of Birth: 02-Jun-1945           MRN: [id number] Visit Date: 03/14/2022              Requested by: Caren Macadam, MD [place],  [place] [address] PCP: Caren Macadam, MD   Assessment & Plan: Visit Diagnoses:  1. 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  Follow-up with [place] in 2 weeks to see what type of results she had.  Knee was wrapped with an Ace bandage today.  She will remove this before returning to bed this evening.  Questions were encouraged and answered at length.  Follow-Up Instructions: Return in about 2 weeks (around 03/28/2022).   Orders:  Orders Placed This Encounter  Procedures   Large Joint Inj   XR Knee 1-2 Views Right   No orders of the defined types were placed in this encounter.     Procedures: Large Joint Inj: R knee on 03/14/2022 5:53 PM Indications: pain Details: 22 G 1.5 in needle, anterolateral approach  Arthrogram: No  Medications: 3 mL lidocaine 1 %; 40 mg methylPREDNISolone acetate 40 MG/ML Aspirate: 22 mL yellow Outcome: tolerated well, no immediate complications Procedure, treatment alternatives, risks and benefits explained, specific risks discussed. Consent was given by the patient. Immediately prior to procedure a time out was called to verify the correct patient, procedure, equipment, support staff and site/side marked as required. Patient was prepped and draped in the usual sterile fashion.       Clinical Data: No additional findings.   Subjective: Chief Complaint  Patient presents with   Right Knee - Pain    HPI Joanne Holmes is a 76 year old female who sees Dr. Ernestina Patches here in our office for her back.  Notes she takes tramadol and Norco for low back pain.  She comes in today with new complaint of right knee pain started just 10 days ago no known injury.  Pain with weightbearing.  She notes significant swelling in  the knee.  She has difficulty getting out of the car due to the knee pain.  Pain at night.  No catching locking.  Plus minus giving way.  Patient's nondiabetic.  Denies fevers or chills. Review of Systems See HPI  Objective: Vital Signs: There were no vitals taken for this visit.  Physical Exam General: Well-developed well-nourished female no acute distress mood and affect appropriate.  Psych alert and oriented x 3.  Ambulates without any assistive device. Ortho Exam Bilateral knees good range of motion both knees.  Right knee positive effusion no abnormal warmth or erythema.  Tenderness along medial joint line no instability valgus varus stressing right knee. Imaging: XR Knee 1-2 Views Right  Result Date: 03/14/2022 Right knee 4 views: Shows knee to be well located.  Mild patellofemoral changes.  Moderate narrowing medial joint line.  Lateral joint line well-preserved but chondrocalcinosis noted.  No acute fractures.  No other acute findings.    PMFS History: Patient Active Problem List   Diagnosis Date Noted   Colitis 10/11/2021   Acute colitis 10/10/2021   Gastroesophageal reflux disease 10/10/2021   Gout 10/10/2021   Class 1 obesity 10/10/2021   Hyperglycemia 10/10/2021   High serum parathyroid hormone (PTH) 06/20/2019   Hypertensive kidney disease with stage 3b chronic kidney disease ([facility] [place]) 05/22/2019   Hypoxia  12/22/2016   DOE (dyspnea on exertion) 12/22/2016   Spinal stenosis of lumbar region with neurogenic claudication 04/27/2016   Post laminectomy syndrome 04/27/2016   AKI (acute kidney injury) ([facility]) 05/11/2014   Essential hypertension 05/11/2014   Persistent vomiting 03/18/2012   Hypertensive urgency 03/18/2012   COPD (chronic obstructive pulmonary disease) ([facility]) 03/18/2012   Past Medical History:  Diagnosis Date   Allergy    Asthma    COPD (chronic obstructive pulmonary disease) (HCC)    Hypertension    Kidney stone    Neuromuscular disorder (HCC)    carpal  tunnel- left   Pneumonia    Spinal stenosis     Family History  Problem Relation Age of Onset   CAD Mother    Cancer Father        lymphosarcoma   Breast cancer Neg Hx     Past Surgical History:  Procedure Laterality Date   ABDOMINAL HYSTERECTOMY     BACK SURGERY  25+ yrs ago   lumbar laminectomy L5-S1   BLADDER SUSPENSION  ~10 yrs ago   CARPAL TUNNEL RELEASE  03/08/2012   Procedure: CARPAL TUNNEL RELEASE;  Surgeon: Cammie Sickle., MD;  Location: [facility];  Service: Orthopedics;  Laterality: Left;   SALPINGOOPHORECTOMY  ~8 yrs ago   TONSILLECTOMY     Social History   Occupational History   Not on file  Tobacco Use   Smoking status: Former    Packs/day: 2.00    Years: 20.00    Total pack years: 40.00    Types: Cigarettes    Quit date: 03/06/1981    Years since quitting: 41.0   Smokeless tobacco: Never  Vaping Use   Vaping Use: Never used  Substance and Sexual Activity   Alcohol use: No   Drug use: No   Sexual activity: Not Currently

## 2022-04-05 DIAGNOSIS — N1832 Chronic kidney disease, stage 3b: Secondary | ICD-10-CM | POA: Diagnosis not present

## 2022-04-06 ENCOUNTER — Encounter: Payer: Self-pay | Admitting: Physician Assistant

## 2022-04-06 ENCOUNTER — Ambulatory Visit: Payer: Medicare HMO | Admitting: Physician Assistant

## 2022-04-06 DIAGNOSIS — N184 Chronic kidney disease, stage 4 (severe): Secondary | ICD-10-CM | POA: Diagnosis not present

## 2022-04-06 DIAGNOSIS — E669 Obesity, unspecified: Secondary | ICD-10-CM | POA: Diagnosis not present

## 2022-04-06 DIAGNOSIS — I129 Hypertensive chronic kidney disease with stage 1 through stage 4 chronic kidney disease, or unspecified chronic kidney disease: Secondary | ICD-10-CM | POA: Diagnosis not present

## 2022-04-06 DIAGNOSIS — M1711 Unilateral primary osteoarthritis, right knee: Secondary | ICD-10-CM

## 2022-04-06 NOTE — Progress Notes (Signed)
HPI: Joanne Holmes comes in today for follow-up of her right knee status post cortisone injection 03/14/2022.  She states the injection helped completely for about 10 days and her pain is slowly came back.  Again radiographs showed moderate narrowing medial joint line and mild patellofemoral arthritic changes.  She really having no mechanical symptoms.  Her knee pain began 10 days prior to the injection on 03/14/2022.  Review of systems: See HPI otherwise   Physical exam: General well-developed well-nourished female who ambulates without any assistive device.  Right knee no abnormal warmth erythema or effusion.  Tenderness along medial joint line.  Impression: Right knee osteoarthritis  Plan: Patient would like to stay conservative and treatment.  Therefore recommend supplemental injection.  She has renal disease and is unable to take NSAIDs.  Also has colitis and gastric reflux disease.  Will work on gaining approval for supplemental injection and have her back once this is available.  Questions were encouraged and answered at length.

## 2022-04-07 ENCOUNTER — Telehealth: Payer: Self-pay

## 2022-04-07 NOTE — Telephone Encounter (Signed)
Please get auth for right knee gel inj-gil pt

## 2022-04-07 NOTE — Telephone Encounter (Signed)
VOB submitted for Orthovisc, right knee.

## 2022-04-11 ENCOUNTER — Ambulatory Visit: Payer: Self-pay

## 2022-04-11 ENCOUNTER — Ambulatory Visit: Payer: Medicare HMO | Admitting: Physical Medicine and Rehabilitation

## 2022-04-11 VITALS — BP 154/59 | HR 67

## 2022-04-11 DIAGNOSIS — M5416 Radiculopathy, lumbar region: Secondary | ICD-10-CM | POA: Diagnosis not present

## 2022-04-11 MED ORDER — METHYLPREDNISOLONE ACETATE 80 MG/ML IJ SUSP
80.0000 mg | Freq: Once | INTRAMUSCULAR | Status: AC
  Administered 2022-04-11: 80 mg

## 2022-04-11 NOTE — Progress Notes (Signed)
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

## 2022-04-17 NOTE — Procedures (Signed)
Lumbosacral Transforaminal Epidural Steroid Injection - Sub-Pedicular Approach with Fluoroscopic Guidance  Patient: Joanne Holmes      Date of Birth: 08/18/1945 MRN: [id number] PCP: Caren Macadam, MD      Visit Date: 04/11/2022   Universal Protocol:    Date/Time: 04/11/2022  Consent Given By: the patient  Position: PRONE  Additional Comments: Vital signs were monitored before and after the procedure. Patient was prepped and draped in the usual sterile fashion. The correct patient, procedure, and site was verified.   Injection Procedure Details:   Procedure diagnoses: Lumbar radiculopathy [M54.16]    Meds Administered:  Meds ordered this encounter  Medications   methylPREDNISolone acetate (DEPO-MEDROL) injection 80 mg    Laterality: Bilateral  Location/Site: L3  Needle:5.0 in., 22 ga.  Short bevel or Quincke spinal needle  Needle Placement: Transforaminal  Findings:    -Comments: Excellent flow of contrast along the nerve, nerve root and into the epidural space.  Procedure Details: After squaring off the end-plates to get a true AP view, the C-arm was positioned so that an oblique view of the foramen as noted above was visualized. The target area is just inferior to the "nose of the scotty dog" or sub pedicular. The soft tissues overlying this structure were infiltrated with 2-3 ml. of 1% Lidocaine without Epinephrine.  The spinal needle was inserted toward the target using a "trajectory" view along the fluoroscope beam.  Under AP and lateral visualization, the needle was advanced so it did not puncture dura and was located close the 6 O'Clock position of the pedical in AP tracterory. Biplanar projections were used to confirm position. Aspiration was confirmed to be negative for CSF and/or blood. A 1-2 ml. volume of Isovue-250 was injected and flow of contrast was noted at each level. Radiographs were obtained for documentation purposes.   After attaining the desired  flow of contrast documented above, a 0.5 to 1.0 ml test dose of 0.25% Marcaine was injected into each respective transforaminal space.  The patient was observed for 90 seconds post injection.  After no sensory deficits were reported, and normal lower extremity motor function was noted,   the above injectate was administered so that equal amounts of the injectate were placed at each foramen (level) into the transforaminal epidural space.   Additional Comments:  The patient tolerated the procedure well Dressing: 2 x 2 sterile gauze and Band-Aid    Post-procedure details: Patient was observed during the procedure. Post-procedure instructions were reviewed.  Patient left the clinic in stable condition.

## 2022-04-17 NOTE — Progress Notes (Signed)
Joanne Holmes - 77 y.o. female MRN [id number]  Date of birth: 23-Sep-1945  Office Visit Note: Visit Date: 04/11/2022 PCP: Joanne Macadam, MD Referred by: Joanne Macadam, MD  Subjective: Chief Complaint  Patient presents with   Lower Back - Pain   HPI:  Joanne Holmes is a 77 y.o. female who comes in today for planned repeat Bilateral L3-4  Lumbar Transforaminal epidural steroid injection with fluoroscopic guidance.  The patient has failed conservative care including home exercise, medications, time and activity modification.  This injection will be diagnostic and hopefully therapeutic.  Please see requesting physician notes for further details and justification. Patient received more than 50% pain relief from prior injection. Consider update MRI of the lumbar spine.  Referring: Joanne Pall, FNP   ROS Otherwise per HPI.  Assessment & Plan: Visit Diagnoses:    ICD-10-CM   1. Lumbar radiculopathy  M54.16 XR C-ARM NO REPORT    Epidural Steroid injection    methylPREDNISolone acetate (DEPO-MEDROL) injection 80 mg      Plan: No additional findings.   Meds & Orders:  Meds ordered this encounter  Medications   methylPREDNISolone acetate (DEPO-MEDROL) injection 80 mg    Orders Placed This Encounter  Procedures   XR C-ARM NO REPORT   Epidural Steroid injection    Follow-up: Return if symptoms worsen or fail to improve, for visit to requesting provider as needed.   Procedures: No procedures performed  Lumbosacral Transforaminal Epidural Steroid Injection - Sub-Pedicular Approach with Fluoroscopic Guidance  Patient: Joanne Holmes      Date of Birth: 1945-12-12 MRN: [id number] PCP: Joanne Macadam, MD      Visit Date: 04/11/2022   Universal Protocol:    Date/Time: 04/11/2022  Consent Given By: the patient  Position: PRONE  Additional Comments: Vital signs were monitored before and after the procedure. Patient was prepped and draped in the usual sterile  fashion. The correct patient, procedure, and site was verified.   Injection Procedure Details:   Procedure diagnoses: Lumbar radiculopathy [M54.16]    Meds Administered:  Meds ordered this encounter  Medications   methylPREDNISolone acetate (DEPO-MEDROL) injection 80 mg    Laterality: Bilateral  Location/Site: L3  Needle:5.0 in., 22 ga.  Short bevel or Quincke spinal needle  Needle Placement: Transforaminal  Findings:    -Comments: Excellent flow of contrast along the nerve, nerve root and into the epidural space.  Procedure Details: After squaring off the end-plates to get a true AP view, the C-arm was positioned so that an oblique view of the foramen as noted above was visualized. The target area is just inferior to the "nose of the scotty dog" or sub pedicular. The soft tissues overlying this structure were infiltrated with 2-3 ml. of 1% Lidocaine without Epinephrine.  The spinal needle was inserted toward the target using a "trajectory" view along the fluoroscope beam.  Under AP and lateral visualization, the needle was advanced so it did not puncture dura and was located close the 6 O'Clock position of the pedical in AP tracterory. Biplanar projections were used to confirm position. Aspiration was confirmed to be negative for CSF and/or blood. A 1-2 ml. volume of Isovue-250 was injected and flow of contrast was noted at each level. Radiographs were obtained for documentation purposes.   After attaining the desired flow of contrast documented above, a 0.5 to 1.0 ml test dose of 0.25% Marcaine was injected into each respective transforaminal space.  The patient was observed for 90 seconds post  injection.  After no sensory deficits were reported, and normal lower extremity motor function was noted,   the above injectate was administered so that equal amounts of the injectate were placed at each foramen (level) into the transforaminal epidural space.   Additional Comments:  The  patient tolerated the procedure well Dressing: 2 x 2 sterile gauze and Band-Aid    Post-procedure details: Patient was observed during the procedure. Post-procedure instructions were reviewed.  Patient left the clinic in stable condition.    Clinical History: Lumbar spine MRI dated 03/01/2016   Alignment: 4 mm anterolisthesis L3-4 with mild progression since  2013. 6.6 mm anterolisthesis L4-5 unchanged. Mild retrolisthesis  L1-2.     Vertebrae:  Negative for fracture or mass.  Normal bone marrow     Conus medullaris: Extends to the L1-2 level and appears normal.     Paraspinal and other soft tissues: No retroperitoneal mass or  adenopathy. Paraspinous muscles are symmetric with mild atrophy.     Disc levels:     L1-2: Mild retrolisthesis. Disc bulging and mild facet degeneration  without significant stenosis.     L2-3:  Mild disc and facet degeneration.  Mild spinal stenosis.     L3-4: Severe spinal stenosis which has progressed in the interval.  There is anterior listhesis of L3 on L4. Diffuse disc bulging.  Severe facet degeneration has progressed in the interval. There is  marked subarticular stenosis bilaterally as well as moderate  foraminal encroachment bilaterally.     L4-5: Severe spinal stenosis has progressed in the interval. Severe  facet degeneration. Grade 1 anterior listhesis unchanged.  Subarticular and foraminal encroachment bilaterally.     L5-S1: Severe disc degeneration with disc space narrowing and  endplate spurring. Prior laminectomy on the left. Diffuse endplate  scarring is present. There is soft tissue surrounding the left S1  nerve root which is unchanged most likely due to scar tissue. No  recurrent disc protrusion. Mild foraminal narrowing bilaterally due  to spurring.     Objective:  VS:  HT:    WT:   BMI:     BP:(!) 154/59  HR:67bpm  TEMP: ( )  RESP:  Physical Exam Vitals and nursing note reviewed.  Constitutional:       General: She is not in acute distress.    Appearance: Normal appearance. She is not ill-appearing.  HENT:     Head: Normocephalic and atraumatic.     Right Ear: External ear normal.     Left Ear: External ear normal.  Eyes:     Extraocular Movements: Extraocular movements intact.  Cardiovascular:     Rate and Rhythm: Normal rate.     Pulses: Normal pulses.  Pulmonary:     Effort: Pulmonary effort is normal. No respiratory distress.  Abdominal:     General: There is no distension.     Palpations: Abdomen is soft.  Musculoskeletal:        General: Tenderness present.     Cervical back: Neck supple.     Right lower leg: No edema.     Left lower leg: No edema.     Comments: Patient has good distal strength with no pain over the greater trochanters.  No clonus or focal weakness.  Skin:    Findings: No erythema, lesion or rash.  Neurological:     General: No focal deficit present.     Mental Status: She is alert and oriented to person, place, and time.     Sensory:  No sensory deficit.     Motor: No weakness or abnormal muscle tone.     Coordination: Coordination normal.  Psychiatric:        Mood and Affect: Mood normal.        Behavior: Behavior normal.      Imaging: No results found.

## 2022-04-20 ENCOUNTER — Telehealth: Payer: Self-pay | Admitting: *Deleted

## 2022-04-20 NOTE — Progress Notes (Signed)
  Care Coordination   Note   04/20/2022 Name: Joanne Holmes MRN: [id number] DOB: 1945-11-29  Joanne Holmes is a 77 y.o. year old female who sees Caren Macadam, MD for primary care. I reached out to Beatrice Lecher by phone today to offer care coordination services.  [name] was given information about Care Coordination services today including:   The Care Coordination services include support from the care team which includes your Nurse Coordinator, Clinical Social Worker, or Pharmacist.  The Care Coordination team is here to help remove barriers to the health concerns and goals most important to you. Care Coordination services are voluntary, and the patient may decline or stop services at any time by request to their care team member.   Care Coordination Consent Status: Patient agreed to services and verbal consent obtained.   Follow up plan:  Telephone appointment with care coordination team member scheduled for:  05/02/22  Encounter Outcome:  Pt. Scheduled  [place]  Direct Dial: [phone]

## 2022-04-27 ENCOUNTER — Telehealth: Payer: Self-pay

## 2022-04-27 DIAGNOSIS — M1711 Unilateral primary osteoarthritis, right knee: Secondary | ICD-10-CM

## 2022-04-27 NOTE — Telephone Encounter (Signed)
Talked with patient and advised her that she is approved for gel injections.  Stated that she is doing better and will call back to schedule when she is ready to proceed.   Patient will need 3 appts.when she calls to schedule.

## 2022-05-02 ENCOUNTER — Ambulatory Visit: Payer: Self-pay

## 2022-05-02 NOTE — Patient Instructions (Signed)
Visit Information  Thank you for taking time to visit with me today. Please don't hesitate to contact me if I can be of assistance to you.   Following are the goals we discussed today:   Goals Addressed             This Visit's Progress    COMPLETED: I want to knoWhat foods I can eat with CKD       Care Coordination Interventions: Reviewed medications with patient and discussed Will give the patient information about diet in My chart for CKD  Care Coordination Interventions: Active listening / Reflection utilized  Emotional Support Provided Problem [place] strategies reviewed Discussed/.Educated Care Coordination Program 2.   Discussed/.Educated Annual Wellness Visit 3.   Discussed/.Educated Social Determinates of Health 4.   Please inform PCP if services needed in the future          If you are experiencing a Mental Health or [place] or need someone to talk to, please call 1-800-273-TALK (toll free, 24 hour hotline)  Patient verbalizes understanding of instructions and care plan provided today and agrees to view in [place]. Active MyChart status and patient understanding of how to access instructions and care plan via MyChart confirmed with patient.     No follow up needed  Lazaro Arms RN, BSN, [place] [facility]   Phone: [phone]

## 2022-05-02 NOTE — Patient Outreach (Signed)
  Care Coordination   Initial Visit Note   05/02/2022 Name: SYMONE CORNMAN MRN: [id number] DOB: 13-Oct-1945  JORDAN CARAVEO is a 77 y.o. year old female who sees Caren Macadam, MD for primary care. I spoke with  Beatrice Lecher by phone today.  What matters to the patients health and wellness today?  Mrs. Noh lives with her husband I the home ad states that she is fairly healthy.  She has no complaints today but would like some information about foods she can eat with CKD.    Goals Addressed             This Visit's Progress    COMPLETED: I want to knoWhat foods I can eat with CKD       Care Coordination Interventions: Reviewed medications with patient and discussed Will give the patient information about diet in My chart for CKD  Care Coordination Interventions: Active listening / Reflection utilized  Emotional Support Provided Problem [place] strategies reviewed Discussed/.Educated Care Coordination Program 2.   Discussed/.Educated Annual Wellness Visit 3.   Discussed/.Educated Social Determinates of Health 4.   Please inform PCP if services needed in the future          SDOH assessments and interventions completed:  Yes  SDOH Interventions Today    Flowsheet Row Most Recent Value  SDOH Interventions   Food Insecurity Interventions Intervention Not Indicated  Transportation Interventions Intervention Not Indicated        Care Coordination Interventions:  Yes, provided  Interventions Today    Flowsheet Row Most Recent Value  General Interventions   General Interventions Discussed/Reviewed General Interventions Discussed  Education Interventions   Education Provided Provided Web-based Education  Nutrition Interventions   Nutrition Discussed/Reviewed Nutrition Discussed        Follow up plan: No further intervention required.   Encounter Outcome:  Pt. Visit Completed   Lazaro Arms RN, BSN, [place] [facility]   Phone: [phone]

## 2022-05-08 DIAGNOSIS — C44311 Basal cell carcinoma of skin of nose: Secondary | ICD-10-CM | POA: Diagnosis not present

## 2022-05-11 DIAGNOSIS — J449 Chronic obstructive pulmonary disease, unspecified: Secondary | ICD-10-CM | POA: Diagnosis not present

## 2022-05-11 DIAGNOSIS — E7800 Pure hypercholesterolemia, unspecified: Secondary | ICD-10-CM | POA: Diagnosis not present

## 2022-05-11 DIAGNOSIS — I10 Essential (primary) hypertension: Secondary | ICD-10-CM | POA: Diagnosis not present

## 2022-05-11 DIAGNOSIS — N1832 Chronic kidney disease, stage 3b: Secondary | ICD-10-CM | POA: Diagnosis not present

## 2022-05-11 DIAGNOSIS — J45909 Unspecified asthma, uncomplicated: Secondary | ICD-10-CM | POA: Diagnosis not present

## 2022-05-11 DIAGNOSIS — K219 Gastro-esophageal reflux disease without esophagitis: Secondary | ICD-10-CM | POA: Diagnosis not present

## 2022-06-01 ENCOUNTER — Encounter: Payer: Self-pay | Admitting: Radiology

## 2022-07-19 DIAGNOSIS — Z780 Asymptomatic menopausal state: Secondary | ICD-10-CM | POA: Diagnosis not present

## 2022-07-19 DIAGNOSIS — Z0000 Encounter for general adult medical examination without abnormal findings: Secondary | ICD-10-CM | POA: Diagnosis not present

## 2022-07-19 DIAGNOSIS — I739 Peripheral vascular disease, unspecified: Secondary | ICD-10-CM | POA: Diagnosis not present

## 2022-07-19 DIAGNOSIS — M48062 Spinal stenosis, lumbar region with neurogenic claudication: Secondary | ICD-10-CM | POA: Diagnosis not present

## 2022-07-19 DIAGNOSIS — D649 Anemia, unspecified: Secondary | ICD-10-CM | POA: Diagnosis not present

## 2022-07-19 DIAGNOSIS — J301 Allergic rhinitis due to pollen: Secondary | ICD-10-CM | POA: Diagnosis not present

## 2022-07-19 DIAGNOSIS — N184 Chronic kidney disease, stage 4 (severe): Secondary | ICD-10-CM | POA: Diagnosis not present

## 2022-07-19 DIAGNOSIS — I700 Atherosclerosis of aorta: Secondary | ICD-10-CM | POA: Diagnosis not present

## 2022-07-19 DIAGNOSIS — G894 Chronic pain syndrome: Secondary | ICD-10-CM | POA: Diagnosis not present

## 2022-07-19 DIAGNOSIS — J449 Chronic obstructive pulmonary disease, unspecified: Secondary | ICD-10-CM | POA: Diagnosis not present

## 2022-07-19 DIAGNOSIS — I10 Essential (primary) hypertension: Secondary | ICD-10-CM | POA: Diagnosis not present

## 2022-07-19 DIAGNOSIS — E7800 Pure hypercholesterolemia, unspecified: Secondary | ICD-10-CM | POA: Diagnosis not present

## 2022-07-20 ENCOUNTER — Other Ambulatory Visit: Payer: Self-pay | Admitting: Family Medicine

## 2022-07-20 DIAGNOSIS — I10 Essential (primary) hypertension: Secondary | ICD-10-CM | POA: Diagnosis not present

## 2022-07-20 DIAGNOSIS — Z780 Asymptomatic menopausal state: Secondary | ICD-10-CM

## 2022-07-20 DIAGNOSIS — Z1231 Encounter for screening mammogram for malignant neoplasm of breast: Secondary | ICD-10-CM

## 2022-08-04 ENCOUNTER — Telehealth: Payer: Self-pay | Admitting: Orthopedic Surgery

## 2022-08-04 DIAGNOSIS — M5416 Radiculopathy, lumbar region: Secondary | ICD-10-CM

## 2022-08-04 NOTE — Telephone Encounter (Signed)
Joanne Holmes is calling to make a follow up appointment to see Dr. Alvester Morin for her back.  Has had ESI in the past.  Please call her at [phone].

## 2022-08-08 NOTE — Telephone Encounter (Signed)
Spoke with patient and she is requesting an injection. She stated the last injection lasted about 3 months and she had over 75% relief. No new falls, accidents or injuries. Please advise

## 2022-08-08 NOTE — Addendum Note (Signed)
Addended by: Sharlet Salina on: 08/08/2022 04:31 PM   Modules accepted: Orders

## 2022-08-08 NOTE — Telephone Encounter (Signed)
Order for repeat injection placed. Left voicemail to return call to inform us if she would like a MRI

## 2022-08-15 ENCOUNTER — Telehealth: Payer: Self-pay | Admitting: Physical Medicine and Rehabilitation

## 2022-08-15 NOTE — Telephone Encounter (Signed)
Patient called needing to schedule an appointment with Dr. Alvester Morin for her back. The number to contact patient is [phone]

## 2022-08-17 ENCOUNTER — Telehealth: Payer: Self-pay | Admitting: Physical Medicine and Rehabilitation

## 2022-08-17 NOTE — Telephone Encounter (Signed)
Patient asking to get an injection in her lower back. Please call

## 2022-08-18 ENCOUNTER — Telehealth: Payer: Self-pay | Admitting: Physical Medicine and Rehabilitation

## 2022-08-18 NOTE — Telephone Encounter (Signed)
LVM to return call to schedule injection 

## 2022-08-18 NOTE — Telephone Encounter (Signed)
Spoke with patient and scheduled injection for 08/28/22. Patient aware driver needed 

## 2022-08-18 NOTE — Telephone Encounter (Signed)
PT RETURNED CALL TO BRITTANY J. PT NEED ANOTHER INJECTION APPT SAND REFERRAL FOR MRI. PT PHONE NUMBER IS [phone].

## 2022-08-28 ENCOUNTER — Ambulatory Visit: Payer: Medicare HMO | Admitting: Physical Medicine and Rehabilitation

## 2022-08-28 ENCOUNTER — Other Ambulatory Visit: Payer: Self-pay

## 2022-08-28 VITALS — BP 179/75 | HR 80

## 2022-08-28 DIAGNOSIS — F1190 Opioid use, unspecified, uncomplicated: Secondary | ICD-10-CM | POA: Diagnosis not present

## 2022-08-28 DIAGNOSIS — G894 Chronic pain syndrome: Secondary | ICD-10-CM

## 2022-08-28 DIAGNOSIS — M961 Postlaminectomy syndrome, not elsewhere classified: Secondary | ICD-10-CM | POA: Diagnosis not present

## 2022-08-28 DIAGNOSIS — M48062 Spinal stenosis, lumbar region with neurogenic claudication: Secondary | ICD-10-CM

## 2022-08-28 DIAGNOSIS — M5416 Radiculopathy, lumbar region: Secondary | ICD-10-CM | POA: Diagnosis not present

## 2022-08-28 MED ORDER — METHYLPREDNISOLONE ACETATE 80 MG/ML IJ SUSP
80.0000 mg | Freq: Once | INTRAMUSCULAR | Status: AC
Start: 2022-08-28 — End: 2022-08-28
  Administered 2022-08-28: 80 mg

## 2022-08-28 MED ORDER — DIAZEPAM 5 MG PO TABS: ORAL_TABLET | ORAL | 0 refills | Status: AC

## 2022-08-28 NOTE — Patient Instructions (Signed)

## 2022-08-28 NOTE — Progress Notes (Unsigned)
Functional Pain Scale - descriptive words and definitions  Uncomfortable (3)  Pain is present but can complete all ADL's/sleep is slightly affected and passive distraction only gives marginal relief. Mild range order  Average Pain 4   +Driver, -BT, -Dye Allergies.  Lower back pain on both sides, mainly on the left, with no radiation in legs

## 2022-08-30 ENCOUNTER — Encounter: Payer: Self-pay | Admitting: Physical Medicine and Rehabilitation

## 2022-08-30 NOTE — Progress Notes (Signed)
Joanne Holmes - 77 y.o. female MRN [id number]  Date of birth: 1945/08/19  Office Visit Note: Visit Date: 08/28/2022 PCP: Aliene Beams, MD Referred by: Aliene Beams, MD  Subjective: Chief Complaint  Patient presents with   Lower Back - Pain   HPI: Joanne Holmes is a 77 y.o. female who comes in today for evaluation and management for chronic, recalcitrant and severe pain in the lower back with referral into both legs.  Her pain is more left than right at this point and really not all the way down the legs but into the hips and posterior leg.  Some thigh pain.  Nothing really past the knee.  Her history is such that she has a lumbar MRI from 2018 showing showing multifactorial stenosis at L3-for which has progressed as well as at L4-5. The L3-4 is fairly severe and the L4-5 is severe. She does have a listhesis of L4 on L5. She has prior lumbar surgery at L5-S1 with degenerative disc height loss and some foraminal narrowing and scar tissue around the left S1 nerve root. Her symptoms are more consistent with a combination of neurogenic claudication from the stenosis and facet mediated low back pain.  We originally saw her through treating her husband.  She was seeing Dr. Newell Coral at [facility] and had the prior laminectomy and was having injections performed by him which were interlaminar injections below her fusion at times on the 1 side where there was no laminectomy performed.  Those injections just really seems to help a lot and Dr. Newell Coral did retire.  She has been getting decent relief with's with interlaminar injection again at first and then more lately with the transforaminal injection at L3.  She gets between 2 to 3 months of pain control.  Unfortunately though she is still on chronic pain medications managed by her primary care physician Dr. Tracie Harrier.  The patient takes 2, 50 mg tramadol as well as 1, 5 milligram hydrocodone 3 times per day.  She reports  at times that she does not necessarily do that 3 times a day.  She does consistently get her medications as I did review the PDMP.  It does not appear she is increased this medication recently and this again is managed by her PCP and she does get some benefit from this.  Obviously is not controlling her pain to the point where the epidural injections are not needed.  She continues to try to stay active she has had physical therapy in the past.  She has had some falls but nothing severe she had a recent fall with some bruising of her right arm.  She does feel weak at times but no focal weakness or foot drop.   I spent more than 30 minutes speaking face-to-face with the patient with 50% of the time in counseling and discussing coordination of care.        Review of Systems  Musculoskeletal:  Positive for back pain, falls and joint pain.  Neurological:  Positive for tingling.  All other systems reviewed and are negative.  Otherwise per HPI.  Assessment & Plan: Visit Diagnoses:    ICD-10-CM   1. Lumbar radiculopathy  M54.16 XR C-ARM NO REPORT    Epidural Steroid injection    methylPREDNISolone acetate (DEPO-MEDROL) injection 80 mg    MR LUMBAR SPINE WO CONTRAST    2. Spinal stenosis of lumbar region with neurogenic claudication  M48.062 MR LUMBAR SPINE WO CONTRAST  3. Post laminectomy syndrome  M96.1 MR LUMBAR SPINE WO CONTRAST    4. Chronic pain syndrome  G89.4     5. Opioid use, unspecified, uncomplicated  F11.90        Plan: Findings:  Chronic, recalcitrant and severe at times multifactorial low back pain bilateral hip pain left more than right.  History of lumbar stenosis at 2 levels with MRI from 2019.  Prior lumbar laminectomy surgery at L5-S1.  Pain also consistent with facet mediated low back pain.  No real history of facet blocks or medial branch blocks.  I think the neck step is MRI of the lumbar spine just to see if anything is changed recently she has had some falls.  MRI  was from 2018.  May provide a target otherwise I want to repeat her epidural injection today to the severe amount of pain and discomfort that she is having overall.  Pain is worse with ambulating better at rest.  Consideration should be given to medial branch blocks.  Her pain is bad enough to continue with opioid management through her primary care physician.    Meds & Orders:  Meds ordered this encounter  Medications   methylPREDNISolone acetate (DEPO-MEDROL) injection 80 mg   diazepam (VALIUM) 5 MG tablet    Sig: Take 1 by mouth 1 hour  pre-procedure with very light food. May bring 2nd tablet to appointment.    Dispense:  2 tablet    Refill:  0    Orders Placed This Encounter  Procedures   XR C-ARM NO REPORT   MR LUMBAR SPINE WO CONTRAST   Epidural Steroid injection    Follow-up: Return for MRI review after completion.   Procedures: No procedures performed  Lumbosacral Transforaminal Epidural Steroid Injection - Sub-Pedicular Approach with Fluoroscopic Guidance  Patient: Joanne Holmes      Date of Birth: 05/14/1945 MRN: [id number] PCP: Aliene Beams, MD      Visit Date: 08/28/2022   Universal Protocol:    Date/Time: 08/28/2022  Consent Given By: the patient  Position: PRONE  Additional Comments: Vital signs were monitored before and after the procedure. Patient was prepped and draped in the usual sterile fashion. The correct patient, procedure, and site was verified.   Injection Procedure Details:   Procedure diagnoses: Lumbar radiculopathy [M54.16]    Meds Administered:  Meds ordered this encounter  Medications   methylPREDNISolone acetate (DEPO-MEDROL) injection 80 mg   diazepam (VALIUM) 5 MG tablet    Sig: Take 1 by mouth 1 hour  pre-procedure with very light food. May bring 2nd tablet to appointment.    Dispense:  2 tablet    Refill:  0    Laterality: Bilateral  Location/Site: L3  Needle:5.0 in., 22 ga.  Short bevel or Quincke spinal  needle  Needle Placement: Transforaminal  Findings:    -Comments: Excellent flow of contrast along the nerve, nerve root and into the epidural space.  Procedure Details: After squaring off the end-plates to get a true AP view, the C-arm was positioned so that an oblique view of the foramen as noted above was visualized. The target area is just inferior to the "nose of the scotty dog" or sub pedicular. The soft tissues overlying this structure were infiltrated with 2-3 ml. of 1% Lidocaine without Epinephrine.  The spinal needle was inserted toward the target using a "trajectory" view along the fluoroscope beam.  Under AP and lateral visualization, the needle was advanced so it did not puncture dura and  was located close the 6 O'Clock position of the pedical in AP tracterory. Biplanar projections were used to confirm position. Aspiration was confirmed to be negative for CSF and/or blood. A 1-2 ml. volume of Isovue-250 was injected and flow of contrast was noted at each level. Radiographs were obtained for documentation purposes.   After attaining the desired flow of contrast documented above, a 0.5 to 1.0 ml test dose of 0.25% Marcaine was injected into each respective transforaminal space.  The patient was observed for 90 seconds post injection.  After no sensory deficits were reported, and normal lower extremity motor function was noted,   the above injectate was administered so that equal amounts of the injectate were placed at each foramen (level) into the transforaminal epidural space.   Additional Comments:  No complications occurred Dressing: 2 x 2 sterile gauze and Band-Aid    Post-procedure details: Patient was observed during the procedure. Post-procedure instructions were reviewed.  Patient left the clinic in stable condition.    Clinical History: Lumbar spine MRI dated 03/01/2016   Alignment: 4 mm anterolisthesis L3-4 with mild progression since  2013. 6.6 mm anterolisthesis  L4-5 unchanged. Mild retrolisthesis  L1-2.     Vertebrae:  Negative for fracture or mass.  Normal bone marrow     Conus medullaris: Extends to the L1-2 level and appears normal.     Paraspinal and other soft tissues: No retroperitoneal mass or  adenopathy. Paraspinous muscles are symmetric with mild atrophy.     Disc levels:     L1-2: Mild retrolisthesis. Disc bulging and mild facet degeneration  without significant stenosis.     L2-3:  Mild disc and facet degeneration.  Mild spinal stenosis.     L3-4: Severe spinal stenosis which has progressed in the interval.  There is anterior listhesis of L3 on L4. Diffuse disc bulging.  Severe facet degeneration has progressed in the interval. There is  marked subarticular stenosis bilaterally as well as moderate  foraminal encroachment bilaterally.     L4-5: Severe spinal stenosis has progressed in the interval. Severe  facet degeneration. Grade 1 anterior listhesis unchanged.  Subarticular and foraminal encroachment bilaterally.     L5-S1: Severe disc degeneration with disc space narrowing and  endplate spurring. Prior laminectomy on the left. Diffuse endplate  scarring is present. There is soft tissue surrounding the left S1  nerve root which is unchanged most likely due to scar tissue. No  recurrent disc protrusion. Mild foraminal narrowing bilaterally due  to spurring.   She reports that she quit smoking about 41 years ago. Her smoking use included cigarettes. She has a 40.00 pack-year smoking history. She has never used smokeless tobacco. No results for input(s): "HGBA1C", "LABURIC" in the last 8760 hours.  Objective:  VS:  HT:    WT:   BMI:     BP:(!) 179/75  HR:80bpm  TEMP: ( )  RESP:  Physical Exam Vitals and nursing note reviewed.  Constitutional:      General: She is not in acute distress.    Appearance: Normal appearance. She is obese. She is not ill-appearing.  HENT:     Head: Normocephalic and atraumatic.      Right Ear: External ear normal.     Left Ear: External ear normal.  Eyes:     Extraocular Movements: Extraocular movements intact.  Cardiovascular:     Rate and Rhythm: Normal rate.     Pulses: Normal pulses.  Pulmonary:     Effort: Pulmonary effort  is normal. No respiratory distress.  Abdominal:     General: There is no distension.     Palpations: Abdomen is soft.  Musculoskeletal:        General: Tenderness present.     Cervical back: Neck supple.     Right lower leg: No edema.     Left lower leg: No edema.     Comments: Patient has good distal strength with no pain over the greater trochanters.  No clonus or focal weakness.  No groin pain with hip rotation.  Pain with extension of the lumbar spine.  Skin:    Findings: No erythema, lesion or rash.  Neurological:     General: No focal deficit present.     Mental Status: She is alert and oriented to person, place, and time.     Sensory: No sensory deficit.     Motor: No weakness or abnormal muscle tone.     Coordination: Coordination normal.  Psychiatric:        Mood and Affect: Mood normal.        Behavior: Behavior normal.     Ortho Exam  Imaging: No results found.  Past Medical/Family/Surgical/Social History: Medications & Allergies reviewed per EMR, new medications updated. Patient Active Problem List   Diagnosis Date Noted   Colitis 10/11/2021   Acute colitis 10/10/2021   Gastroesophageal reflux disease 10/10/2021   Gout 10/10/2021   Class 1 obesity 10/10/2021   Hyperglycemia 10/10/2021   High serum parathyroid hormone (PTH) 06/20/2019   Hypertensive kidney disease with stage 3b chronic kidney disease (HCC) 05/22/2019   Hypoxia 12/22/2016   DOE (dyspnea on exertion) 12/22/2016   Spinal stenosis of lumbar region with neurogenic claudication 04/27/2016   Post laminectomy syndrome 04/27/2016   AKI (acute kidney injury) (HCC) 05/11/2014   Essential hypertension 05/11/2014   Persistent vomiting 03/18/2012    Hypertensive urgency 03/18/2012   COPD (chronic obstructive pulmonary disease) (HCC) 03/18/2012   Past Medical History:  Diagnosis Date   Allergy    Asthma    COPD (chronic obstructive pulmonary disease) (HCC)    Hypertension    Kidney stone    Neuromuscular disorder (HCC)    carpal tunnel- left   Pneumonia    Spinal stenosis    Family History  Problem Relation Age of Onset   CAD Mother    Cancer Father        lymphosarcoma   Breast cancer Neg Hx    Past Surgical History:  Procedure Laterality Date   ABDOMINAL HYSTERECTOMY     BACK SURGERY  25+ yrs ago   lumbar laminectomy L5-S1   BLADDER SUSPENSION  ~10 yrs ago   CARPAL TUNNEL RELEASE  03/08/2012   Procedure: CARPAL TUNNEL RELEASE;  Surgeon: Wyn Forster., MD;  Location: [facility];  Service: Orthopedics;  Laterality: Left;   SALPINGOOPHORECTOMY  ~8 yrs ago   TONSILLECTOMY     Social History   Occupational History   Not on file  Tobacco Use   Smoking status: Former    Packs/day: 2.00    Years: 20.00    Additional pack years: 0.00    Total pack years: 40.00    Types: Cigarettes    Quit date: 03/06/1981    Years since quitting: 41.5   Smokeless tobacco: Never  Vaping Use   Vaping Use: Never used  Substance and Sexual Activity   Alcohol use: No   Drug use: No   Sexual activity: Not Currently

## 2022-08-30 NOTE — Procedures (Signed)
Lumbosacral Transforaminal Epidural Steroid Injection - Sub-Pedicular Approach with Fluoroscopic Guidance  Patient: Joanne Holmes      Date of Birth: 09/23/1945 MRN: [id number] PCP: Aliene Beams, MD      Visit Date: 08/28/2022   Universal Protocol:    Date/Time: 08/28/2022  Consent Given By: the patient  Position: PRONE  Additional Comments: Vital signs were monitored before and after the procedure. Patient was prepped and draped in the usual sterile fashion. The correct patient, procedure, and site was verified.   Injection Procedure Details:   Procedure diagnoses: Lumbar radiculopathy [M54.16]    Meds Administered:  Meds ordered this encounter  Medications   methylPREDNISolone acetate (DEPO-MEDROL) injection 80 mg   diazepam (VALIUM) 5 MG tablet    Sig: Take 1 by mouth 1 hour  pre-procedure with very light food. May bring 2nd tablet to appointment.    Dispense:  2 tablet    Refill:  0    Laterality: Bilateral  Location/Site: L3  Needle:5.0 in., 22 ga.  Short bevel or Quincke spinal needle  Needle Placement: Transforaminal  Findings:    -Comments: Excellent flow of contrast along the nerve, nerve root and into the epidural space.  Procedure Details: After squaring off the end-plates to get a true AP view, the C-arm was positioned so that an oblique view of the foramen as noted above was visualized. The target area is just inferior to the "nose of the scotty dog" or sub pedicular. The soft tissues overlying this structure were infiltrated with 2-3 ml. of 1% Lidocaine without Epinephrine.  The spinal needle was inserted toward the target using a "trajectory" view along the fluoroscope beam.  Under AP and lateral visualization, the needle was advanced so it did not puncture dura and was located close the 6 O'Clock position of the pedical in AP tracterory. Biplanar projections were used to confirm position. Aspiration was confirmed to be negative for CSF and/or  blood. A 1-2 ml. volume of Isovue-250 was injected and flow of contrast was noted at each level. Radiographs were obtained for documentation purposes.   After attaining the desired flow of contrast documented above, a 0.5 to 1.0 ml test dose of 0.25% Marcaine was injected into each respective transforaminal space.  The patient was observed for 90 seconds post injection.  After no sensory deficits were reported, and normal lower extremity motor function was noted,   the above injectate was administered so that equal amounts of the injectate were placed at each foramen (level) into the transforaminal epidural space.   Additional Comments:  No complications occurred Dressing: 2 x 2 sterile gauze and Band-Aid    Post-procedure details: Patient was observed during the procedure. Post-procedure instructions were reviewed.  Patient left the clinic in stable condition.

## 2022-09-01 ENCOUNTER — Encounter: Payer: Self-pay | Admitting: Physical Medicine and Rehabilitation

## 2022-09-10 ENCOUNTER — Ambulatory Visit
Admission: RE | Admit: 2022-09-10 | Discharge: 2022-09-10 | Disposition: A | Discharge status: Home / Self Care | Payer: Medicare HMO | Source: Ambulatory Visit | Attending: Physical Medicine and Rehabilitation | Admitting: Physical Medicine and Rehabilitation

## 2022-09-10 DIAGNOSIS — M47816 Spondylosis without myelopathy or radiculopathy, lumbar region: Secondary | ICD-10-CM | POA: Diagnosis not present

## 2022-09-10 DIAGNOSIS — M5126 Other intervertebral disc displacement, lumbar region: Secondary | ICD-10-CM | POA: Diagnosis not present

## 2022-09-11 ENCOUNTER — Ambulatory Visit: Payer: Medicare HMO

## 2022-09-13 ENCOUNTER — Ambulatory Visit
Admission: RE | Admit: 2022-09-13 | Discharge: 2022-09-13 | Disposition: A | Discharge status: Home / Self Care | Payer: Medicare HMO | Source: Ambulatory Visit | Attending: Family Medicine | Admitting: Family Medicine

## 2022-09-13 DIAGNOSIS — Z1231 Encounter for screening mammogram for malignant neoplasm of breast: Secondary | ICD-10-CM

## 2022-09-18 ENCOUNTER — Telehealth: Payer: Self-pay | Admitting: Physical Medicine and Rehabilitation

## 2022-09-18 NOTE — Telephone Encounter (Signed)
Let her know that her MRI is still showing severe narrowing of the spine at L3-4 and L4-5. Was already severe in 2017 last MRI. From injection standpoint could try L4 at some point - have been doing L3. Medication spreads.

## 2022-09-22 NOTE — Telephone Encounter (Signed)
Spoke with patient and gave her results of MRI. She states the last injection is still working. Advised patient to call in once she starts hurting again

## 2022-10-03 DIAGNOSIS — N184 Chronic kidney disease, stage 4 (severe): Secondary | ICD-10-CM | POA: Diagnosis not present

## 2022-11-13 ENCOUNTER — Telehealth: Payer: Self-pay | Admitting: Physical Medicine and Rehabilitation

## 2022-11-13 DIAGNOSIS — M5416 Radiculopathy, lumbar region: Secondary | ICD-10-CM

## 2022-11-13 NOTE — Telephone Encounter (Signed)
Pt called requesting a call to set an appt for back injection. Please call pt at [phone]

## 2022-11-14 NOTE — Telephone Encounter (Signed)
LVM to return call to clinic.

## 2022-11-15 NOTE — Telephone Encounter (Signed)
Spoke with patient and she is requesting another injection. She stated the injection from 08/28/22 lasted about a month and she got about 75% relief from it. Her pain level right now is  6. No new falls, accidents or injuries. Please advise

## 2022-11-15 NOTE — Addendum Note (Signed)
Addended by: Sharlet Salina on: 11/15/2022 11:21 AM   Modules accepted: Orders

## 2022-11-15 NOTE — Telephone Encounter (Signed)
Order for bilat L4 tf esi placed

## 2022-12-05 ENCOUNTER — Other Ambulatory Visit: Payer: Self-pay

## 2022-12-05 ENCOUNTER — Ambulatory Visit: Payer: Medicare HMO | Admitting: Physical Medicine and Rehabilitation

## 2022-12-05 VITALS — BP 165/68 | HR 69

## 2022-12-05 DIAGNOSIS — M5416 Radiculopathy, lumbar region: Secondary | ICD-10-CM | POA: Diagnosis not present

## 2022-12-05 MED ORDER — METHYLPREDNISOLONE ACETATE 80 MG/ML IJ SUSP
80.0000 mg | Freq: Once | INTRAMUSCULAR | Status: AC
  Administered 2022-12-05: 80 mg

## 2022-12-05 NOTE — Progress Notes (Signed)
Functional Pain Scale - descriptive words and definitions  Distracting (5)    Aware of pain/able to complete some ADL's but limited by pain/sleep is affected and active distractions are only slightly useful. Moderate range order  Average Pain 6   +Driver, -BT, -Dye Allergies.  Lower back pain on both sides with no radiation in the legs

## 2022-12-05 NOTE — Patient Instructions (Signed)

## 2022-12-11 NOTE — Progress Notes (Signed)
Joanne Holmes - 77 y.o. female MRN [id number]  Date of birth: 1945/04/27  Office Visit Note: Visit Date: 12/05/2022 PCP: Aliene Beams, MD Referred by: Aliene Beams, MD  Subjective: Chief Complaint  Patient presents with   Lower Back - Pain   HPI:  Joanne Holmes is a 77 y.o. female who comes in today at the request of Ellin Goodie, FNP for planned Bilateral L4-5 Lumbar Transforaminal epidural steroid injection with fluoroscopic guidance.  The patient has failed conservative care including home exercise, medications, time and activity modification.  This injection will be diagnostic and hopefully therapeutic.  Please see requesting physician notes for further details and justification.   ROS Otherwise per HPI.  Assessment & Plan: Visit Diagnoses:    ICD-10-CM   1. Lumbar radiculopathy  M54.16 XR C-ARM NO REPORT    Epidural Steroid injection    methylPREDNISolone acetate (DEPO-MEDROL) injection 80 mg      Plan: No additional findings.   Meds & Orders:  Meds ordered this encounter  Medications   methylPREDNISolone acetate (DEPO-MEDROL) injection 80 mg    Orders Placed This Encounter  Procedures   XR C-ARM NO REPORT   Epidural Steroid injection    Follow-up: Return for visit to requesting provider as needed.   Procedures: No procedures performed  Lumbosacral Transforaminal Epidural Steroid Injection - Sub-Pedicular Approach with Fluoroscopic Guidance  Patient: Joanne Holmes      Date of Birth: [birth year] MRN: [id number] PCP: Aliene Beams, MD      Visit Date: 12/05/2022   Universal Protocol:    Date/Time: 12/05/2022  Consent Given By: the patient  Position: PRONE  Additional Comments: Vital signs were monitored before and after the procedure. Patient was prepped and draped in the usual sterile fashion. The correct patient, procedure, and site was verified.   Injection Procedure Details:   Procedure diagnoses: Lumbar radiculopathy [M54.16]     Meds Administered:  Meds ordered this encounter  Medications   methylPREDNISolone acetate (DEPO-MEDROL) injection 80 mg    Laterality: Bilateral  Location/Site: L4  Needle:5.0 in., 22 ga.  Short bevel or Quincke spinal needle  Needle Placement: Transforaminal  Findings:    -Comments: Excellent flow of contrast along the nerve, nerve root and into the epidural space.  Procedure Details: After squaring off the end-plates to get a true AP view, the C-arm was positioned so that an oblique view of the foramen as noted above was visualized. The target area is just inferior to the "nose of the scotty dog" or sub pedicular. The soft tissues overlying this structure were infiltrated with 2-3 ml. of 1% Lidocaine without Epinephrine.  The spinal needle was inserted toward the target using a "trajectory" view along the fluoroscope beam.  Under AP and lateral visualization, the needle was advanced so it did not puncture dura and was located close the 6 O'Clock position of the pedical in AP tracterory. Biplanar projections were used to confirm position. Aspiration was confirmed to be negative for CSF and/or blood. A 1-2 ml. volume of Isovue-250 was injected and flow of contrast was noted at each level. Radiographs were obtained for documentation purposes.   After attaining the desired flow of contrast documented above, a 0.5 to 1.0 ml test dose of 0.25% Marcaine was injected into each respective transforaminal space.  The patient was observed for 90 seconds post injection.  After no sensory deficits were reported, and normal lower extremity motor function was noted,   the above injectate was administered so  that equal amounts of the injectate were placed at each foramen (level) into the transforaminal epidural space.   Additional Comments:  No complications occurred Dressing: 2 x 2 sterile gauze and Band-Aid    Post-procedure details: Patient was observed during the procedure. Post-procedure  instructions were reviewed.  Patient left the clinic in stable condition.    Clinical History: Lumbar spine MRI dated 03/01/2016   Alignment: 4 mm anterolisthesis L3-4 with mild progression since  2013. 6.6 mm anterolisthesis L4-5 unchanged. Mild retrolisthesis  L1-2.     Vertebrae:  Negative for fracture or mass.  Normal bone marrow     Conus medullaris: Extends to the L1-2 level and appears normal.     Paraspinal and other soft tissues: No retroperitoneal mass or  adenopathy. Paraspinous muscles are symmetric with mild atrophy.     Disc levels:     L1-2: Mild retrolisthesis. Disc bulging and mild facet degeneration  without significant stenosis.     L2-3:  Mild disc and facet degeneration.  Mild spinal stenosis.     L3-4: Severe spinal stenosis which has progressed in the interval.  There is anterior listhesis of L3 on L4. Diffuse disc bulging.  Severe facet degeneration has progressed in the interval. There is  marked subarticular stenosis bilaterally as well as moderate  foraminal encroachment bilaterally.     L4-5: Severe spinal stenosis has progressed in the interval. Severe  facet degeneration. Grade 1 anterior listhesis unchanged.  Subarticular and foraminal encroachment bilaterally.     L5-S1: Severe disc degeneration with disc space narrowing and  endplate spurring. Prior laminectomy on the left. Diffuse endplate  scarring is present. There is soft tissue surrounding the left S1  nerve root which is unchanged most likely due to scar tissue. No  recurrent disc protrusion. Mild foraminal narrowing bilaterally due  to spurring.     Objective:  VS:  HT:    WT:   BMI:     BP:(!) 165/68  HR:69bpm  TEMP: ( )  RESP:  Physical Exam Vitals and nursing note reviewed.  Constitutional:      General: She is not in acute distress.    Appearance: Normal appearance. She is not ill-appearing.  HENT:     Head: Normocephalic and atraumatic.     Right Ear: External ear  normal.     Left Ear: External ear normal.  Eyes:     Extraocular Movements: Extraocular movements intact.  Cardiovascular:     Rate and Rhythm: Normal rate.     Pulses: Normal pulses.  Pulmonary:     Effort: Pulmonary effort is normal. No respiratory distress.  Abdominal:     General: There is no distension.     Palpations: Abdomen is soft.  Musculoskeletal:        General: Tenderness present.     Cervical back: Neck supple.     Right lower leg: No edema.     Left lower leg: No edema.     Comments: Patient has good distal strength with no pain over the greater trochanters.  No clonus or focal weakness.  Skin:    Findings: No erythema, lesion or rash.  Neurological:     General: No focal deficit present.     Mental Status: She is alert and oriented to person, place, and time.     Sensory: No sensory deficit.     Motor: No weakness or abnormal muscle tone.     Coordination: Coordination normal.  Psychiatric:  Mood and Affect: Mood normal.        Behavior: Behavior normal.      Imaging: No results found.

## 2022-12-11 NOTE — Procedures (Signed)
Lumbosacral Transforaminal Epidural Steroid Injection - Sub-Pedicular Approach with Fluoroscopic Guidance  Patient: Joanne Holmes      Date of Birth: 1945-12-26 MRN: [id number] PCP: Aliene Beams, MD      Visit Date: 12/05/2022   Universal Protocol:    Date/Time: 12/05/2022  Consent Given By: the patient  Position: PRONE  Additional Comments: Vital signs were monitored before and after the procedure. Patient was prepped and draped in the usual sterile fashion. The correct patient, procedure, and site was verified.   Injection Procedure Details:   Procedure diagnoses: Lumbar radiculopathy [M54.16]    Meds Administered:  Meds ordered this encounter  Medications   methylPREDNISolone acetate (DEPO-MEDROL) injection 80 mg    Laterality: Bilateral  Location/Site: L4  Needle:5.0 in., 22 ga.  Short bevel or Quincke spinal needle  Needle Placement: Transforaminal  Findings:    -Comments: Excellent flow of contrast along the nerve, nerve root and into the epidural space.  Procedure Details: After squaring off the end-plates to get a true AP view, the C-arm was positioned so that an oblique view of the foramen as noted above was visualized. The target area is just inferior to the "nose of the scotty dog" or sub pedicular. The soft tissues overlying this structure were infiltrated with 2-3 ml. of 1% Lidocaine without Epinephrine.  The spinal needle was inserted toward the target using a "trajectory" view along the fluoroscope beam.  Under AP and lateral visualization, the needle was advanced so it did not puncture dura and was located close the 6 O'Clock position of the pedical in AP tracterory. Biplanar projections were used to confirm position. Aspiration was confirmed to be negative for CSF and/or blood. A 1-2 ml. volume of Isovue-250 was injected and flow of contrast was noted at each level. Radiographs were obtained for documentation purposes.   After attaining the desired  flow of contrast documented above, a 0.5 to 1.0 ml test dose of 0.25% Marcaine was injected into each respective transforaminal space.  The patient was observed for 90 seconds post injection.  After no sensory deficits were reported, and normal lower extremity motor function was noted,   the above injectate was administered so that equal amounts of the injectate were placed at each foramen (level) into the transforaminal epidural space.   Additional Comments:  No complications occurred Dressing: 2 x 2 sterile gauze and Band-Aid    Post-procedure details: Patient was observed during the procedure. Post-procedure instructions were reviewed.  Patient left the clinic in stable condition.

## 2023-01-19 DIAGNOSIS — I110 Hypertensive heart disease with heart failure: Secondary | ICD-10-CM | POA: Diagnosis not present

## 2023-01-19 DIAGNOSIS — J449 Chronic obstructive pulmonary disease, unspecified: Secondary | ICD-10-CM | POA: Diagnosis not present

## 2023-01-19 DIAGNOSIS — G894 Chronic pain syndrome: Secondary | ICD-10-CM | POA: Diagnosis not present

## 2023-01-19 DIAGNOSIS — M48062 Spinal stenosis, lumbar region with neurogenic claudication: Secondary | ICD-10-CM | POA: Diagnosis not present

## 2023-01-19 DIAGNOSIS — Z23 Encounter for immunization: Secondary | ICD-10-CM | POA: Diagnosis not present

## 2023-01-19 DIAGNOSIS — E7800 Pure hypercholesterolemia, unspecified: Secondary | ICD-10-CM | POA: Diagnosis not present

## 2023-01-19 DIAGNOSIS — I509 Heart failure, unspecified: Secondary | ICD-10-CM | POA: Diagnosis not present

## 2023-01-19 DIAGNOSIS — M25561 Pain in right knee: Secondary | ICD-10-CM | POA: Diagnosis not present

## 2023-01-24 DIAGNOSIS — M25561 Pain in right knee: Secondary | ICD-10-CM | POA: Diagnosis not present

## 2023-01-24 DIAGNOSIS — G8929 Other chronic pain: Secondary | ICD-10-CM | POA: Diagnosis not present

## 2023-02-28 ENCOUNTER — Inpatient Hospital Stay: Admission: RE | Admit: 2023-02-28 | Payer: Medicare HMO | Source: Ambulatory Visit

## 2023-03-06 DIAGNOSIS — I129 Hypertensive chronic kidney disease with stage 1 through stage 4 chronic kidney disease, or unspecified chronic kidney disease: Secondary | ICD-10-CM | POA: Diagnosis not present

## 2023-03-09 ENCOUNTER — Inpatient Hospital Stay: Admission: RE | Admit: 2023-03-09 | Payer: Medicare HMO | Source: Ambulatory Visit

## 2023-05-09 ENCOUNTER — Telehealth: Payer: Self-pay

## 2023-05-09 ENCOUNTER — Other Ambulatory Visit: Payer: Self-pay

## 2023-05-09 ENCOUNTER — Encounter: Payer: Self-pay | Admitting: Orthopaedic Surgery

## 2023-05-09 ENCOUNTER — Ambulatory Visit (INDEPENDENT_AMBULATORY_CARE_PROVIDER_SITE_OTHER): Payer: Medicare HMO | Admitting: Orthopaedic Surgery

## 2023-05-09 DIAGNOSIS — M25561 Pain in right knee: Secondary | ICD-10-CM

## 2023-05-09 DIAGNOSIS — M1711 Unilateral primary osteoarthritis, right knee: Secondary | ICD-10-CM | POA: Diagnosis not present

## 2023-05-09 DIAGNOSIS — G8929 Other chronic pain: Secondary | ICD-10-CM

## 2023-05-09 MED ORDER — METHYLPREDNISOLONE ACETATE 40 MG/ML IJ SUSP
40.0000 mg | INTRAMUSCULAR | Status: AC | PRN
Start: 2023-05-09 — End: 2023-05-09
  Administered 2023-05-09: 40 mg via INTRA_ARTICULAR

## 2023-05-09 MED ORDER — LIDOCAINE HCL 1 % IJ SOLN
3.0000 mL | INTRAMUSCULAR | Status: AC | PRN
Start: 2023-05-09 — End: 2023-05-09
  Administered 2023-05-09: 3 mL

## 2023-05-09 NOTE — Telephone Encounter (Signed)
Right knee gel injection

## 2023-05-09 NOTE — Progress Notes (Signed)
The patient is an 78 year old female who comes in today with chronic right knee pain.  She had an injection several years ago with steroid in that right knee and it did help her for some time.  She does have chronic lumbar spine issues and is a regular patient of Dr. Alvester Morin for injections in her back.  She comes in today wanting to discuss treatment options for her arthritic right knee.  The last time we x-rayed her knee was in November 2023 and it did show arthritis in her knee then as well.  She does report occasional swelling and warmth with her right knee.  She gets pain in the back of the knee with weightbearing.  The knee does get stiff on her as well.  Examination of her right knee today shows no effusion but it is slightly warm.  She has good range of motion of the knee but there is varus malalignment with medial and lateral joint line tenderness as well as posterior tenderness and patellofemoral crepitation.  We talked about trying a steroid injection in her knee again today which she agreed to and tolerated well.  At this point I do feel that she is a good candidate for hyaluronic acid.  She is not interested in any type of surgery for her knee so I think that our neck step will be ordering hyaluronic acid for her right knee to treat the long-term pain from osteoarthritis.  We will see if we can get this ordered and approved for her.     Procedure Note  Patient: Joanne Holmes             Date of Birth: 1945/11/08           MRN: [id number]             Visit Date: 05/09/2023  Procedures: Visit Diagnoses:  1. Chronic pain of right knee   2. Unilateral primary osteoarthritis, right knee     Large Joint Inj: R knee on 05/09/2023 2:20 PM Indications: diagnostic evaluation and pain Details: 22 G 1.5 in needle, superolateral approach  Arthrogram: No  Medications: 3 mL lidocaine 1 %; 40 mg methylPREDNISolone acetate 40 MG/ML Outcome: tolerated well, no immediate  complications Procedure, treatment alternatives, risks and benefits explained, specific risks discussed. Consent was given by the patient. Immediately prior to procedure a time out was called to verify the correct patient, procedure, equipment, support staff and site/side marked as required. Patient was prepped and draped in the usual sterile fashion.

## 2023-05-25 DIAGNOSIS — I10 Essential (primary) hypertension: Secondary | ICD-10-CM | POA: Diagnosis not present

## 2023-05-25 DIAGNOSIS — M48062 Spinal stenosis, lumbar region with neurogenic claudication: Secondary | ICD-10-CM | POA: Diagnosis not present

## 2023-05-25 DIAGNOSIS — G894 Chronic pain syndrome: Secondary | ICD-10-CM | POA: Diagnosis not present

## 2023-06-01 NOTE — Telephone Encounter (Signed)
VOB submitted for durolane, right knee

## 2023-06-21 DIAGNOSIS — N1832 Chronic kidney disease, stage 3b: Secondary | ICD-10-CM | POA: Diagnosis not present

## 2023-06-25 DIAGNOSIS — N1832 Chronic kidney disease, stage 3b: Secondary | ICD-10-CM | POA: Diagnosis not present

## 2023-06-25 DIAGNOSIS — I129 Hypertensive chronic kidney disease with stage 1 through stage 4 chronic kidney disease, or unspecified chronic kidney disease: Secondary | ICD-10-CM | POA: Diagnosis not present

## 2023-07-18 ENCOUNTER — Other Ambulatory Visit: Payer: Self-pay

## 2023-07-25 ENCOUNTER — Ambulatory Visit: Admitting: Orthopaedic Surgery

## 2023-07-25 ENCOUNTER — Other Ambulatory Visit: Payer: Self-pay

## 2023-07-25 DIAGNOSIS — M1711 Unilateral primary osteoarthritis, right knee: Secondary | ICD-10-CM

## 2023-07-25 MED ORDER — SODIUM HYALURONATE 60 MG/3ML IX PRSY
60.0000 mg | PREFILLED_SYRINGE | INTRA_ARTICULAR | Status: AC | PRN
Start: 2023-07-25 — End: 2023-07-25
  Administered 2023-07-25: 60 mg via INTRA_ARTICULAR

## 2023-07-25 NOTE — Progress Notes (Signed)
   Procedure Note  Patient: MYAISHA LEDONNE             Date of Birth: 1945/07/01           MRN: [id number]             Visit Date: 07/25/2023  Procedures: Visit Diagnoses:  1. Unilateral primary osteoarthritis, right knee     Large Joint Inj: R knee on 07/25/2023 4:44 PM Indications: diagnostic evaluation and pain Details: 22 G 1.5 in needle, superolateral approach  Arthrogram: No  Medications: 60 mg Sodium Hyaluronate 60 MG/3ML Outcome: tolerated well, no immediate complications Procedure, treatment alternatives, risks and benefits explained, specific risks discussed. Consent was given by the patient. Immediately prior to procedure a time out was called to verify the correct patient, procedure, equipment, support staff and site/side marked as required. Patient was prepped and draped in the usual sterile fashion.    The patient is well-known to us .  She is an 78 year old female who comes in for a hyaluronic acid injection in her right knee to treat the pain from osteoarthritis.  She has had steroid injections in the past.  She does report pain in the back of her knee as well as knee swelling.  Examination of her right knee today shows a moderate effusion.  There is slight varus malalignment that is correctable and good range of motion the but it is painful.  She does have known osteoarthritis of the right knee previous x-rays in 2023 showed moderate narrowing of the medial joint line.  I was able to aspirate 30 to 40 cc of clear yellow fluid from her right knee that helped her feel better right away.  I then placed Durolane in the right knee without difficulty.  She knows to wait at least 6 months between these types of injections and we can always put a steroid injection in her right knee in 3 months if needed.  All question concerns were answered and addressed.   Lot #[id number]

## 2023-08-22 DIAGNOSIS — E7800 Pure hypercholesterolemia, unspecified: Secondary | ICD-10-CM | POA: Diagnosis not present

## 2023-08-22 DIAGNOSIS — E538 Deficiency of other specified B group vitamins: Secondary | ICD-10-CM | POA: Diagnosis not present

## 2023-08-22 DIAGNOSIS — D649 Anemia, unspecified: Secondary | ICD-10-CM | POA: Diagnosis not present

## 2023-08-22 DIAGNOSIS — I10 Essential (primary) hypertension: Secondary | ICD-10-CM | POA: Diagnosis not present

## 2023-08-25 DIAGNOSIS — J449 Chronic obstructive pulmonary disease, unspecified: Secondary | ICD-10-CM | POA: Diagnosis not present

## 2023-08-25 DIAGNOSIS — I10 Essential (primary) hypertension: Secondary | ICD-10-CM | POA: Diagnosis not present

## 2023-08-25 DIAGNOSIS — N184 Chronic kidney disease, stage 4 (severe): Secondary | ICD-10-CM | POA: Diagnosis not present

## 2023-08-25 DIAGNOSIS — I509 Heart failure, unspecified: Secondary | ICD-10-CM | POA: Diagnosis not present

## 2023-09-06 ENCOUNTER — Other Ambulatory Visit (HOSPITAL_BASED_OUTPATIENT_CLINIC_OR_DEPARTMENT_OTHER): Payer: Self-pay | Admitting: Family Medicine

## 2023-09-06 DIAGNOSIS — Z780 Asymptomatic menopausal state: Secondary | ICD-10-CM

## 2023-09-10 ENCOUNTER — Other Ambulatory Visit

## 2023-09-24 DIAGNOSIS — I10 Essential (primary) hypertension: Secondary | ICD-10-CM | POA: Diagnosis not present

## 2023-09-24 DIAGNOSIS — I509 Heart failure, unspecified: Secondary | ICD-10-CM | POA: Diagnosis not present

## 2023-09-24 DIAGNOSIS — N184 Chronic kidney disease, stage 4 (severe): Secondary | ICD-10-CM | POA: Diagnosis not present

## 2023-09-24 DIAGNOSIS — J449 Chronic obstructive pulmonary disease, unspecified: Secondary | ICD-10-CM | POA: Diagnosis not present

## 2023-10-23 ENCOUNTER — Other Ambulatory Visit: Payer: Self-pay | Admitting: Physical Medicine and Rehabilitation

## 2023-10-23 ENCOUNTER — Telehealth: Payer: Self-pay | Admitting: Physical Medicine and Rehabilitation

## 2023-10-23 DIAGNOSIS — M48062 Spinal stenosis, lumbar region with neurogenic claudication: Secondary | ICD-10-CM

## 2023-10-23 DIAGNOSIS — M5416 Radiculopathy, lumbar region: Secondary | ICD-10-CM

## 2023-10-23 DIAGNOSIS — M961 Postlaminectomy syndrome, not elsewhere classified: Secondary | ICD-10-CM

## 2023-10-23 NOTE — Telephone Encounter (Signed)
 Patient called and wants to schedule for back injection. CB#[phone]

## 2023-10-25 DIAGNOSIS — N184 Chronic kidney disease, stage 4 (severe): Secondary | ICD-10-CM | POA: Diagnosis not present

## 2023-10-25 DIAGNOSIS — I509 Heart failure, unspecified: Secondary | ICD-10-CM | POA: Diagnosis not present

## 2023-10-25 DIAGNOSIS — I10 Essential (primary) hypertension: Secondary | ICD-10-CM | POA: Diagnosis not present

## 2023-10-25 DIAGNOSIS — J449 Chronic obstructive pulmonary disease, unspecified: Secondary | ICD-10-CM | POA: Diagnosis not present

## 2023-11-14 ENCOUNTER — Other Ambulatory Visit

## 2023-11-15 ENCOUNTER — Ambulatory Visit: Admitting: Physical Medicine and Rehabilitation

## 2023-11-15 ENCOUNTER — Other Ambulatory Visit: Payer: Self-pay

## 2023-11-15 DIAGNOSIS — M5416 Radiculopathy, lumbar region: Secondary | ICD-10-CM

## 2023-11-15 MED ORDER — METHYLPREDNISOLONE ACETATE 40 MG/ML IJ SUSP
40.0000 mg | Freq: Once | INTRAMUSCULAR | Status: AC
Start: 2023-11-15 — End: 2023-11-15
  Administered 2023-11-15: 40 mg

## 2023-11-15 NOTE — Progress Notes (Signed)
 Pain Scale   Average Pain 4  Pain across lower back.  No radiating pain/numbness to legs.     +Driver, -BT, -Dye Allergies.

## 2023-11-15 NOTE — Progress Notes (Signed)
 [name] - 78 y.o. female MRN [id number]  Date of birth: 07-30-1945  Office Visit Note: Visit Date: 11/15/2023 PCP: Rolinda Millman, MD Referred by: Rolinda Millman, MD  Subjective: Chief Complaint  Patient presents with   Lower Back - Pain   HPI:  Joanne Holmes is a 78 y.o. female who comes in today for planned repeat Bilateral L4-5  Lumbar Transforaminal epidural steroid injection with fluoroscopic guidance.  The patient has failed conservative care including home exercise, medications, time and activity modification.  This injection will be diagnostic and hopefully therapeutic.  Please see requesting physician notes for further details and justification. Patient received more than 50% pain relief from prior injection.  She reports similar symptoms of low back pain bilaterally radicular leg pain at times down to the knees with walking and standing.  We have not seen her since September and she said the injection helped for 3 to 4 months but she has had a lot going on since that time.  She has been seeing Dr. Vernetta for her knees so she has been seen from a medical standpoint in the office.  She reports no new injuries or falls etc.  She is on chronic pain medications with hydrocodone  and tramadol .  She has known multilevel stenosis and postlaminectomy syndrome.  Would consider updating MRI at some point if needed depending on the results of the injection.  Referring: Duwaine Pouch, FNP   ROS Otherwise per HPI.  Assessment & Plan: Visit Diagnoses:    ICD-10-CM   1. Lumbar radiculopathy  M54.16 XR C-ARM NO REPORT    Epidural Steroid injection    methylPREDNISolone  acetate (DEPO-MEDROL ) injection 40 mg      Plan: No additional findings.   Meds & Orders:  Meds ordered this encounter  Medications   methylPREDNISolone  acetate (DEPO-MEDROL ) injection 40 mg    Orders Placed This Encounter  Procedures   XR C-ARM NO REPORT   Epidural Steroid injection    Follow-up: No  follow-ups on file.   Procedures: No procedures performed  Lumbosacral Transforaminal Epidural Steroid Injection - Sub-Pedicular Approach with Fluoroscopic Guidance  Patient: Joanne Holmes      Date of Birth: [birth year] MRN: [id number] PCP: Rolinda Millman, MD      Visit Date: 11/15/2023   Universal Protocol:    Date/Time: 11/15/2023  Consent Given By: the patient  Position: PRONE  Additional Comments: Vital signs were monitored before and after the procedure. Patient was prepped and draped in the usual sterile fashion. The correct patient, procedure, and site was verified.   Injection Procedure Details:   Procedure diagnoses: Lumbar radiculopathy [M54.16]    Meds Administered:  Meds ordered this encounter  Medications   methylPREDNISolone  acetate (DEPO-MEDROL ) injection 40 mg    Laterality: Bilateral  Location/Site: L4  Needle:5.0 in., 22 ga.  Short bevel or Quincke spinal needle  Needle Placement: Transforaminal  Findings:    -Comments: Excellent flow of contrast along the nerve, nerve root and into the epidural space.  Procedure Details: After squaring off the end-plates to get a true AP view, the C-arm was positioned so that an oblique view of the foramen as noted above was visualized. The target area is just inferior to the nose of the scotty dog or sub pedicular. The soft tissues overlying this structure were infiltrated with 2-3 ml. of 1% Lidocaine  without Epinephrine.  The spinal needle was inserted toward the target using a trajectory view along the fluoroscope beam.  Under AP and  lateral visualization, the needle was advanced so it did not puncture dura and was located close the 6 O'Clock position of the pedical in AP tracterory. Biplanar projections were used to confirm position. Aspiration was confirmed to be negative for CSF and/or blood. A 1-2 ml. volume of Isovue -250 was injected and flow of contrast was noted at each level. Radiographs were  obtained for documentation purposes.   After attaining the desired flow of contrast documented above, a 0.5 to 1.0 ml test dose of 0.25% Marcaine was injected into each respective transforaminal space.  The patient was observed for 90 seconds post injection.  After no sensory deficits were reported, and normal lower extremity motor function was noted,   the above injectate was administered so that equal amounts of the injectate were placed at each foramen (level) into the transforaminal epidural space.   Additional Comments:  The patient tolerated the procedure well Dressing: 2 x 2 sterile gauze and Band-Aid    Post-procedure details: Patient was observed during the procedure. Post-procedure instructions were reviewed.  Patient left the clinic in stable condition.    Clinical History: Lumbar spine MRI dated 03/01/2016   Alignment: 4 mm anterolisthesis L3-4 with mild progression since  2013. 6.6 mm anterolisthesis L4-5 unchanged. Mild retrolisthesis  L1-2.     Vertebrae:  Negative for fracture or mass.  Normal bone marrow     Conus medullaris: Extends to the L1-2 level and appears normal.     Paraspinal and other soft tissues: No retroperitoneal mass or  adenopathy. Paraspinous muscles are symmetric with mild atrophy.     Disc levels:     L1-2: Mild retrolisthesis. Disc bulging and mild facet degeneration  without significant stenosis.     L2-3:  Mild disc and facet degeneration.  Mild spinal stenosis.     L3-4: Severe spinal stenosis which has progressed in the interval.  There is anterior listhesis of L3 on L4. Diffuse disc bulging.  Severe facet degeneration has progressed in the interval. There is  marked subarticular stenosis bilaterally as well as moderate  foraminal encroachment bilaterally.     L4-5: Severe spinal stenosis has progressed in the interval. Severe  facet degeneration. Grade 1 anterior listhesis unchanged.  Subarticular and foraminal encroachment  bilaterally.     L5-S1: Severe disc degeneration with disc space narrowing and  endplate spurring. Prior laminectomy on the left. Diffuse endplate  scarring is present. There is soft tissue surrounding the left S1  nerve root which is unchanged most likely due to scar tissue. No  recurrent disc protrusion. Mild foraminal narrowing bilaterally due  to spurring.     Objective:  VS:  HT:    WT:   BMI:     BP:   HR: bpm  TEMP: ( )  RESP:  Physical Exam Vitals and nursing note reviewed.  Constitutional:      General: She is not in acute distress.    Appearance: Normal appearance. She is not ill-appearing.  HENT:     Head: Normocephalic and atraumatic.     Right Ear: External ear normal.     Left Ear: External ear normal.  Eyes:     Extraocular Movements: Extraocular movements intact.  Cardiovascular:     Rate and Rhythm: Normal rate.     Pulses: Normal pulses.  Pulmonary:     Effort: Pulmonary effort is normal. No respiratory distress.  Abdominal:     General: There is no distension.     Palpations: Abdomen is soft.  Musculoskeletal:        General: Tenderness present.     Cervical back: Neck supple.     Right lower leg: No edema.     Left lower leg: No edema.     Comments: Patient has good distal strength with no pain over the greater trochanters.  No clonus or focal weakness.  Skin:    Findings: No erythema, lesion or rash.  Neurological:     General: No focal deficit present.     Mental Status: She is alert and oriented to person, place, and time.     Sensory: No sensory deficit.     Motor: No weakness or abnormal muscle tone.     Coordination: Coordination normal.  Psychiatric:        Mood and Affect: Mood normal.        Behavior: Behavior normal.      Imaging: XR C-ARM NO REPORT Result Date: 11/15/2023 Please see Notes tab for imaging impression.

## 2023-11-15 NOTE — Procedures (Signed)
 Lumbosacral Transforaminal Epidural Steroid Injection - Sub-Pedicular Approach with Fluoroscopic Guidance  Patient: Joanne Holmes      Date of Birth: 1945/05/28 MRN: [id number] PCP: Rolinda Millman, MD      Visit Date: 11/15/2023   Universal Protocol:    Date/Time: 11/15/2023  Consent Given By: the patient  Position: PRONE  Additional Comments: Vital signs were monitored before and after the procedure. Patient was prepped and draped in the usual sterile fashion. The correct patient, procedure, and site was verified.   Injection Procedure Details:   Procedure diagnoses: Lumbar radiculopathy [M54.16]    Meds Administered:  Meds ordered this encounter  Medications   methylPREDNISolone  acetate (DEPO-MEDROL ) injection 40 mg    Laterality: Bilateral  Location/Site: L4  Needle:5.0 in., 22 ga.  Short bevel or Quincke spinal needle  Needle Placement: Transforaminal  Findings:    -Comments: Excellent flow of contrast along the nerve, nerve root and into the epidural space.  Procedure Details: After squaring off the end-plates to get a true AP view, the C-arm was positioned so that an oblique view of the foramen as noted above was visualized. The target area is just inferior to the nose of the scotty dog or sub pedicular. The soft tissues overlying this structure were infiltrated with 2-3 ml. of 1% Lidocaine  without Epinephrine.  The spinal needle was inserted toward the target using a trajectory view along the fluoroscope beam.  Under AP and lateral visualization, the needle was advanced so it did not puncture dura and was located close the 6 O'Clock position of the pedical in AP tracterory. Biplanar projections were used to confirm position. Aspiration was confirmed to be negative for CSF and/or blood. A 1-2 ml. volume of Isovue -250 was injected and flow of contrast was noted at each level. Radiographs were obtained for documentation purposes.   After attaining the desired  flow of contrast documented above, a 0.5 to 1.0 ml test dose of 0.25% Marcaine was injected into each respective transforaminal space.  The patient was observed for 90 seconds post injection.  After no sensory deficits were reported, and normal lower extremity motor function was noted,   the above injectate was administered so that equal amounts of the injectate were placed at each foramen (level) into the transforaminal epidural space.   Additional Comments:  The patient tolerated the procedure well Dressing: 2 x 2 sterile gauze and Band-Aid    Post-procedure details: Patient was observed during the procedure. Post-procedure instructions were reviewed.  Patient left the clinic in stable condition.

## 2023-11-25 DIAGNOSIS — J449 Chronic obstructive pulmonary disease, unspecified: Secondary | ICD-10-CM | POA: Diagnosis not present

## 2023-11-25 DIAGNOSIS — N184 Chronic kidney disease, stage 4 (severe): Secondary | ICD-10-CM | POA: Diagnosis not present

## 2023-11-25 DIAGNOSIS — I10 Essential (primary) hypertension: Secondary | ICD-10-CM | POA: Diagnosis not present

## 2023-11-25 DIAGNOSIS — I509 Heart failure, unspecified: Secondary | ICD-10-CM | POA: Diagnosis not present

## 2023-12-25 DIAGNOSIS — N184 Chronic kidney disease, stage 4 (severe): Secondary | ICD-10-CM | POA: Diagnosis not present

## 2023-12-25 DIAGNOSIS — J449 Chronic obstructive pulmonary disease, unspecified: Secondary | ICD-10-CM | POA: Diagnosis not present

## 2023-12-25 DIAGNOSIS — I509 Heart failure, unspecified: Secondary | ICD-10-CM | POA: Diagnosis not present

## 2023-12-25 DIAGNOSIS — I10 Essential (primary) hypertension: Secondary | ICD-10-CM | POA: Diagnosis not present

## 2024-01-21 DIAGNOSIS — N1832 Chronic kidney disease, stage 3b: Secondary | ICD-10-CM | POA: Diagnosis not present

## 2024-01-22 DIAGNOSIS — I129 Hypertensive chronic kidney disease with stage 1 through stage 4 chronic kidney disease, or unspecified chronic kidney disease: Secondary | ICD-10-CM | POA: Diagnosis not present

## 2024-01-22 DIAGNOSIS — E669 Obesity, unspecified: Secondary | ICD-10-CM | POA: Diagnosis not present

## 2024-01-22 DIAGNOSIS — N1832 Chronic kidney disease, stage 3b: Secondary | ICD-10-CM | POA: Diagnosis not present

## 2024-01-25 DIAGNOSIS — I509 Heart failure, unspecified: Secondary | ICD-10-CM | POA: Diagnosis not present

## 2024-01-25 DIAGNOSIS — J449 Chronic obstructive pulmonary disease, unspecified: Secondary | ICD-10-CM | POA: Diagnosis not present

## 2024-01-25 DIAGNOSIS — I10 Essential (primary) hypertension: Secondary | ICD-10-CM | POA: Diagnosis not present

## 2024-01-25 DIAGNOSIS — N184 Chronic kidney disease, stage 4 (severe): Secondary | ICD-10-CM | POA: Diagnosis not present

## 2024-01-28 ENCOUNTER — Encounter: Payer: Self-pay | Admitting: Radiology

## 2024-02-18 ENCOUNTER — Encounter: Payer: Self-pay | Admitting: Orthopaedic Surgery

## 2024-02-18 ENCOUNTER — Ambulatory Visit (INDEPENDENT_AMBULATORY_CARE_PROVIDER_SITE_OTHER): Admitting: Orthopaedic Surgery

## 2024-02-18 DIAGNOSIS — G8929 Other chronic pain: Secondary | ICD-10-CM

## 2024-02-18 DIAGNOSIS — M25561 Pain in right knee: Secondary | ICD-10-CM | POA: Diagnosis not present

## 2024-02-18 DIAGNOSIS — M1711 Unilateral primary osteoarthritis, right knee: Secondary | ICD-10-CM | POA: Diagnosis not present

## 2024-02-18 MED ORDER — METHYLPREDNISOLONE ACETATE 40 MG/ML IJ SUSP
40.0000 mg | INTRAMUSCULAR | Status: AC | PRN
  Administered 2024-02-18: 40 mg via INTRA_ARTICULAR

## 2024-02-18 MED ORDER — LIDOCAINE HCL 1 % IJ SOLN
3.0000 mL | INTRAMUSCULAR | Status: AC | PRN
  Administered 2024-02-18: 3 mL

## 2024-02-18 NOTE — Progress Notes (Signed)
 The patient is an 78 year old active female with known osteoarthritis of her right knee.  In February of this year she did have a steroid injection in her right knee followed by a hyaluronic acid injection in April of this year in the right knee to treat the pain from osteoarthritis.  There is been 7 months since that injection and she is interested in having that type of injection regimen again.  She has had no acute change in her medical status and she is not diabetic.  She does report daily right knee pain and discomfort.  On exam her right knee has varus malalignment and patellofemoral crepitation throughout the arc of motion of the knee with painful range of motion of her knee as well.  Today we did place a steroid injection in her right knee which she tolerated well.  We will work on ordering hyaluronic acid to treat the long-term pain from osteoarthritis in her right knee.  This patient is diagnosed with osteoarthritis of the knee(s).    Radiographs show evidence of joint space narrowing, osteophytes, subchondral sclerosis and/or subchondral cysts.  This patient has knee pain which interferes with functional and activities of daily living.    This patient has experienced inadequate response, adverse effects and/or intolerance with conservative treatments such as acetaminophen , NSAIDS, topical creams, physical therapy or regular exercise, knee bracing and/or weight loss.   This patient has experienced inadequate response or has a contraindication to intra articular steroid injections for at least 3 months.   This patient is not scheduled to have a total knee replacement within 6 months of starting treatment with viscosupplementation.     Procedure Note  Patient: Joanne Holmes             Date of Birth: [birth year]           MRN: [id number]             Visit Date: 02/18/2024  Procedures: Visit Diagnoses:  1. Unilateral primary osteoarthritis, right knee   2. Chronic pain of right  knee     Large Joint Inj: R knee on 02/18/2024 2:17 PM Indications: diagnostic evaluation and pain Details: 22 G 1.5 in needle, superolateral approach  Arthrogram: No  Medications: 3 mL lidocaine  1 %; 40 mg methylPREDNISolone  acetate 40 MG/ML Outcome: tolerated well, no immediate complications Procedure, treatment alternatives, risks and benefits explained, specific risks discussed. Consent was given by the patient. Immediately prior to procedure a time out was called to verify the correct patient, procedure, equipment, support staff and site/side marked as required. Patient was prepped and draped in the usual sterile fashion.

## 2024-02-24 DIAGNOSIS — J449 Chronic obstructive pulmonary disease, unspecified: Secondary | ICD-10-CM | POA: Diagnosis not present

## 2024-02-24 DIAGNOSIS — N184 Chronic kidney disease, stage 4 (severe): Secondary | ICD-10-CM | POA: Diagnosis not present

## 2024-02-24 DIAGNOSIS — I10 Essential (primary) hypertension: Secondary | ICD-10-CM | POA: Diagnosis not present

## 2024-02-24 DIAGNOSIS — I509 Heart failure, unspecified: Secondary | ICD-10-CM | POA: Diagnosis not present

## 2024-02-26 DIAGNOSIS — M48062 Spinal stenosis, lumbar region with neurogenic claudication: Secondary | ICD-10-CM | POA: Diagnosis not present

## 2024-02-26 DIAGNOSIS — J301 Allergic rhinitis due to pollen: Secondary | ICD-10-CM | POA: Diagnosis not present

## 2024-02-26 DIAGNOSIS — E7800 Pure hypercholesterolemia, unspecified: Secondary | ICD-10-CM | POA: Diagnosis not present

## 2024-02-26 DIAGNOSIS — I10 Essential (primary) hypertension: Secondary | ICD-10-CM | POA: Diagnosis not present

## 2024-02-26 DIAGNOSIS — N1832 Chronic kidney disease, stage 3b: Secondary | ICD-10-CM | POA: Diagnosis not present

## 2024-02-26 DIAGNOSIS — G894 Chronic pain syndrome: Secondary | ICD-10-CM | POA: Diagnosis not present

## 2024-02-26 DIAGNOSIS — J449 Chronic obstructive pulmonary disease, unspecified: Secondary | ICD-10-CM | POA: Diagnosis not present

## 2024-02-26 DIAGNOSIS — K219 Gastro-esophageal reflux disease without esophagitis: Secondary | ICD-10-CM | POA: Diagnosis not present

## 2024-03-26 ENCOUNTER — Encounter: Payer: Self-pay | Admitting: Orthopaedic Surgery

## 2024-03-26 ENCOUNTER — Ambulatory Visit (INDEPENDENT_AMBULATORY_CARE_PROVIDER_SITE_OTHER): Admitting: Orthopaedic Surgery

## 2024-03-26 DIAGNOSIS — M1711 Unilateral primary osteoarthritis, right knee: Secondary | ICD-10-CM | POA: Diagnosis not present

## 2024-03-26 MED ORDER — SODIUM HYALURONATE 60 MG/3ML IX PRSY
60.0000 mg | PREFILLED_SYRINGE | INTRA_ARTICULAR | Status: AC | PRN
  Administered 2024-03-26: 60 mg via INTRA_ARTICULAR

## 2024-03-26 NOTE — Progress Notes (Signed)
" ° °  Procedure Note  Patient: [name]             Date of Birth: 09-03-1945           MRN: [id number]             Visit Date: 03/26/2024  Procedures: Visit Diagnoses:  1. Unilateral primary osteoarthritis, right knee     Large Joint Inj: R knee on 03/26/2024 1:43 PM Indications: diagnostic evaluation and pain Details: 22 G 1.5 in needle, superolateral approach  Arthrogram: No  Medications: 60 mg Sodium Hyaluronate 60 MG/3ML Outcome: tolerated well, no immediate complications Procedure, treatment alternatives, risks and benefits explained, specific risks discussed. Consent was given by the patient. Immediately prior to procedure a time out was called to verify the correct patient, procedure, equipment, support staff and site/side marked as required. Patient was prepped and draped in the usual sterile fashion.    The patient is an 78 year old female who is here today for a scheduled hyaluronic acid injection in her right knee to treat the chronic pain from osteoarthritis.  She has tried and failed other conservative treatment measures including steroid injections.  Today's injection is with Durolane.  She has had an injection just like this in the past.  She did tolerate the hyaluronic acid injection today.  She knows in 3 months we can always put a steroid injection in her knee in the interim if need be and in 6 months to reinject hyaluronic acid but only if she needs it.  All questions and concerns were addressed and answered.  Lot #[id number]    "
